# Patient Record
Sex: Male | Born: 1935 | Race: Black or African American | Hispanic: Yes | Marital: Married | Smoking: Former smoker
Health system: Southern US, Community
[De-identification: ages and names within clinical notes are randomized; demographics above are authoritative.]

## PROBLEM LIST (undated history)

## (undated) DIAGNOSIS — N529 Male erectile dysfunction, unspecified: Secondary | ICD-10-CM

## (undated) DIAGNOSIS — M1710 Unilateral primary osteoarthritis, unspecified knee: Secondary | ICD-10-CM

## (undated) DIAGNOSIS — I739 Peripheral vascular disease, unspecified: Secondary | ICD-10-CM

## (undated) DIAGNOSIS — Z9181 History of falling: Secondary | ICD-10-CM

## (undated) DIAGNOSIS — H5212 Myopia, left eye: Secondary | ICD-10-CM

## (undated) DIAGNOSIS — H524 Presbyopia: Secondary | ICD-10-CM

## (undated) DIAGNOSIS — N3941 Urge incontinence: Secondary | ICD-10-CM

## (undated) DIAGNOSIS — R7611 Nonspecific reaction to tuberculin skin test without active tuberculosis: Secondary | ICD-10-CM

## (undated) DIAGNOSIS — I5022 Chronic systolic (congestive) heart failure: Secondary | ICD-10-CM

## (undated) DIAGNOSIS — H40013 Open angle with borderline findings, low risk, bilateral: Secondary | ICD-10-CM

## (undated) DIAGNOSIS — E785 Hyperlipidemia, unspecified: Secondary | ICD-10-CM

## (undated) DIAGNOSIS — E0842 Diabetes mellitus due to underlying condition with diabetic polyneuropathy: Secondary | ICD-10-CM

## (undated) DIAGNOSIS — I10 Essential (primary) hypertension: Secondary | ICD-10-CM

## (undated) DIAGNOSIS — E118 Type 2 diabetes mellitus with unspecified complications: Principal | ICD-10-CM

## (undated) DIAGNOSIS — Z7409 Other reduced mobility: Secondary | ICD-10-CM

## (undated) DIAGNOSIS — E669 Obesity, unspecified: Secondary | ICD-10-CM

## (undated) DIAGNOSIS — R296 Repeated falls: Secondary | ICD-10-CM

## (undated) DIAGNOSIS — R269 Unspecified abnormalities of gait and mobility: Secondary | ICD-10-CM

## (undated) DIAGNOSIS — I351 Nonrheumatic aortic (valve) insufficiency: Secondary | ICD-10-CM

## (undated) DIAGNOSIS — E1165 Type 2 diabetes mellitus with hyperglycemia: Principal | ICD-10-CM

## (undated) DIAGNOSIS — H52229 Regular astigmatism, unspecified eye: Secondary | ICD-10-CM

## (undated) DIAGNOSIS — R809 Proteinuria, unspecified: Secondary | ICD-10-CM

## (undated) DIAGNOSIS — K223 Perforation of esophagus: Secondary | ICD-10-CM

## (undated) DIAGNOSIS — L97509 Non-pressure chronic ulcer of other part of unspecified foot with unspecified severity: Secondary | ICD-10-CM

## (undated) DIAGNOSIS — S335XXA Sprain of ligaments of lumbar spine, initial encounter: Secondary | ICD-10-CM

## (undated) DIAGNOSIS — G609 Hereditary and idiopathic neuropathy, unspecified: Secondary | ICD-10-CM

## (undated) DIAGNOSIS — M48061 Spinal stenosis, lumbar region without neurogenic claudication: Secondary | ICD-10-CM

## (undated) DIAGNOSIS — M4646 Discitis, unspecified, lumbar region: Secondary | ICD-10-CM

## (undated) DIAGNOSIS — E119 Type 2 diabetes mellitus without complications: Principal | ICD-10-CM

## (undated) DIAGNOSIS — R609 Edema, unspecified: Secondary | ICD-10-CM

## (undated) DIAGNOSIS — I5020 Unspecified systolic (congestive) heart failure: Secondary | ICD-10-CM

## (undated) DIAGNOSIS — R2689 Other abnormalities of gait and mobility: Principal | ICD-10-CM

## (undated) DIAGNOSIS — H02402 Unspecified ptosis of left eyelid: Secondary | ICD-10-CM

## (undated) DIAGNOSIS — K6812 Psoas muscle abscess: Principal | ICD-10-CM

## (undated) DIAGNOSIS — R39198 Other difficulties with micturition: Secondary | ICD-10-CM

## (undated) HISTORY — DX: Unspecified systolic (congestive) heart failure: I50.20

## (undated) HISTORY — DX: Sprain of ligaments of lumbar spine, initial encounter: S33.5XXA

## (undated) HISTORY — DX: Peripheral vascular disease, unspecified: I73.9

## (undated) HISTORY — DX: Nonspecific reaction to tuberculin skin test without active tuberculosis: R76.11

## (undated) HISTORY — DX: Male erectile dysfunction, unspecified: N52.9

## (undated) HISTORY — DX: Spinal stenosis, lumbar region without neurogenic claudication: M48.061

## (undated) HISTORY — DX: Unspecified ptosis of left eyelid: H02.402

## (undated) HISTORY — DX: Hyperlipidemia, unspecified: E78.5

## (undated) HISTORY — DX: Open angle with borderline findings, low risk, bilateral: H40.013

## (undated) HISTORY — DX: Type 2 diabetes mellitus with hyperglycemia: E11.65

## (undated) HISTORY — DX: Other reduced mobility: Z74.09

## (undated) HISTORY — DX: Chronic systolic (congestive) heart failure: I50.22

## (undated) HISTORY — DX: Type 2 diabetes mellitus without complications: E11.9

## (undated) HISTORY — DX: Regular astigmatism, unspecified eye: H52.229

## (undated) HISTORY — DX: Other difficulties with micturition: R39.198

## (undated) HISTORY — DX: Nonrheumatic aortic (valve) insufficiency: I35.1

## (undated) HISTORY — DX: Obesity, unspecified: E66.9

## (undated) HISTORY — DX: Proteinuria, unspecified: R80.9

## (undated) HISTORY — DX: Type 2 diabetes mellitus with unspecified complications: E11.8

## (undated) HISTORY — DX: Edema, unspecified: R60.9

## (undated) HISTORY — DX: Essential (primary) hypertension: I10

## (undated) HISTORY — DX: Unilateral primary osteoarthritis, unspecified knee: M17.10

## (undated) HISTORY — DX: Discitis, unspecified, lumbar region: M46.46

## (undated) HISTORY — DX: Presbyopia: H52.4

## (undated) HISTORY — DX: Other abnormalities of gait and mobility: R26.89

## (undated) HISTORY — DX: Non-pressure chronic ulcer of other part of unspecified foot with unspecified severity: L97.509

## (undated) HISTORY — DX: History of falling: Z91.81

## (undated) HISTORY — PX: CATARACT EXTRACTION: SUR2

## (undated) HISTORY — DX: Urge incontinence: N39.41

## (undated) HISTORY — DX: Diabetes mellitus due to underlying condition with diabetic polyneuropathy: E08.42

## (undated) HISTORY — DX: Repeated falls: R29.6

## (undated) HISTORY — DX: Hereditary and idiopathic neuropathy, unspecified: G60.9

## (undated) HISTORY — DX: Perforation of esophagus: K22.3

## (undated) HISTORY — DX: Myopia, left eye: H52.12

## (undated) HISTORY — DX: Psoas muscle abscess: K68.12

## (undated) HISTORY — DX: Unspecified abnormalities of gait and mobility: R26.9

---

## 1991-06-11 DIAGNOSIS — R7611 Nonspecific reaction to tuberculin skin test without active tuberculosis: Secondary | ICD-10-CM

## 1991-06-11 DIAGNOSIS — K223 Perforation of esophagus: Secondary | ICD-10-CM

## 1991-06-11 HISTORY — PX: THORACOTOMY: SUR1349

## 1991-06-11 HISTORY — PX: SPINAL CORD DECOMPRESSION: SHX97

## 1991-06-11 HISTORY — PX: NECK SURGERY: SHX720

## 1991-06-11 HISTORY — DX: Perforation of esophagus: K22.3

## 1991-06-11 HISTORY — PX: OTHER SURGICAL HISTORY: SHX169

## 1991-06-11 HISTORY — DX: Nonspecific reaction to tuberculin skin test without active tuberculosis: R76.11

## 1991-06-11 HISTORY — PX: JEJUNOSTOMY FEEDING TUBE: SUR737

## 1997-11-03 ENCOUNTER — Encounter: Admission: RE | Admit: 1997-11-03 | Discharge: 1997-11-03 | Payer: Self-pay | Admitting: Family Medicine

## 1997-11-29 ENCOUNTER — Encounter: Admission: RE | Admit: 1997-11-29 | Discharge: 1997-11-29 | Payer: Self-pay | Admitting: Family Medicine

## 1997-12-01 ENCOUNTER — Encounter: Admission: RE | Admit: 1997-12-01 | Discharge: 1997-12-01 | Payer: Self-pay | Admitting: Family Medicine

## 1998-03-14 ENCOUNTER — Encounter: Admission: RE | Admit: 1998-03-14 | Discharge: 1998-03-14 | Payer: Self-pay | Admitting: Family Medicine

## 1998-10-19 ENCOUNTER — Encounter: Admission: RE | Admit: 1998-10-19 | Discharge: 1998-10-19 | Payer: Self-pay | Admitting: Family Medicine

## 1999-04-06 ENCOUNTER — Encounter: Admission: RE | Admit: 1999-04-06 | Discharge: 1999-04-06 | Payer: Self-pay | Admitting: Sports Medicine

## 1999-06-07 ENCOUNTER — Encounter: Admission: RE | Admit: 1999-06-07 | Discharge: 1999-06-07 | Payer: Self-pay | Admitting: Family Medicine

## 2000-04-08 ENCOUNTER — Encounter: Admission: RE | Admit: 2000-04-08 | Discharge: 2000-04-08 | Payer: Self-pay | Admitting: Family Medicine

## 2000-07-07 ENCOUNTER — Encounter: Admission: RE | Admit: 2000-07-07 | Discharge: 2000-07-07 | Payer: Self-pay | Admitting: Family Medicine

## 2001-04-01 ENCOUNTER — Encounter: Admission: RE | Admit: 2001-04-01 | Discharge: 2001-04-01 | Payer: Self-pay | Admitting: Family Medicine

## 2001-04-08 ENCOUNTER — Encounter: Admission: RE | Admit: 2001-04-08 | Discharge: 2001-04-08 | Payer: Self-pay | Admitting: Family Medicine

## 2001-08-03 ENCOUNTER — Encounter: Admission: RE | Admit: 2001-08-03 | Discharge: 2001-08-03 | Payer: Self-pay | Admitting: Family Medicine

## 2001-08-14 ENCOUNTER — Encounter: Admission: RE | Admit: 2001-08-14 | Discharge: 2001-11-12 | Payer: Self-pay | Admitting: Infectious Diseases

## 2001-08-20 ENCOUNTER — Encounter: Admission: RE | Admit: 2001-08-20 | Discharge: 2001-08-20 | Payer: Self-pay | Admitting: Family Medicine

## 2002-03-22 ENCOUNTER — Encounter: Admission: RE | Admit: 2002-03-22 | Discharge: 2002-03-22 | Payer: Self-pay | Admitting: Family Medicine

## 2002-10-21 ENCOUNTER — Encounter: Admission: RE | Admit: 2002-10-21 | Discharge: 2002-10-21 | Payer: Self-pay | Admitting: Family Medicine

## 2003-04-06 ENCOUNTER — Encounter: Admission: RE | Admit: 2003-04-06 | Discharge: 2003-04-06 | Payer: Self-pay | Admitting: Family Medicine

## 2003-10-20 ENCOUNTER — Encounter: Admission: RE | Admit: 2003-10-20 | Discharge: 2003-10-20 | Payer: Self-pay | Admitting: Family Medicine

## 2004-03-26 ENCOUNTER — Ambulatory Visit: Payer: Self-pay | Admitting: Sports Medicine

## 2004-04-05 ENCOUNTER — Ambulatory Visit: Payer: Self-pay | Admitting: Family Medicine

## 2005-01-24 ENCOUNTER — Ambulatory Visit: Payer: Self-pay | Admitting: Family Medicine

## 2005-04-22 ENCOUNTER — Ambulatory Visit: Payer: Self-pay | Admitting: Family Medicine

## 2006-02-24 ENCOUNTER — Ambulatory Visit: Payer: Self-pay | Admitting: Family Medicine

## 2006-08-07 DIAGNOSIS — G609 Hereditary and idiopathic neuropathy, unspecified: Secondary | ICD-10-CM

## 2006-08-07 DIAGNOSIS — N529 Male erectile dysfunction, unspecified: Secondary | ICD-10-CM | POA: Insufficient documentation

## 2006-08-07 DIAGNOSIS — E669 Obesity, unspecified: Secondary | ICD-10-CM | POA: Insufficient documentation

## 2006-08-07 DIAGNOSIS — R809 Proteinuria, unspecified: Secondary | ICD-10-CM | POA: Insufficient documentation

## 2006-08-07 DIAGNOSIS — I10 Essential (primary) hypertension: Secondary | ICD-10-CM

## 2006-08-07 DIAGNOSIS — E0842 Diabetes mellitus due to underlying condition with diabetic polyneuropathy: Secondary | ICD-10-CM

## 2006-08-07 DIAGNOSIS — E119 Type 2 diabetes mellitus without complications: Secondary | ICD-10-CM

## 2006-08-07 DIAGNOSIS — E7800 Pure hypercholesterolemia, unspecified: Secondary | ICD-10-CM | POA: Insufficient documentation

## 2006-08-07 DIAGNOSIS — IMO0002 Reserved for concepts with insufficient information to code with codable children: Secondary | ICD-10-CM

## 2006-08-07 DIAGNOSIS — E785 Hyperlipidemia, unspecified: Secondary | ICD-10-CM

## 2006-08-07 DIAGNOSIS — E1165 Type 2 diabetes mellitus with hyperglycemia: Secondary | ICD-10-CM

## 2006-08-07 DIAGNOSIS — M1710 Unilateral primary osteoarthritis, unspecified knee: Secondary | ICD-10-CM | POA: Insufficient documentation

## 2006-08-07 HISTORY — DX: Type 2 diabetes mellitus with hyperglycemia: E11.65

## 2006-08-07 HISTORY — DX: Diabetes mellitus due to underlying condition with diabetic polyneuropathy: E08.42

## 2006-08-07 HISTORY — DX: Reserved for concepts with insufficient information to code with codable children: IMO0002

## 2006-08-07 HISTORY — DX: Type 2 diabetes mellitus without complications: E11.9

## 2006-08-07 HISTORY — DX: Proteinuria, unspecified: R80.9

## 2006-08-07 HISTORY — DX: Essential (primary) hypertension: I10

## 2006-08-07 HISTORY — DX: Obesity, unspecified: E66.9

## 2006-08-07 HISTORY — DX: Male erectile dysfunction, unspecified: N52.9

## 2006-08-07 HISTORY — DX: Hereditary and idiopathic neuropathy, unspecified: G60.9

## 2006-08-07 HISTORY — DX: Hyperlipidemia, unspecified: E78.5

## 2007-01-02 ENCOUNTER — Encounter: Payer: Self-pay | Admitting: Family Medicine

## 2007-03-12 ENCOUNTER — Ambulatory Visit: Payer: Self-pay | Admitting: Family Medicine

## 2007-03-12 LAB — CONVERTED CEMR LAB

## 2007-03-13 DIAGNOSIS — N183 Chronic kidney disease, stage 3 unspecified: Secondary | ICD-10-CM | POA: Insufficient documentation

## 2007-03-13 LAB — CONVERTED CEMR LAB
BUN: 20 mg/dL (ref 6–23)
Calcium: 9.3 mg/dL (ref 8.4–10.5)

## 2007-10-27 ENCOUNTER — Telehealth: Payer: Self-pay | Admitting: Family Medicine

## 2007-10-29 ENCOUNTER — Telehealth: Payer: Self-pay | Admitting: Family Medicine

## 2008-01-29 ENCOUNTER — Telehealth: Payer: Self-pay | Admitting: *Deleted

## 2008-03-04 ENCOUNTER — Telehealth: Payer: Self-pay | Admitting: *Deleted

## 2008-03-04 ENCOUNTER — Ambulatory Visit: Payer: Self-pay | Admitting: Family Medicine

## 2008-03-25 ENCOUNTER — Telehealth: Payer: Self-pay | Admitting: Family Medicine

## 2008-04-04 ENCOUNTER — Ambulatory Visit: Payer: Self-pay | Admitting: Family Medicine

## 2008-04-04 LAB — CONVERTED CEMR LAB
Creatinine, Ser: 1.43 mg/dL (ref 0.40–1.50)
Hgb A1c MFr Bld: 6.8 %
Potassium: 4.7 meq/L (ref 3.5–5.3)
Sodium: 139 meq/L (ref 135–145)
Total CHOL/HDL Ratio: 2.8
VLDL: 24 mg/dL (ref 0–40)

## 2008-04-05 ENCOUNTER — Encounter: Payer: Self-pay | Admitting: Family Medicine

## 2008-04-29 ENCOUNTER — Telehealth (INDEPENDENT_AMBULATORY_CARE_PROVIDER_SITE_OTHER): Payer: Self-pay | Admitting: Family Medicine

## 2008-06-10 HISTORY — PX: LUMBAR EPIDURAL INJECTION: SHX1980

## 2008-06-29 ENCOUNTER — Encounter: Payer: Self-pay | Admitting: Family Medicine

## 2008-07-19 ENCOUNTER — Ambulatory Visit: Payer: Self-pay | Admitting: Family Medicine

## 2008-07-19 ENCOUNTER — Telehealth: Payer: Self-pay | Admitting: Family Medicine

## 2008-07-19 DIAGNOSIS — S335XXA Sprain of ligaments of lumbar spine, initial encounter: Secondary | ICD-10-CM

## 2008-07-19 HISTORY — DX: Sprain of ligaments of lumbar spine, initial encounter: S33.5XXA

## 2008-08-01 ENCOUNTER — Telehealth: Payer: Self-pay | Admitting: Family Medicine

## 2008-08-01 ENCOUNTER — Ambulatory Visit: Payer: Self-pay | Admitting: Family Medicine

## 2008-08-01 DIAGNOSIS — R269 Unspecified abnormalities of gait and mobility: Secondary | ICD-10-CM

## 2008-08-01 DIAGNOSIS — R609 Edema, unspecified: Secondary | ICD-10-CM

## 2008-08-01 HISTORY — DX: Edema, unspecified: R60.9

## 2008-08-01 HISTORY — DX: Unspecified abnormalities of gait and mobility: R26.9

## 2008-08-01 LAB — CONVERTED CEMR LAB
ALT: 18 units/L (ref 0–53)
Albumin: 4.1 g/dL (ref 3.5–5.2)
Alkaline Phosphatase: 63 units/L (ref 39–117)
BUN: 21 mg/dL (ref 6–23)
Calcium: 8.7 mg/dL (ref 8.4–10.5)
Chloride: 104 meq/L (ref 96–112)
Creatinine, Ser: 1.54 mg/dL — ABNORMAL HIGH (ref 0.40–1.50)
Glucose, Bld: 177 mg/dL — ABNORMAL HIGH (ref 70–99)
HCT: 43 % (ref 39.0–52.0)
MCHC: 34 g/dL (ref 30.0–36.0)
MCV: 81.3 fL (ref 78.0–100.0)
PSA: 0.61 ng/mL (ref 0.10–4.00)
Platelets: 147 10*3/uL — ABNORMAL LOW (ref 150–400)
Potassium: 4.3 meq/L (ref 3.5–5.3)
RBC: 5.29 M/uL (ref 4.22–5.81)
RDW: 14.7 % (ref 11.5–15.5)
Sed Rate: 4 mm/hr (ref 0–16)
Total Bilirubin: 0.7 mg/dL (ref 0.3–1.2)
Total Protein: 6.6 g/dL (ref 6.0–8.3)

## 2008-08-02 ENCOUNTER — Telehealth: Payer: Self-pay | Admitting: *Deleted

## 2008-08-03 ENCOUNTER — Telehealth: Payer: Self-pay | Admitting: Family Medicine

## 2008-08-04 ENCOUNTER — Ambulatory Visit: Payer: Self-pay | Admitting: Family Medicine

## 2008-09-15 ENCOUNTER — Telehealth: Payer: Self-pay | Admitting: Family Medicine

## 2009-02-14 ENCOUNTER — Ambulatory Visit: Payer: Self-pay | Admitting: Family Medicine

## 2009-02-14 ENCOUNTER — Encounter: Payer: Self-pay | Admitting: Family Medicine

## 2009-02-17 ENCOUNTER — Encounter: Admission: RE | Admit: 2009-02-17 | Discharge: 2009-02-17 | Payer: Self-pay | Admitting: Family Medicine

## 2009-02-20 ENCOUNTER — Telehealth: Payer: Self-pay | Admitting: Family Medicine

## 2009-02-22 ENCOUNTER — Telehealth: Payer: Self-pay | Admitting: Family Medicine

## 2009-02-24 ENCOUNTER — Telehealth: Payer: Self-pay | Admitting: Family Medicine

## 2009-03-03 ENCOUNTER — Encounter: Admission: RE | Admit: 2009-03-03 | Discharge: 2009-03-03 | Payer: Self-pay | Admitting: Family Medicine

## 2009-03-06 ENCOUNTER — Telehealth: Payer: Self-pay | Admitting: Family Medicine

## 2009-03-14 ENCOUNTER — Encounter: Payer: Self-pay | Admitting: Family Medicine

## 2009-03-17 ENCOUNTER — Encounter: Admission: RE | Admit: 2009-03-17 | Discharge: 2009-03-17 | Payer: Self-pay | Admitting: Family Medicine

## 2009-03-30 ENCOUNTER — Telehealth: Payer: Self-pay | Admitting: Family Medicine

## 2009-04-27 ENCOUNTER — Encounter: Admission: RE | Admit: 2009-04-27 | Discharge: 2009-04-27 | Payer: Self-pay | Admitting: Family Medicine

## 2009-05-01 ENCOUNTER — Ambulatory Visit: Payer: Self-pay | Admitting: Family Medicine

## 2009-05-01 LAB — CONVERTED CEMR LAB
Alkaline Phosphatase: 66 units/L (ref 39–117)
BUN: 21 mg/dL (ref 6–23)
Calcium: 9.3 mg/dL (ref 8.4–10.5)
Chloride: 101 meq/L (ref 96–112)
Creatinine, Ser: 1.37 mg/dL (ref 0.40–1.50)
Potassium: 4.2 meq/L (ref 3.5–5.3)
Sodium: 139 meq/L (ref 135–145)

## 2009-05-02 ENCOUNTER — Encounter: Payer: Self-pay | Admitting: Family Medicine

## 2009-06-16 ENCOUNTER — Ambulatory Visit: Payer: Self-pay | Admitting: Family Medicine

## 2009-06-17 ENCOUNTER — Encounter: Payer: Self-pay | Admitting: Family Medicine

## 2009-06-27 ENCOUNTER — Ambulatory Visit: Payer: Self-pay | Admitting: Family Medicine

## 2009-07-04 ENCOUNTER — Ambulatory Visit: Payer: Self-pay | Admitting: Family Medicine

## 2009-09-13 ENCOUNTER — Telehealth: Payer: Self-pay | Admitting: Family Medicine

## 2010-01-01 ENCOUNTER — Telehealth: Payer: Self-pay | Admitting: Family Medicine

## 2010-04-02 ENCOUNTER — Ambulatory Visit: Payer: Self-pay | Admitting: Family Medicine

## 2010-04-02 ENCOUNTER — Telehealth: Payer: Self-pay | Admitting: Family Medicine

## 2010-04-02 LAB — CONVERTED CEMR LAB
ALT: 15 units/L (ref 0–53)
AST: 16 units/L (ref 0–37)
Albumin: 3.7 g/dL (ref 3.5–5.2)
Anti Nuclear Antibody(ANA): NEGATIVE
BUN: 25 mg/dL — ABNORMAL HIGH (ref 6–23)
Beta Globulin: 5 % (ref 4.7–7.2)
Creatinine, Ser: 1.28 mg/dL (ref 0.40–1.50)
Glucose, Bld: 162 mg/dL — ABNORMAL HIGH (ref 70–99)
Hgb A1c MFr Bld: 5.2 %
Potassium: 4.4 meq/L (ref 3.5–5.3)
Total Bilirubin: 0.7 mg/dL (ref 0.3–1.2)
Vitamin B-12: 1164 pg/mL — ABNORMAL HIGH (ref 211–911)

## 2010-04-03 ENCOUNTER — Encounter: Payer: Self-pay | Admitting: Family Medicine

## 2010-04-05 ENCOUNTER — Telehealth: Payer: Self-pay | Admitting: Family Medicine

## 2010-05-14 ENCOUNTER — Ambulatory Visit: Payer: Self-pay | Admitting: Family Medicine

## 2010-05-18 ENCOUNTER — Telehealth: Payer: Self-pay | Admitting: Family Medicine

## 2010-06-14 ENCOUNTER — Encounter: Payer: Self-pay | Admitting: Family Medicine

## 2010-06-18 ENCOUNTER — Encounter: Payer: Self-pay | Admitting: Family Medicine

## 2010-06-26 ENCOUNTER — Encounter
Admission: RE | Admit: 2010-06-26 | Discharge: 2010-07-10 | Payer: Self-pay | Source: Home / Self Care | Attending: Family Medicine | Admitting: Family Medicine

## 2010-07-10 NOTE — Progress Notes (Signed)
Summary: Check-in about tolerance to gabapentin, discuss abnormal TSH.  Phone Note Outgoing Call   Call placed by: Tawanna Cooler McDiarmid MD,  April 05, 2010 4:37 PM Call placed to: Patient Summary of Call: Carlos Andrews says he is tolerating the gabapentin at one tablet at night, but because of the way it makes him feel, he has not increased beyond one tablet at night.  He reports the pins and needles sensations in his legs is improved.  We discussed the slightly low TSH and that I will want to check some other Thyroid tests when he comes in for his next OV with me.  He was agreeable to this additional testing.  Initial call taken by: Tawanna Cooler McDiarmid MD,  April 05, 2010 4:40 PM

## 2010-07-10 NOTE — Assessment & Plan Note (Signed)
Summary: ABIs - Rx Clinic   Vital Signs:  Patient profile:   77 year old male Height:      65.5 inches Weight:      172 pounds BMI:     28.29 Pulse rate:   87 / minute BP sitting:   170 / 98  (right arm)  Primary Care Provider:  Tawanna Cooler McDiarmid MD   History of Present Illness: 77 yo AAM reports to Rx clinic with his wife for ABIs.  Recently seen for diabetic right toe ulcer and referred.    Pt and wife are able to give detailed history of medications.  Reports using his OneTouch meter until recently, which became defective and switched to Relion meter system.  Wife presented glucose log dated from 1/8 until this morning.  Has been adjusting glimepiride between 1/2 to 1 tablet two times a day based on daily CBGs.      Current Medications (verified): 1)  Aspirin Ec 81 Mg Tbec (Aspirin) .... Take 1 Tablet By Mouth Once A Day 2)  Clonidine Hcl 0.2 Mg Tabs (Clonidine Hcl) .... Take 1 Tablet By Mouth Twice A Day 3)  Glimepiride 2 Mg Tabs (Glimepiride) .... Take 1/2 Tablet By Mouth Twice A Day 4)  Lisinopril 20 Mg Tabs (Lisinopril) .... Take 1 Tablet By Mouth At Bedtime 5)  Relion Confirm Glucose Monitor W/device Kit (Blood Glucose Monitoring Suppl) .... Check Blood Sugar Twice A Day.  Indication: 250.90 6)  Relion Alcohol Swabs  Pads (Alcohol Swabs) .... Check Blood Sugar Twice Daily. Indication: 250.92 Disp:38-Month Supply Refill: Prn 7)  Relion Lancets Thin 26g  Misc (Lancets) .... Check Blood Sugar Twice A Day. Disp: 38-Month Supply. Refill: Prn 8)  Daily Multiple Vitamins  Tabs (Multiple Vitamin) .... Once Daily 9)  Vitamin B-12 1000 Mcg Tabs (Cyanocobalamin) .... Once Daily 10)  Vitamin B-6 100 Mg Tabs (Pyridoxine Hcl) .... 3 Once Daily 11)  Vitamin C 500 Mg Chew (Ascorbic Acid) .... One Daily 12)  Vitamin D 400 Unit Caps (Cholecalciferol) .... Once Daily 13)  Natural Vitamin E 100 Unit Caps (Vitamin E) .... Once Daily 14)  Fish Oil   Oil (Fish Oil) .Marland Kitchen.. 1 Once Daily 15)  Potassium  Gluconate 595 Mg Tabs (Potassium Gluconate) .... 1/2 Pill Once Daily  Allergies (verified): 1)  Vioxx 2)  Tramadol Hcl (Tramadol Hcl)  Physical Exam  Extremities:  Lower extremity Physical Exam includes: Cool to palpation, diminished pulses, absence of limb hair, current ulceration (right toe ulcer), pallor (edges of toe ulcer white in appearance), thickened brittle nails.  Left / Right / Both ABI overall = >1: Right Arm:      Left Arm:   Right ankle posterior tibial:        dorsalis pedis:    Left ankle posterior tibial:        dorsalis pedis:      Impression & Recommendations:  Problem # 1:  DIABETIC FOOT ULCER, RIGHT (ICD-250.80) Assessment Unchanged  Moderate PAD based on ABI > 1, suggesting athlesclerosis/calcification.  Right dorsalis pedis pressure concerning for proper healing of right toe ulcer.  AM fasting CBGs 140-200.  Noon fasting CBGs 85-240, dinnertime CBGs 130-220.  Pt has been adjusting glimepiride based on CBG readings.  Increased current diabetic regimen to glimepiride 1 tablet twice daily, for aggressive mgmt of CBGs, given his current toe ulceration.  Luretha Murphy was invited to participate in reevaluation of local wound and care.  .  F/U  Clinic Visit with Luretha Murphy FNP.  Total time with patient in face-to-face counseling:  60 minutes.  Patient seen with: Eda Keys PharmD and Massie Bougie PharmD Candidate.  His updated medication list for this problem includes:    Aspirin Ec 81 Mg Tbec (Aspirin) .Marland Kitchen... Take 1 tablet by mouth once a day    Glimepiride 2 Mg Tabs (Glimepiride) .Marland Kitchen... Take 1/2 tablet by mouth twice a day    Lisinopril 20 Mg Tabs (Lisinopril) .Marland Kitchen... Take 1 tablet by mouth at bedtime  Orders: Inital Assessment Each - [facility] (66063)  Problem # 2:  HYPERTENSION, BENIGN SYSTEMIC (ICD-401.1) Assessment: Deteriorated Currently not at goal.  Possibly related to current wound infection.  Recheck next visit.     Problem # 3:  HYPERLIPIDEMIA (ICD-272.4) Assessment: Comment Only Reevalutate at next blood draw.  Not currently on therapy but could be started given findings on ABI and presence of Diabetes.   Complete Medication List: 1)  Aspirin Ec 81 Mg Tbec (Aspirin) .... Take 1 tablet by mouth once a day 2)  Clonidine Hcl 0.2 Mg Tabs (Clonidine hcl) .... Take 1 tablet by mouth twice a day 3)  Glimepiride 2 Mg Tabs (Glimepiride) .... Take 1/2 tablet by mouth twice a day 4)  Lisinopril 20 Mg Tabs (Lisinopril) .... Take 1 tablet by mouth at bedtime 5)  Relion Confirm Glucose Monitor W/device Kit (Blood glucose monitoring suppl) .... Check blood sugar twice a day.  indication: 250.90 6)  Relion Alcohol Swabs Pads (Alcohol swabs) .... Check blood sugar twice daily. indication: 250.92 disp:77-month supply refill: prn 7)  Relion Lancets Thin 26g Misc (Lancets) .... Check blood sugar twice a day. disp: 59-month supply. refill: prn 8)  Daily Multiple Vitamins Tabs (Multiple vitamin) .... Once daily 9)  Vitamin B-12 1000 Mcg Tabs (Cyanocobalamin) .... Once daily 10)  Vitamin B-6 100 Mg Tabs (Pyridoxine hcl) .... 3 once daily 11)  Vitamin C 500 Mg Chew (Ascorbic acid) .... One daily 12)  Vitamin D 400 Unit Caps (Cholecalciferol) .... Once daily 13)  Natural Vitamin E 100 Unit Caps (Vitamin e) .... Once daily 14)  Fish Oil Oil (Fish oil) .Marland Kitchen.. 1 once daily 15)  Potassium Gluconate 595 Mg Tabs (Potassium gluconate) .... 1/2 pill once daily  Patient Instructions: 1)  The ABI test today demonstrates that you have some "hardening of the arteries" atherosclerosis of the leg arteries.   The Blood Flow to your right foot on the top is diminished.  2)  Keep up the great work on keeping your blood sugars under control.  Increase your glimepiride from 1/2 to full pill  if your blood sugar is igher than 150 in the morning.  3)  Follow-Up Wound on Right Toe next week with Luretha Murphy.  Prescriptions: POTASSIUM GLUCONATE  595 MG TABS (POTASSIUM GLUCONATE) 1/2 pill once daily  #1 x 0   Entered and Authorized by:   Christian Mate D   Signed by:   Madelon Lips Pharm D on 06/27/2009   Method used:   Historical   RxID:   [id number] FISH OIL   OIL (FISH OIL) 1 once daily  #1 x 0   Entered and Authorized by:   Christian Mate D   Signed by:   Madelon Lips Pharm D on 06/27/2009   Method used:   Historical   RxID:   [id number] NATURAL VITAMIN E 100 UNIT CAPS (VITAMIN E) once daily  #1 x 0   Entered and Authorized by:  Madelon Lips Pharm D   Signed by:   Madelon Lips Pharm D on 06/27/2009   Method used:   Historical   RxID:   [id number] VITAMIN D 400 UNIT CAPS (CHOLECALCIFEROL) once daily  #1 x 0   Entered and Authorized by:   Christian Mate D   Signed by:   Madelon Lips Pharm D on 06/27/2009   Method used:   Historical   RxID:   [id number] VITAMIN C 500 MG CHEW (ASCORBIC ACID) one daily  #1 x 0   Entered and Authorized by:   Christian Mate D   Signed by:   Madelon Lips Pharm D on 06/27/2009   Method used:   Historical   RxID:   [id number] VITAMIN B-6 100 MG TABS (PYRIDOXINE HCL) 3 once daily  #1 x 0   Entered and Authorized by:   Christian Mate D   Signed by:   Madelon Lips Pharm D on 06/27/2009   Method used:   Historical   RxID:   [id number] VITAMIN B-12 1000 MCG TABS (CYANOCOBALAMIN) once daily  #1 x 0   Entered and Authorized by:   Christian Mate D   Signed by:   Madelon Lips Pharm D on 06/27/2009   Method used:   Historical   RxID:   [id number] DAILY MULTIPLE VITAMINS  TABS (MULTIPLE VITAMIN) once daily  #1 x 0   Entered and Authorized by:   Christian Mate D   Signed by:   Madelon Lips Pharm D on 06/27/2009   Method used:   Historical   RxID:   [id number]   Prevention & Chronic Care Immunizations   Influenza vaccine: Fluvax MCR  (03/04/2008)   Influenza vaccine due: 03/04/2009    Tetanus booster: 04/04/2008: refused   Tetanus  booster due: 04/04/2018    Pneumococcal vaccine: Done.  (05/11/1999)   Pneumococcal vaccine due: None    H. zoster vaccine: 04/04/2008: refused  Colorectal Screening   Hemoccult: refused  (04/04/2008)   Hemoccult due: 04/04/2009    Colonoscopy: refused  (04/04/2008)   Colonoscopy due: 04/04/2018  Other Screening   PSA: 0.61  (08/01/2008)   PSA due due: Not Indicated   Smoking status: quit > 6 months  (06/16/2009)  Diabetes Mellitus   HgbA1C: 8.0  (06/16/2009)   Hemoglobin A1C due: 06/12/2007    Eye exam: refused  (04/04/2008)   Eye exam due: 04/04/2009    Foot exam: abnormal  (04/04/2008)   High risk foot: Not documented   Foot care education: Not documented   Foot exam due: 04/04/2009    Urine microalbumin/creatinine ratio: Not documented   Urine microalbumin/cr due: 04/04/2009    Diabetes flowsheet reviewed?: Yes   Progress toward A1C goal: Improved  Lipids   Total Cholesterol: 170  (04/04/2008)   LDL: 85  (04/04/2008)   LDL Direct: Not documented   HDL: 61  (04/04/2008)   Triglycerides: 122  (04/04/2008)    SGOT (AST): 18  (05/01/2009)   SGPT (ALT): 21  (05/01/2009)   Alkaline phosphatase: 66  (05/01/2009)   Total bilirubin: 1.0  (05/01/2009)    Lipid flowsheet reviewed?: Yes   Progress toward LDL goal: At goal  Hypertension   Last Blood Pressure: 170 / 98  (06/27/2009)   Serum creatinine: 1.37  (05/01/2009)   Serum potassium 4.2  (05/01/2009)    Hypertension flowsheet reviewed?: Yes   Progress toward BP goal: Deteriorated  Self-Management Support :   Personal Goals (by the next  clinic visit) :     Personal A1C goal: 8  (06/27/2009)     Personal blood pressure goal: 140/90  (06/27/2009)     Personal LDL goal: 100  (06/27/2009)    Diabetes self-management support: Not documented    Hypertension self-management support: Not documented    Lipid self-management support: Not documented

## 2010-07-10 NOTE — Progress Notes (Signed)
Summary: refill  Phone Note Refill Request Call back at Home Phone [phone] Message from:  Patient  needs test strips Relion Confirm [facility] [address]  Initial call taken by: De Nurse,  April 02, 2010 1:48 PM    New/Updated Medications: RELION BLOOD GLUCOSE TEST  STRP (GLUCOSE BLOOD) Test blood sugar once a day Prescriptions: RELION BLOOD GLUCOSE TEST  STRP (GLUCOSE BLOOD) Test blood sugar once a day  #30 x 12   Entered and Authorized by:   Tawanna Cooler Nicolaus Andel MD   Signed by:   Tawanna Cooler Billiejean Schimek MD on 04/02/2010   Method used:   Electronically to        [facility] [phone]* (retail)       [address]       [place], [place]  [address]       Ph: [phone]       Fax: [phone]   RxID:   [id number]

## 2010-07-10 NOTE — Assessment & Plan Note (Signed)
Summary: f/u wound on toe/per koval/eo   Vital Signs:  Patient profile:   77 year old male Height:      65.5 inches Weight:      170 pounds BMI:     27.96 Temp:     97.9 degrees F oral Pulse rate:   89 / minute BP sitting:   162 / 86  (right arm) Cuff size:   regular  Vitals Entered By: Tessie Fass, CMA CC: F/U toe wound Is Patient Diabetic? Yes Pain Assessment Patient in pain? no        Primary Care Provider:  Tawanna Cooler McDiarmid MD  CC:  F/U toe wound.  History of Present Illness: One week follow up for diabetic pressure ulceration on top of left great toe.  This is week 3 of hydrocolloid dressing and pressure relief.  Cultures grew out Serratis, but was never systemically treated as wound has never appeared to be infected.    ABI done last week indicative of arterial insufficiency.  Has night time foot pain when laying flat.  Habits & Providers  Alcohol-Tobacco-Diet     Tobacco Status: quit  Allergies: 1)  Vioxx 2)  Tramadol Hcl (Tramadol Hcl)  Social History: Smoking Status:  quit  Physical Exam  General:  Usual appearing Skin:  Ulceration down to 1/2cm in diameter, able to debreid necrotic tissue from around the circ. of the wound, had a small amount of bleeding that was stopped with pressure and silver nitrate application.   Impression & Recommendations:  Problem # 1:  DIABETIC FOOT ULCER, RIGHT (ICD-250.80)  Continuing to heal, feel that in 1-2 weeks will have complete resolution.  They requested that wife do dressings at home since co-pays and charges are piling up.  Gave her 3 pieces cut to size of hydrocolloid dressing, she will apply every 5-7 days until wound is closed and will come if this does not close as expected, or if there are signs of infection.  Follow up with primary MD in1 month. His updated medication list for this problem includes:    Aspirin Ec 81 Mg Tbec (Aspirin) .Marland Kitchen... Take 1 tablet by mouth once a day    Glimepiride 2 Mg Tabs  (Glimepiride) .Marland Kitchen... Take 1/2 tablet by mouth twice a day    Lisinopril 20 Mg Tabs (Lisinopril) .Marland Kitchen... Take 1 tablet by mouth at bedtime  Orders: [facility]- Est Level  3 (45409)  Complete Medication List: 1)  Aspirin Ec 81 Mg Tbec (Aspirin) .... Take 1 tablet by mouth once a day 2)  Clonidine Hcl 0.2 Mg Tabs (Clonidine hcl) .... Take 1 tablet by mouth twice a day 3)  Glimepiride 2 Mg Tabs (Glimepiride) .... Take 1/2 tablet by mouth twice a day 4)  Lisinopril 20 Mg Tabs (Lisinopril) .... Take 1 tablet by mouth at bedtime 5)  Relion Confirm Glucose Monitor W/device Kit (Blood glucose monitoring suppl) .... Check blood sugar twice a day.  indication: 250.90 6)  Relion Alcohol Swabs Pads (Alcohol swabs) .... Check blood sugar twice daily. indication: 250.92 disp:41-month supply refill: prn 7)  Relion Lancets Thin 26g Misc (Lancets) .... Check blood sugar twice a day. disp: 57-month supply. refill: prn 8)  Daily Multiple Vitamins Tabs (Multiple vitamin) .... Once daily 9)  Vitamin B-12 1000 Mcg Tabs (Cyanocobalamin) .... Once daily 10)  Vitamin B-6 100 Mg Tabs (Pyridoxine hcl) .... 3 once daily 11)  Vitamin C 500 Mg Chew (Ascorbic acid) .... One daily 12)  Vitamin D 400 Unit Caps (Cholecalciferol) .Marland KitchenMarland KitchenMarland Kitchen  Once daily 13)  Natural Vitamin E 100 Unit Caps (Vitamin e) .... Once daily 14)  Fish Oil Oil (Fish oil) .Marland Kitchen.. 1 once daily 15)  Potassium Gluconate 595 Mg Tabs (Potassium gluconate) .... 1/2 pill once daily  Patient Instructions: 1)  Please schedule a follow-up appointment in 1 month.  2)  Replace hydrocoloid every 5-7 days until healed 3)  Return for any signs of infection or increase in side of wound.

## 2010-07-10 NOTE — Assessment & Plan Note (Signed)
Summary: dm with sore on toe,df   Vital Signs:  Patient profile:   77 year old male Weight:      166.5 pounds Pulse rate:   84 / minute BP sitting:   138 / 80  (right arm)  Vitals Entered By: Arlyss Repress CMA, (June 16, 2009 1:37 PM) CC: right big toe check.  Is Patient Diabetic? Yes Pain Assessment Patient in pain? no        Primary Care Provider:  Tawanna Cooler McDiarmid MD  CC:  right big toe check. .  History of Present Illness: CC: right toe ulcer  77 yo with h/o T2DM presents for checkup of R big toe ulcer that started 2 1/2 wks ago after patient scraped toe when putting on shoe.  No fever, erythema, pain.  (decreased sensation).  Wife has been dressing with peroxide and gauze.  Daughter who is a Engineer, civil (consulting) is currently home recovering from surgery.  Leg swelling better with use of compression stockings and leg elevation.  Not previously had ABIs.  Also stating cbgs have been recently brittle.  dropping to teens at home.  Only on glimepiride 2mg  1/2 two times a day.    Habits & Providers  Alcohol-Tobacco-Diet     Tobacco Status: quit > 6 months  Current Medications (verified): 1)  Aspirin Ec 81 Mg Tbec (Aspirin) .... Take 1 Tablet By Mouth Once A Day 2)  Clonidine Hcl 0.2 Mg Tabs (Clonidine Hcl) .... Take 1 Tablet By Mouth Twice A Day 3)  Glimepiride 2 Mg Tabs (Glimepiride) .... Take 1/2 Tablet By Mouth Twice A Day 4)  Lisinopril 20 Mg Tabs (Lisinopril) .... Take 1 Tablet By Mouth At Bedtime 5)  Relion Confirm Glucose Monitor W/device Kit (Blood Glucose Monitoring Suppl) .... Check Blood Sugar Twice A Day.  Indication: 250.90 6)  Relion Alcohol Swabs  Pads (Alcohol Swabs) .... Check Blood Sugar Twice Daily. Indication: 250.92 Disp:14-Month Supply Refill: Prn 7)  Relion Lancets Thin 26g  Misc (Lancets) .... Check Blood Sugar Twice A Day. Disp: 14-Month Supply. Refill: Prn  Allergies (verified): 1)  Vioxx 2)  Tramadol Hcl (Tramadol Hcl)  Past History:  Past medical,  surgical, family and social histories (including risk factors) reviewed for relevance to current acute and chronic problems.  Past Medical History: Reviewed history from 08/04/2008 and no changes required. 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 Cataract surgery   s/p T3-T4 osteomyelitis with multiple open drainages including thoracotomy at [facility] 1993  s/p T1-T2 spinal cord compression secondary to granualtion tissue and abscess material secondary to previous mediastinitis at [facility] 1993  s/p C1-2, C2-3 anterior cervical diskectomies, partial corpectomies through thoracotomy at [facility] 1993 s/p Feeding jejunoscopy [facility] 1993  Hx of esophageal perforation complication 1993  Hx of Positive PPD at [facility] 1993  Epidural corticosteroid injection for L2-3 severe spinal stenosis at [facility] on 03/03/09   Epidural corticosteroid injection for L4-5 severe spinal stenosis at [facility] on 03/17/09 Interlaminar lumbar epidural steroid injection, right L2-3 at [facility] on 04/27/09.  This completes the series of epidural corticosteroid injections in this patient.  Epidural steroid  injection, right  L2-3, #3.  This completes the series of injections in this patient.    Family History: Reviewed history and no changes required.  Social History: Reviewed history from 08/01/2008 and no changes required. Native of [place] No tobacco No alcohol No illicit drugs.   Retired from [employer]. in  [place]. Smoking Status:  quit > 6 months  Physical Exam  General:  Alert, elderly male Pulses:  2+ periph pulse DP L, 1+ periph pulse DP R Extremities:  no edema.  Neurologic:  decreased sensation foot Skin:   Right great toe with 1.5cm ulcer on dorsal IP joint covered with eschar, borders with granulation tissue.  No erythema/streaking.  No fluctuance/induration. Wound culture obtained.   Impression & Recommendations:  Problem # 1:  DIABETIC FOOT ULCER, RIGHT (ICD-250.80)  no evidence of infection.  however will get wound culture to check.  Precepted with Luretha Murphy.  RTC 1 wk for ABIs and f/u wound.  Dressed with hydrocolloid and tape.  cleaned with sterile saline prior.  His updated medication list for this problem includes:    Aspirin Ec 81 Mg Tbec (Aspirin) .Marland Kitchen... Take 1 tablet by mouth once a day    Glimepiride 2 Mg Tabs (Glimepiride) .Marland Kitchen... Take 1/2 tablet by mouth twice a day    Lisinopril 20 Mg Tabs (Lisinopril) .Marland Kitchen... Take 1 tablet by mouth at bedtime  Orders: Culture, Wound -[facility] ([id number]) Gram Stain-[facility] ([id number])  Problem # 2:  DIABETES MELLITUS, II, COMPLICATIONS (ICD-250.92)  advised to hold glimepiride if cbgs dropping to teens.  Reassess with PCP next visit.  A1c deteriorated.  His updated medication list for this problem includes:    Aspirin Ec 81 Mg Tbec (Aspirin) .Marland Kitchen... Take 1 tablet by mouth once a day    Glimepiride 2 Mg Tabs (Glimepiride) .Marland Kitchen... Take 1/2 tablet by mouth twice a day    Lisinopril 20 Mg Tabs (Lisinopril) .Marland Kitchen... Take 1 tablet by mouth at bedtime  Orders: A1C-[facility] (44010)  Labs Reviewed: Creat: 1.37 (05/01/2009)   Microalbumin: not indicated (04/04/2008)  Last Eye Exam: refused (04/04/2008) Reviewed HgBA1c results: 8.0 (06/16/2009)  7.1 (02/14/2009)  Complete Medication List: 1)  Aspirin Ec 81 Mg Tbec (Aspirin) .... Take 1 tablet by mouth once a day 2)  Clonidine Hcl 0.2 Mg Tabs (Clonidine hcl) .... Take 1 tablet by mouth twice a day 3)   Glimepiride 2 Mg Tabs (Glimepiride) .... Take 1/2 tablet by mouth twice a day 4)  Lisinopril 20 Mg Tabs (Lisinopril) .... Take 1 tablet by mouth at bedtime 5)  Relion Confirm Glucose Monitor W/device Kit (Blood glucose monitoring suppl) .... Check blood sugar twice a day.  indication: 250.90 6)  Relion Alcohol Swabs Pads (Alcohol swabs) .... Check blood sugar twice daily. indication: 250.92 disp:36-month supply refill: prn 7)  Relion Lancets Thin 26g Misc (Lancets) .... Check blood sugar twice a day. disp: 22-month supply. refill: prn  Patient Instructions: 1)  Come back next Thursday or Friday for follow up visit with PCP and ABIs with Dr. Raymondo Band. 2)  No antibiotics for now.   3)  we have gotten wound cultures today. 4)  Restore colloid dressing, keep on for 4-6 days. 5)  Call us if you start having fevers or redness in your foot or streaks or worsening instead of betting better. 6)  Hold your glimepiride if your sugars are dropping so low.  Make sure to eat at least a little bit each meal.  Laboratory Results   Blood Tests  Date/Time Received: June 16, 2009 1:44 PM  Date/Time Reported: June 16, 2009 1:54 PM   HGBA1C: 8.0%   (Normal Range: Non-Diabetic - 3-6%   Control Diabetic - 6-8%)  Comments: ...........test performed by...........Marland KitchenTerese Door, CMA     Appended Document: Orders Update    Clinical Lists Changes  Orders: Added new Test order of [facility]- Est Level  3 ([id number]) - Signed

## 2010-07-10 NOTE — Assessment & Plan Note (Signed)
Summary: f/u,df   Vital Signs:  Patient profile:   77 year old male Height:      65.5 inches Weight:      165 pounds BMI:     27.14 Temp:     98.7 degrees F oral Pulse rate:   82 / minute BP sitting:   154 / 81  (left arm) Cuff size:   regular  Vitals Entered By: Garen Grams LPN (April 02, 2010 8:58 AM) CC: f/u chronic issues Is Patient Diabetic? Yes Did you bring your meter with you today? No Pain Assessment Patient in pain? yes     Location: legs   Primary Care Provider:  Tawanna Cooler McDiarmid MD  CC:  f/u chronic issues.  History of Present Illness: Pt accompanied by his wife for history and PHysical.  She supplied some of the informantion  Pins and Needles in both legs Onset after use of tramadol in February 2010. Continuous non-progressive Located circumferentiallyover foreleg.  Not involving feet. Burning and Pins & Needle quality.  No weakness in legs.  No sensory changes or strength changes in arms/hands No back pain.  No alcohol. PMH significant for DMT2, and lumbar spinal stenosis   DIABETES Disease Monitoring   Blood Sugar ranges:78 - 120 at home   Polyuria:none   Visual problems:no change  Medications   Compliance:yes. glimpieride half tablet twice a day Side effects   Hypoglycemic symptoms:none  Prevention   Eye exam UTD:   Monitoring feet:daily   Diet pattern:watches fat and salt in diet   Exercise:        HYPERTENSION Disease Monitoring   Blood pressure range:4 home readings 119 to 131/ 78 to 87   Chest pain:none   Dyspnea:none   Claudication:none  Medications   Compliance: taking clonidine and lisiopril. Side effects   Lightheadedness:none   Urinary frequency:none   Edema:ankle edema bilaterally (Chronic)       Habits & Providers  Alcohol-Tobacco-Diet     Alcohol drinks/day: 0     Tobacco Status: quit     Year Quit: 1988  Current Medications (verified): 1)  Aspirin Ec 81 Mg Tbec (Aspirin) .... Take 1 Tablet By Mouth  Once A Day 2)  Clonidine Hcl 0.2 Mg Tabs (Clonidine Hcl) .... Take 1 Tablet By Mouth Twice A Day 3)  Glimepiride 2 Mg Tabs (Glimepiride) .... Take 1/2 Tablet By Mouth Twice A Day 4)  Lisinopril 20 Mg Tabs (Lisinopril) .... Take 1 Tablet By Mouth At Bedtime 5)  Relion Confirm Glucose Monitor W/device Kit (Blood Glucose Monitoring Suppl) .... Check Blood Sugar Twice A Day.  Indication: 250.90 6)  Relion Alcohol Swabs  Pads (Alcohol Swabs) .... Check Blood Sugar Twice Daily. Indication: 250.92 Disp:22-Month Supply Refill: Prn 7)  Relion Lancets Thin 26g  Misc (Lancets) .... Check Blood Sugar Twice A Day. Disp: 22-Month Supply. Refill: Prn 8)  Daily Multiple Vitamins  Tabs (Multiple Vitamin) .... Once Daily 9)  Vitamin B-12 1000 Mcg Tabs (Cyanocobalamin) .... Once Daily 10)  Vitamin B-6 100 Mg Tabs (Pyridoxine Hcl) .... 3 Once Daily 11)  Vitamin C 500 Mg Chew (Ascorbic Acid) .... One Daily 12)  Vitamin D 400 Unit Caps (Cholecalciferol) .... Once Daily 13)  Natural Vitamin E 100 Unit Caps (Vitamin E) .... Once Daily 14)  Fish Oil   Oil (Fish Oil) .Marland Kitchen.. 1 Once Daily 15)  Potassium Gluconate 595 Mg Tabs (Potassium Gluconate) .... 1/2 Pill Once Daily 16)  Naprosyn 500 Mg Tabs (Naproxen) .Marland Kitchen.. 1 Tablet By Mouth  Twice A Day As Needed For Pain 17)  Gabapentin 100 Mg Caps (Gabapentin) .... One Tablet By Mouth At Bedtime For Three Days, Then 2 Tablets At Bedtime For Three Days, The 3 Tablets At Bedtime Daily 18)  Mega D3 .... Vitamin D Supplement With Extract of Red Wine.  Allergies (verified): 1)  Vioxx 2)  Tramadol Hcl (Tramadol Hcl) PMH reviewed for relevance, PSH reviewed for relevance  Family History: No cancers  Physical Exam  General:  alert.  Walking without assistance using a walker with wheels.  Lungs:  normal respiratory effort, normal breath sounds, no crackles, and no wheezes.   Heart:  normal rate, regular rhythm, and no murmur.   Abdomen:  soft, non-tender, and normal bowel sounds.     Pulses:  Palpable DP pulse on left, difficult to detect DP pulse on right.   Hair on legs Palpable Popliteal pulses bilaterally Extremities:  Scar on dorsum grt toe.  pitting ankle edema bilaterally with stasis changes of overlying skin.  Neurologic:  Intact vibration grt toes bilaterally Variable proprioception grt toes bilaterally Intact cold temperature feet bilaterally Intact monofilament bilaterally feet Intact to sharp prick bilaterally feet Strength 5/5 ankle DF/PF               Knee 5/5 ext/flex Speech clear and prosodic Language syntacticallyt structure, semantically meaningful and pragmatically directed.   Psych:  memory intact for recent and remote, normally interactive, good eye contact, not anxious appearing, and not depressed appearing.    Diabetes Management Exam:    Foot Exam (with socks and/or shoes not present):       Sensory-Pinprick/Light touch:          Left medial foot (L-4): normal          Left dorsal foot (L-5): normal          Left lateral foot (S-1): normal          Right medial foot (L-4): normal          Right dorsal foot (L-5): normal          Right lateral foot (S-1): normal       Sensory-Monofilament:          Left foot: normal          Right foot: normal       Inspection:          Left foot: normal          Right foot: normal       Nails:          Left foot: normal          Right foot: normal   Impression & Recommendations:  Problem # 1:  DISTURBANCE OF SKIN SENSATION (ICD-782.0) 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  No sympotms of vasculitis, cancer nor infection.  Patient's Lumbar stenosis may have a role in the presentation.   Will check for common causes of acquired polyneuropathis with labs. Will try empiric trial of gabapentin.  Given pt's intolerance to tramadol in past, will start at a low dose of gabapentin 100 mg and  titrate up to 300 mg at bedtime.  If tolerates nighttime titration, and needs further addition, will start adding day time dose of gabapentin.  Pins and needles sensation is the target symptom.  If not tolerating gabapentin, or if not responsding will get Nerve conduction studies and consider referral to  NS for question if his lumbar stenosis could be playing a role in this presentation.  Orders: B12-[facility] ([id number]) TSH-[facility] [id number]) ANA-[facility] [id number]) Sed Rate (ESR)-[facility] [id number]) SPEP- [facility] [phone]) Comp Met-[facility] [id number]) [facility]- Est  Level 4 (43329)  Problem # 2:  DIABETES MELLITUS, II, COMPLICATIONS (ICD-250.92) Assessment: Improved Adequate control. Tolerating medication. No new organ damage. Plan to continue current medication.  His updated medication list for this problem includes:    Aspirin Ec 81 Mg Tbec (Aspirin) .Marland Kitchen... Take 1 tablet by mouth once a day    Glimepiride 2 Mg Tabs (Glimepiride) .Marland Kitchen... Take 1/2 tablet by mouth twice a day    Lisinopril 20 Mg Tabs (Lisinopril) .Marland Kitchen... Take 1 tablet by mouth at bedtime  Orders: A1C-[facility] ([id number]) [facility]- Est  Level 4 (16606)  Complete Medication List: 1)  Aspirin Ec 81 Mg Tbec (Aspirin) .... Take 1 tablet by mouth once a day 2)  Clonidine Hcl 0.2 Mg Tabs (Clonidine hcl) .... Take 1 tablet by mouth twice a day 3)  Glimepiride 2 Mg Tabs (Glimepiride) .... Take 1/2 tablet by mouth twice a day 4)  Lisinopril 20 Mg Tabs (Lisinopril) .... Take 1 tablet by mouth at bedtime 5)  Relion Confirm Glucose Monitor W/device Kit (Blood glucose monitoring suppl) .... Check blood sugar twice a day.  indication: 250.90 6)  Relion Alcohol Swabs Pads (Alcohol swabs) .... Check blood sugar twice daily. indication: 250.92 disp:24-month supply refill: prn 7)  Relion Lancets Thin 26g Misc (Lancets) .... Check blood sugar twice a day. disp: 17-month supply. refill: prn 8)  Daily Multiple Vitamins Tabs (Multiple vitamin) .... Once daily 9)  Vitamin B-12  1000 Mcg Tabs (Cyanocobalamin) .... Once daily 10)  Vitamin B-6 100 Mg Tabs (Pyridoxine hcl) .... 3 once daily 11)  Vitamin C 500 Mg Chew (Ascorbic acid) .... One daily 12)  Vitamin D 400 Unit Caps (Cholecalciferol) .... Once daily 13)  Natural Vitamin E 100 Unit Caps (Vitamin e) .... Once daily 14)  Fish Oil Oil (Fish oil) .Marland Kitchen.. 1 once daily 15)  Potassium Gluconate 595 Mg Tabs (Potassium gluconate) .... 1/2 pill once daily 16)  Naprosyn 500 Mg Tabs (Naproxen) .Marland Kitchen.. 1 tablet by mouth twice a day as needed for pain 17)  Gabapentin 100 Mg Caps (Gabapentin) .... One tablet by mouth at bedtime for three days, then 2 tablets at bedtime for three days, the 3 tablets at bedtime daily 18)  Mega D3  .... Vitamin d supplement with extract of red wine.  Patient Instructions: 1)  Please schedule a follow-up appointment in 1 month.  2)  Start Gabapentin for your "Pins and Needles" feelings in your legs. This sounds like Peripheral Neuropathy.   3)  Start with one pill at bedtime for three days then increase to two tablets at bedtime for three days then take three tablets at bedtime daily. Prescriptions: GABAPENTIN 100 MG CAPS (GABAPENTIN) One tablet by mouth at bedtime for three days, then 2 tablets at bedtime for three days, the 3 tablets at bedtime daily  #90 x 1   Entered and Authorized by:   Tawanna Cooler McDiarmid MD   Signed by:   Tawanna Cooler McDiarmid MD on 04/02/2010   Method used:   Print then Give to Patient   RxID:   [id number]    Orders Added: 1)  A1C-[facility] [83036] 2)  B12-[facility] [[id number]] 3)  TSH-[facility] [[id number]] 4)  ANA-[facility] [[id number]] 5)  Sed Rate (ESR)-[facility] [85651] 6)  SPEP- [facility] [[id number]] 7)  Comp Met-[facility] [[id number]] 8)  [facility]- Est  Level 4 [60454]    Laboratory Results   Blood Tests   Date/Time Received: April 02, 2010 8:52 AM  Date/Time Reported: April 02, 2010 2:50 PM    HGBA1C: 5.2%   (Normal Range: Non-Diabetic - 3-6%   Control Diabetic - 6-8%) SED rate: 6 mm/hr   Comments: ...............test performed by......Marland KitchenBonnie A. [place], MLS (ASCP)cm ESR entered by Terese Door, CMA    Appended Document: Low TSH  Low TSH lab from 04/02/10 Will need TSH, FT3 and FT4 on next office visit next month Hyperthyroidism is not listed as a cause of peripheral neuropathy, only hypothyroid neuropathy.

## 2010-07-10 NOTE — Progress Notes (Signed)
Summary: Rx Req  Phone Note Refill Request Call back at Home Phone [phone] Message from:  Patient  Refills Requested: Medication #1:  GLIMEPIRIDE 2 MG TABS Take 1/2 tablet by mouth twice a day WOULD LIKE TO HAVE THIS RX FOR 90 DAYS LIKE THE REST OF HIS MEDS.  [facility] IS THE PHARMACY.  Initial call taken by: Clydell Hakim,  September 13, 2009 2:21 PM    Prescriptions: GLIMEPIRIDE 2 MG TABS (GLIMEPIRIDE) Take 1/2 tablet by mouth twice a day  #90 x 3   Entered and Authorized by:   Tawanna Cooler Fredrica Capano MD   Signed by:   Tawanna Cooler Kelliann Pendergraph MD on 09/14/2009   Method used:   Electronically to        [facility]* (mail-order)             ,          Ph: [phone]       Fax: [phone]   RxID:   [id number]

## 2010-07-10 NOTE — Progress Notes (Signed)
Summary: Rx  Phone Note Refill Request Call back at Home Phone [phone]   pt requesting rx for naproxen, didn't see on [facility], pt uses [facility]  Initial call taken by: Knox Royalty,  January 01, 2010 9:21 AM    New/Updated Medications: NAPROSYN 500 MG TABS (NAPROXEN) 1 tablet by mouth twice a day as needed for pain Prescriptions: NAPROSYN 500 MG TABS (NAPROXEN) 1 tablet by mouth twice a day as needed for pain  #180 x 3   Entered and Authorized by:   Tawanna Cooler Ismelda Weatherman MD   Signed by:   Tawanna Cooler Desteni Piscopo MD on 01/01/2010   Method used:   Faxed to ...       [facility] (mail-order)             , [place]         Ph:        Fax: [phone]   RxID:   [id number]

## 2010-07-10 NOTE — Assessment & Plan Note (Signed)
Summary: f/u visit/bmc   Vital Signs:  Patient profile:   77 year old male Height:      65.5 inches Weight:      170.3 pounds BMI:     28.01 Temp:     97.6 degrees F oral Pulse rate:   88 / minute BP sitting:   172 / 79  (left arm) Cuff size:   regular  Vitals Entered By: Garen Grams LPN (May 14, 2010 2:30 PM) CC: f/u neuropathy Is Patient Diabetic? Yes Did you bring your meter with you today? No Pain Assessment Patient in pain? yes     Location: legs   Primary Care Provider:  Tawanna Cooler McDiarmid MD  CC:  f/u neuropathy.  History of Present Illness: Peripheral Neuropathy Patient found 300mg  gabapentin too sedating.  He cut the dose down to 100 mg twice a day without problems and adequate control of the "pins and Needles" sensation in his forelegs (not feet).   he did have a fall a couple weeks a go because his walker's skid was broke and causght on carpet.  he feel backward and struck his upper back and head withourt loss of consicousness.  He was sore in his back for couple days that has resolved.   He denies diarrhea, skin changes, hair loss.  His wife is interested in Physical Therapy to help with his balance but wants to wait until after holidays before starting therapy.   Habits & Providers  Alcohol-Tobacco-Diet     Alcohol drinks/day: 0     Tobacco Status: quit     Year Quit: 1988  Current Problems (verified): 1)  Peripheral Neuropathy  (ICD-356.9) 2)  Spinal Stenosis, Lumbar, Severe Worse At L2/3  (ICD-724.02) 3)  Peripheral Edema  (ICD-782.3) 4)  Gait Disturbance  (ICD-781.2) 5)  Lumbar Sprain and Strain  (ICD-847.2) 6)  Kidney Disease, Chronic, Stage Iii  (ICD-585.3) 7)  Renal Insufficiency, Chronic  (ICD-586) 8)  Proteinuria  (ICD-791.0) 9)  Osteoarthritis, Lower Leg  (ICD-715.96) 10)  Obesity, Nos  (ICD-278.00) 11)  Neuropathy, Peripheral  (ICD-356.9) 12)  Impotence, Organic  (ICD-607.84) 13)  Hypertension, Benign Systemic  (ICD-401.1) 14)   Hyperlipidemia  (ICD-272.4) 15)  Diabetes Mellitus, II, Complications  (ICD-250.92)  Current Medications (verified): 1)  Aspirin Ec 81 Mg Tbec (Aspirin) .... Take 1 Tablet By Mouth Once A Day 2)  Clonidine Hcl 0.2 Mg Tabs (Clonidine Hcl) .... Take 1 Tablet By Mouth Twice A Day 3)  Glimepiride 2 Mg Tabs (Glimepiride) .... Take 1/2 Tablet By Mouth Twice A Day 4)  Lisinopril 20 Mg Tabs (Lisinopril) .... Take 1 Tablet By Mouth At Bedtime 5)  Relion Confirm Glucose Monitor W/device Kit (Blood Glucose Monitoring Suppl) .... Check Blood Sugar Twice A Day.  Indication: 250.90 6)  Relion Alcohol Swabs  Pads (Alcohol Swabs) .... Check Blood Sugar Twice Daily. Indication: 250.92 Disp:31-Month Supply Refill: Prn 7)  Relion Lancets Thin 26g  Misc (Lancets) .... Check Blood Sugar Twice A Day. Disp: 31-Month Supply. Refill: Prn 8)  Daily Multiple Vitamins  Tabs (Multiple Vitamin) .... Once Daily 9)  Vitamin B-12 1000 Mcg Tabs (Cyanocobalamin) .... Once Daily 10)  Vitamin B-6 100 Mg Tabs (Pyridoxine Hcl) .... 3 Once Daily 11)  Vitamin C 500 Mg Chew (Ascorbic Acid) .... One Daily 12)  Vitamin D 400 Unit Caps (Cholecalciferol) .... Once Daily 13)  Natural Vitamin E 100 Unit Caps (Vitamin E) .... Once Daily 14)  Fish Oil   Oil (Fish Oil) .Marland KitchenMarland KitchenMarland Kitchen 1  Once Daily 15)  Potassium Gluconate 595 Mg Tabs (Potassium Gluconate) .... 1/2 Pill Once Daily 16)  Naprosyn 500 Mg Tabs (Naproxen) .Marland Kitchen.. 1 Tablet By Mouth Twice A Day As Needed For Pain 17)  Gabapentin 100 Mg Caps (Gabapentin) .... One Tablet By Mouth At Bedtime For Three Days, Then 2 Tablets At Bedtime For Three Days, The 3 Tablets At Bedtime Daily 18)  Mega D3 .... Vitamin D Supplement With Extract of Red Wine. 19)  Relion Blood Glucose Test  Strp (Glucose Blood) .... Test Blood Sugar Once A Day  Allergies (verified): 1)  Vioxx 2)  Tramadol Hcl (Tramadol Hcl)  Physical Exam  General:  alert.  Walking without assistance using a walker with wheels.    Impression  & Recommendations:  Problem # 1:  PERIPHERAL NEUROPATHY (ICD-356.9)  Improved with Gabapentin 100 mg twice a day.  Tolerating medication.  Plan to continue current tx.  Pt is interested in working with NeuroRehab for balance training to avoid falls. He wants to wait until after holidays.  Will arrange Neurorehab Physical Therapy consultation after start of New Year.   Orders: [facility]- Est Level  2 (40981)  Complete Medication List: 1)  Aspirin Ec 81 Mg Tbec (Aspirin) .... Take 1 tablet by mouth once a day 2)  Clonidine Hcl 0.2 Mg Tabs (Clonidine hcl) .... Take 1 tablet by mouth twice a day 3)  Glimepiride 2 Mg Tabs (Glimepiride) .... Take 1/2 tablet by mouth twice a day 4)  Lisinopril 20 Mg Tabs (Lisinopril) .... Take 1 tablet by mouth at bedtime 5)  Relion Confirm Glucose Monitor W/device Kit (Blood glucose monitoring suppl) .... Check blood sugar twice a day.  indication: 250.90 6)  Relion Alcohol Swabs Pads (Alcohol swabs) .... Check blood sugar twice daily. indication: 250.92 disp:60-month supply refill: prn 7)  Relion Lancets Thin 26g Misc (Lancets) .... Check blood sugar twice a day. disp: 10-month supply. refill: prn 8)  Daily Multiple Vitamins Tabs (Multiple vitamin) .... Once daily 9)  Vitamin B-12 1000 Mcg Tabs (Cyanocobalamin) .... Once daily 10)  Vitamin B-6 100 Mg Tabs (Pyridoxine hcl) .... 3 once daily 11)  Vitamin C 500 Mg Chew (Ascorbic acid) .... One daily 12)  Vitamin D 400 Unit Caps (Cholecalciferol) .... Once daily 13)  Natural Vitamin E 100 Unit Caps (Vitamin e) .... Once daily 14)  Fish Oil Oil (Fish oil) .Marland Kitchen.. 1 once daily 15)  Potassium Gluconate 595 Mg Tabs (Potassium gluconate) .... 1/2 pill once daily 16)  Naprosyn 500 Mg Tabs (Naproxen) .Marland Kitchen.. 1 tablet by mouth twice a day as needed for pain 17)  Gabapentin 100 Mg Caps (Gabapentin) .... One tablet by mouth at bedtime for three days, then 2 tablets at bedtime for three days, the 3 tablets at bedtime daily 18)  Mega D3   .... Vitamin d supplement with extract of red wine. 19)  Relion Blood Glucose Test Strp (Glucose blood) .... Test blood sugar once a day  Other Orders: TSH-[facility] [id number]) Free T3-[facility] [id number]) Free T4-[facility] [id number])   Orders Added: 1)  TSH-[facility] [[id number]] 2)  Free T3-[facility] [[id number]] 3)  Free T4-[facility] [[id number]] 4)  [facility]- Est Level  2 [64332]

## 2010-07-12 NOTE — Progress Notes (Signed)
Summary: Rx not received  Phone Note Refill Request Call back at Home Phone [phone]   Refills Requested: Medication #1:  GABAPENTIN 100 MG CAPS One tablet by mouth at bedtime for three days pt also asking about getting ted hose  Initial call taken by: Knox Royalty,  May 18, 2010 11:59 AM  Follow-up for Phone Call        Rx will [facility] fax[facility] to [facility].  Dr. McDiarmid to handle request for TED hose Follow-up by: Doralee Albino MD,  May 18, 2010 12:10 PM    New/Updated Medications: GABAPENTIN 100 MG CAPS (GABAPENTIN) One tablet by mouth two times a day * KNEE HIGH SUPPORT HOSE 15 - 20 MMHG COMPRESSION FOR MEN Put on in morning, remove at bedtime. Disp: 1 pair.  Indication: leg venous insufficiency Prescriptions: KNEE HIGH SUPPORT HOSE 15 - 20 MMHG COMPRESSION FOR MEN Put on in morning, remove at bedtime. Disp: 1 pair.  Indication: leg venous insufficiency  #1 x 0   Entered by:   Tawanna Cooler McDiarmid MD   Authorized by:   Doralee Albino MD   Signed by:   Tawanna Cooler McDiarmid MD on 05/21/2010   Method used:   Faxed to ...       [facility] (mail-order)             , [place]         Ph:        Fax: [phone]   RxID:   [id number] GABAPENTIN 100 MG CAPS (GABAPENTIN) One tablet by mouth two times a day  #180 x 3   Entered and Authorized by:   Doralee Albino MD   Signed by:   Doralee Albino MD on 05/18/2010   Method used:   Printed then faxed to ...       [facility] (mail-order)             , [place]         Ph:        Fax: [phone]   RxID:   [id number]  faxed as directed. Theresia Lo RN  May 18, 2010 12:36 PM

## 2010-07-12 NOTE — Miscellaneous (Signed)
Summary: Mail order prescriptions  Patients wife dropped off forms for mail order rx's.  Please fax when completed. Bradly Bienenstock  June 14, 2010 9:41 AM  [facility] refill auth. placed in Dr. Alegria Dominique's box.... Terese Door  June 14, 2010 9:44 AM  electronic Fax'd refills to [facility] for Clonidine, Glimepiride, lisinopril and gabapentin. Kambrea Carrasco MD  June 18, 2010 9:47 AM

## 2010-07-12 NOTE — Miscellaneous (Signed)
Summary: Referral Neurorehab for balance evaluation and Tx  Clinical Lists Changes  Orders: Added new Referral order of Rehabilitation Referral (Rehab) - Signed

## 2010-07-18 ENCOUNTER — Ambulatory Visit: Payer: Medicare Other | Attending: Family Medicine | Admitting: Physical Therapy

## 2010-07-18 DIAGNOSIS — IMO0001 Reserved for inherently not codable concepts without codable children: Secondary | ICD-10-CM | POA: Insufficient documentation

## 2010-07-18 DIAGNOSIS — R269 Unspecified abnormalities of gait and mobility: Secondary | ICD-10-CM | POA: Insufficient documentation

## 2010-07-18 DIAGNOSIS — M6281 Muscle weakness (generalized): Secondary | ICD-10-CM | POA: Insufficient documentation

## 2010-07-18 DIAGNOSIS — R293 Abnormal posture: Secondary | ICD-10-CM | POA: Insufficient documentation

## 2010-07-18 DIAGNOSIS — R5381 Other malaise: Secondary | ICD-10-CM | POA: Insufficient documentation

## 2010-07-20 ENCOUNTER — Encounter: Payer: Self-pay | Admitting: Home Health Services

## 2010-07-24 ENCOUNTER — Ambulatory Visit: Payer: Self-pay | Admitting: Physical Therapy

## 2010-08-01 ENCOUNTER — Ambulatory Visit: Payer: Medicare Other | Admitting: Physical Therapy

## 2010-08-15 ENCOUNTER — Encounter: Payer: Self-pay | Admitting: Home Health Services

## 2010-08-15 ENCOUNTER — Ambulatory Visit: Payer: Self-pay | Admitting: Physical Therapy

## 2010-08-15 ENCOUNTER — Ambulatory Visit (INDEPENDENT_AMBULATORY_CARE_PROVIDER_SITE_OTHER): Payer: Medicare Other | Admitting: Home Health Services

## 2010-08-15 VITALS — BP 165/82 | Temp 97.7°F | Ht 64.5 in | Wt 166.0 lb

## 2010-08-15 DIAGNOSIS — Z0000 Encounter for general adult medical examination without abnormal findings: Secondary | ICD-10-CM

## 2010-08-15 NOTE — Progress Notes (Signed)
Patient here for annual wellness visit, patient reports: Risk Factors/Conditions needing evaluation or treatment: Patient does not have any risk factors needing evaluation. Diet: Patient has a varied diet of fish, fruit, whole grains, and vegetables.  Patient would like to lose about 15 pounds. Physical Activity: Patient does daily exercises shown to him by his PT.  Patient also does chores around his home. Home Safety: Patient lives in a first floor apartment with his wife.  Patient reports having smoke detectors and adaptive equipment in his bathroom. End of Life Plan: Patient reports having his will in place.  Gave patient pamphlet for advance directives to discuss with his wife.  Patient identified his wife, Britt Petroni, daughter Marquin Patino [phone]) and/or son Lenus Trauger ([phone]) as his emergency contacts.  Other Information: Patient wears corrective lens and has not gone to eye doctor in over 3 years. Patient uses a walker daily. Patient has full dentures. Patient appears very determined and capable of making behavior changes.   Balance max value patientvalue  Sitting balance 1 1  Arise 2 0  Attempts to arise 2 2  Immediate standing balance 2 0  Standing balance 1 0  Nudge 2 0  Eyes closed 1 0  360 degree turn 1 0  Sitting down 2 1   Gait max value patient value  Initiation of gait 1 1  Step length-left 1 1  Step length-right 1 1  Step height-left 1 1  Step height-right 1 1  Step symmetry 1 1  Step continuity 1 1  Path 2 1  Trunk 2 1  Walking stance 1 0   Balance/Gait Score: 13/26  Mental Status Exam max value patient value  Orientation to time 5 5  Orientation to place 5 5  Registration 3 3  Attention 5 5  Recall 3 3  Language (name 2 objects) 2 2  Language-repeat 1 1  Language-follow 3 step command 3 3  Language-read and follow directions 1 1   Mental Status Exam: 28/28   Annual Wellness Visit Requirements Recorded Today In    Medical, family, social history Past Medical, Family, Social History Section  Current providers Care team  Current medications Medications  Wt, BP, Ht, BMI Vital signs  Tobacco, alcohol, illicit drug use History  ADL Nurse Assessment  Depression Screening Nurse Assessment  Cognitive impairment Nurse Assessment  Fall Risk Nurse Assessment  Home Safety Progress Note  End of Life Planning (welcome visit) Progress Note  Medicare preventative services Progress Note  Risk factors/conditions needing evaluation/treatment Progress Note  Personalized health advice Patient Instructions, goals, letter    Medicare Prevention Plan: Patient will follow up with pharmacy for shingles vaccine.   Recommended Medicare Prevention Screenings Men over 39 Test For Frequency Date of Last- BOLD if needed  Colorectal Cancer 1-10 yrs 03/2008  Prostate Cancer Never or yearly 07/2008  Aortic Aneurysm Once if 65-75 with hx of smoking declined  Cholesterol 5 yrs 03/2008  Diabetes yearly 03/2010  HIV yearly declined  Influenza Shot yearly 02/2008  Pneumonia Shot once 05/1999  Zostavax Shot once [facility] patient pamphlet

## 2010-08-15 NOTE — Patient Instructions (Signed)
1. Work on losing 15 pounds. 2. Try to eat 3-4 vegetables every day. 3. Follow up with the [employer] to join the silver sneakers program. 4. Continue to doing your exercises every day and try to walk or swim weekly. 5. Follow up with your pharmacy about getting the shingles vaccine.  6. It is time to make a follow up appointment with your eye doctor.

## 2010-08-16 ENCOUNTER — Encounter: Payer: Self-pay | Admitting: Home Health Services

## 2010-08-16 NOTE — Progress Notes (Addendum)
I have reviewed this visit and discussed with Suzanne Lineberry and agree with her documentation  

## 2010-12-03 ENCOUNTER — Other Ambulatory Visit: Payer: Self-pay | Admitting: Family Medicine

## 2010-12-03 NOTE — Telephone Encounter (Signed)
Refill request

## 2011-02-23 ENCOUNTER — Other Ambulatory Visit: Payer: Self-pay | Admitting: Family Medicine

## 2011-02-24 NOTE — Telephone Encounter (Signed)
Refill request

## 2011-03-28 ENCOUNTER — Encounter: Payer: Self-pay | Admitting: Family Medicine

## 2011-03-28 ENCOUNTER — Ambulatory Visit (INDEPENDENT_AMBULATORY_CARE_PROVIDER_SITE_OTHER): Payer: Medicare Other | Admitting: Family Medicine

## 2011-03-28 VITALS — BP 172/94 | HR 78 | Temp 97.5°F | Ht 64.5 in | Wt 177.0 lb

## 2011-03-28 DIAGNOSIS — E118 Type 2 diabetes mellitus with unspecified complications: Secondary | ICD-10-CM

## 2011-03-28 DIAGNOSIS — E1165 Type 2 diabetes mellitus with hyperglycemia: Secondary | ICD-10-CM

## 2011-03-28 DIAGNOSIS — G609 Hereditary and idiopathic neuropathy, unspecified: Secondary | ICD-10-CM

## 2011-03-28 DIAGNOSIS — Z79899 Other long term (current) drug therapy: Secondary | ICD-10-CM

## 2011-03-28 DIAGNOSIS — N19 Unspecified kidney failure: Secondary | ICD-10-CM

## 2011-03-28 LAB — COMPREHENSIVE METABOLIC PANEL
AST: 21 U/L (ref 0–37)
Albumin: 4.1 g/dL (ref 3.5–5.2)
Alkaline Phosphatase: 59 U/L (ref 39–117)
BUN: 15 mg/dL (ref 6–23)
Calcium: 9.3 mg/dL (ref 8.4–10.5)
Creat: 1.22 mg/dL (ref 0.50–1.35)
Glucose, Bld: 206 mg/dL — ABNORMAL HIGH (ref 70–99)
Potassium: 4.4 mEq/L (ref 3.5–5.3)
Sodium: 136 mEq/L (ref 135–145)
Total Bilirubin: 0.8 mg/dL (ref 0.3–1.2)

## 2011-03-28 MED ORDER — GLUCOSE BLOOD VI STRP: ORAL_STRIP | Status: DC

## 2011-03-28 MED ORDER — GABAPENTIN 100 MG PO CAPS: ORAL_CAPSULE | ORAL | Status: DC

## 2011-03-28 MED ORDER — RELION LANCETS THIN 26G MISC: Status: DC

## 2011-03-28 NOTE — Patient Instructions (Signed)
Increase your Gabapentin to one tablet three times a day.  You may increase your gabapentin by one tablet each dose if your need to for continued leg pain.  Please call Dr Trevin Gartrell if you have questions about adjusting the gabapentin dose.  Dr Imonie Tuch will contact the [facility] shop to arrange diabetic shoes.  [name], Social worker from the [facility] will contact you to see if there are home services with which she may help you get in contact.   Dr Waqas Bruhl will call you if the blood test results are abnormal, otherwise he will send you a letter about the results.

## 2011-04-01 ENCOUNTER — Encounter: Payer: Self-pay | Admitting: Family Medicine

## 2011-04-01 NOTE — Assessment & Plan Note (Signed)
Some improvement on gabapentin 100 mg twice a day. Patient to increase gabapentin to 3 times a day 100 mg. Wife may titrate each dose by 100 mg a day, for example, she may increase the morning dose to 200 mg gabapentin followed by 100 mg in midday than 100 mg at night, then 200 mg in the morning, 200 mg in the midday and 100 mg at night. She can titrate up to 200 mg 3 times a day.  Patient may contact a doctor's office to discuss titrations or concerns about intolerance of medication titration.

## 2011-04-01 NOTE — Assessment & Plan Note (Signed)
Lab Results  Component Value Date   CREATININE 1.22 03/28/2011   BUN 15 03/28/2011   NA 136 03/28/2011   K 4.4 03/28/2011   CL 100 03/28/2011   CO2 28 03/28/2011   Impression: Stable

## 2011-04-01 NOTE — Assessment & Plan Note (Signed)
Adequate glycemic control. Tolerating medications.  No new end-organ damage.  Continue current medications.  

## 2011-04-01 NOTE — Progress Notes (Signed)
  Subjective:    Patient ID: Carlos Andrews, male    DOB: 12/24/1932, 77 y.o.   MRN: [id number]  HPI Peripheral Neuropathy Patient found 300mg  gabapentin too sedating.  He takes 100 mg gabapentin twice a day without problems and has inadequate control of the "pins and Needles" sensation in his forelegs (not feet).      Physical Therapy to helped with his balance.  Tolerating gabapentin  Plan to continue current tx, but would like to know if he can increase the dose.   DIABETES Disease Monitoring   Blood Sugar ranges:78 - 120 at home   Polyuria:none   Visual problems:no change  Medications   Compliance:yes. glimpieride half tablet twice a day Side effects   Hypoglycemic symptoms:none  Prevention   Eye exam UTD:   Monitoring feet:daily   Diet pattern:watches fat and salt in diet   Exercise:        HYPERTENSION Disease Monitoring   Blood pressure range:4 home readings 119 to 131/ 78 to 87   Chest pain:none   Dyspnea:none   Claudication:none  Medications   Compliance: taking clonidine and lisiopril. Side effects   Lightheadedness:none   Urinary frequency:none   Edema:ankle edema bilaterally (Chronic)  Review of Systems     Objective:   Physical Exam  Constitutional: He appears well-nourished.  Neck: No JVD present.  Cardiovascular: Normal rate, regular rhythm and intact distal pulses.  PMI is not displaced.   No murmur heard.      Bilateral chronic lower extremity edema with wooden skin changes  Pulmonary/Chest: Effort normal and breath sounds normal.          Assessment & Plan:

## 2011-04-02 ENCOUNTER — Encounter: Payer: Self-pay | Admitting: Family Medicine

## 2011-05-07 ENCOUNTER — Telehealth: Payer: Self-pay | Admitting: Family Medicine

## 2011-05-07 NOTE — Telephone Encounter (Signed)
Is calling about his diabetic shoes.  The doctor told him he would send to [facility].  Wants to know status

## 2011-05-09 NOTE — Telephone Encounter (Signed)
I believe I filled out the diabetic shoe form because I recall talking to [facility] supply where to fax the authorization form.

## 2011-05-09 NOTE — Telephone Encounter (Signed)
Ok, noted.   Will await next week after all get settled.Carlos Andrews, Carlos Andrews

## 2011-05-09 NOTE — Telephone Encounter (Signed)
I sent orders for diabetic deep shoes at patients last visit.  If his medical supply shop does not have the order for deep diabetic shoes, please call the order into the patient's medical supply shop.  Indication for shoes is Diabetes Mellitus Type two with peripheral neuropathy.

## 2011-05-09 NOTE — Telephone Encounter (Signed)
[employer], They are int he process of moving right now.  Delorise Shiner states that if they have the paperwork it will be processed as soon as they get settled in.  Ask that is pt has not heard back from them by Thursday to give them a call back.  Pt informed and agreeable.  Dr. McDiarmid, Do you remember getting forms from [facility]?

## 2011-06-17 ENCOUNTER — Telehealth: Payer: Self-pay | Admitting: Family Medicine

## 2011-06-17 NOTE — Telephone Encounter (Signed)
[facility] pharmacy is taking over for [facility] and they the doctor to send new scripts in for: Clonidine Lisinopril Glimepiride

## 2011-06-17 NOTE — Telephone Encounter (Signed)
Please contact patient for telephone and fax number for the [facility] pharmacy where Dr Vanya Carberry should send patient's prescriptions.

## 2011-06-17 NOTE — Telephone Encounter (Signed)
States the Fax number is [phone]

## 2011-06-18 NOTE — Telephone Encounter (Signed)
I cannot find a mail order pharmacy in our [facility] pharmacy database for a [facility] with the fax number [phone].  I spoke with Carlos Andrews by phone today.  He said that [facility] had called him this morning to let him know his medications had been shipped today to him.    I asked patient to let me know if he does not receive his medications by the end of the week.  If not, I will manually fax prescriptions to the fax no. Above.

## 2011-07-03 ENCOUNTER — Telehealth: Payer: Self-pay | Admitting: Family Medicine

## 2011-07-03 NOTE — Telephone Encounter (Signed)
Carlos Andrews haven't received the meds for Lisinopril,clonidine and glimepride.  Called his pharmacy and they told him that they never received rxs from provider for them.  Need these faxed to number given from previous telephone call.

## 2011-07-03 NOTE — Telephone Encounter (Signed)
Patient needs rx's faxed to his pharmacy at [phone]. Will forward to MD

## 2011-07-04 ENCOUNTER — Other Ambulatory Visit: Payer: Self-pay | Admitting: Family Medicine

## 2011-07-04 MED ORDER — GABAPENTIN 100 MG PO CAPS: ORAL_CAPSULE | ORAL | Status: DC

## 2011-07-04 MED ORDER — LISINOPRIL 20 MG PO TABS: 20.0000 mg | ORAL_TABLET | Freq: Every day | ORAL | Status: DC

## 2011-07-04 MED ORDER — GLIMEPIRIDE 2 MG PO TABS: 1.0000 mg | ORAL_TABLET | Freq: Two times a day (BID) | ORAL | Status: DC

## 2011-07-04 MED ORDER — CLONIDINE HCL 0.2 MG PO TABS: 0.2000 mg | ORAL_TABLET | Freq: Two times a day (BID) | ORAL | Status: DC

## 2011-07-04 NOTE — Progress Notes (Signed)
I printed and fax'd 3 months supplies with 3 refills for patients: Conidine Gabapentin Glimepiride Lisionpril To [phone]

## 2011-07-09 ENCOUNTER — Encounter: Payer: Self-pay | Admitting: Family Medicine

## 2011-07-09 ENCOUNTER — Other Ambulatory Visit: Payer: Self-pay | Admitting: Family Medicine

## 2011-07-09 MED ORDER — GABAPENTIN 100 MG PO CAPS: ORAL_CAPSULE | ORAL | Status: DC

## 2011-09-24 ENCOUNTER — Other Ambulatory Visit: Payer: Self-pay | Admitting: Family Medicine

## 2011-09-24 MED ORDER — GABAPENTIN 100 MG PO CAPS: ORAL_CAPSULE | ORAL | Status: DC

## 2011-10-29 ENCOUNTER — Encounter: Payer: Self-pay | Admitting: Family Medicine

## 2011-10-29 ENCOUNTER — Other Ambulatory Visit: Payer: Self-pay | Admitting: Family Medicine

## 2011-10-29 DIAGNOSIS — E118 Type 2 diabetes mellitus with unspecified complications: Secondary | ICD-10-CM

## 2011-10-29 MED ORDER — GLUCOSE BLOOD VI STRP: ORAL_STRIP | Status: DC

## 2012-01-21 ENCOUNTER — Telehealth: Payer: Self-pay | Admitting: Family Medicine

## 2012-01-21 NOTE — Telephone Encounter (Signed)
Disability Parking Placard Renewal to be completed by McDiarmid,

## 2012-01-23 NOTE — Telephone Encounter (Signed)
[name] notified Handicap Placard is ready to be picked up at front desk.  Ileana Ladd

## 2012-02-20 ENCOUNTER — Ambulatory Visit (INDEPENDENT_AMBULATORY_CARE_PROVIDER_SITE_OTHER): Payer: Medicare Other | Admitting: Family Medicine

## 2012-02-20 ENCOUNTER — Encounter: Payer: Self-pay | Admitting: Family Medicine

## 2012-02-20 VITALS — BP 160/86 | HR 87 | Temp 98.0°F | Ht 64.5 in | Wt 173.9 lb

## 2012-02-20 DIAGNOSIS — E118 Type 2 diabetes mellitus with unspecified complications: Secondary | ICD-10-CM

## 2012-02-20 DIAGNOSIS — Z79899 Other long term (current) drug therapy: Secondary | ICD-10-CM

## 2012-02-20 DIAGNOSIS — G609 Hereditary and idiopathic neuropathy, unspecified: Secondary | ICD-10-CM

## 2012-02-20 DIAGNOSIS — R269 Unspecified abnormalities of gait and mobility: Secondary | ICD-10-CM

## 2012-02-20 LAB — BASIC METABOLIC PANEL
CO2: 28 mEq/L (ref 19–32)
Chloride: 104 mEq/L (ref 96–112)
Creat: 1.27 mg/dL (ref 0.50–1.35)
Glucose, Bld: 106 mg/dL — ABNORMAL HIGH (ref 70–99)

## 2012-02-20 LAB — POCT GLYCOSYLATED HEMOGLOBIN (HGB A1C): Hemoglobin A1C: 5.5

## 2012-02-20 MED ORDER — LISINOPRIL 20 MG PO TABS: 20.0000 mg | ORAL_TABLET | Freq: Every day | ORAL | Status: DC

## 2012-02-20 MED ORDER — CLONIDINE HCL 0.2 MG PO TABS: 0.1000 mg | ORAL_TABLET | Freq: Two times a day (BID) | ORAL | Status: DC

## 2012-02-20 MED ORDER — GLIMEPIRIDE 2 MG PO TABS: 1.0000 mg | ORAL_TABLET | Freq: Two times a day (BID) | ORAL | Status: DC

## 2012-02-20 MED ORDER — GABAPENTIN 100 MG PO CAPS: 100.0000 mg | ORAL_CAPSULE | Freq: Two times a day (BID) | ORAL | Status: DC

## 2012-02-20 NOTE — Assessment & Plan Note (Signed)
Stable. No further workup.   Postural ataxia due to peripheral neuropathy. No postural ataxia on testing of arms.

## 2012-02-20 NOTE — Assessment & Plan Note (Signed)
Adequate glycemic control. Tolerating medications.  No new end-organ damage.  Continue current medications.  

## 2012-02-20 NOTE — Progress Notes (Signed)
  Subjective:    Patient ID: Carlos Andrews, male    DOB: December 26, 1932, 78 y.o.   MRN: [id number]  HPI Pt accompanied by his wife.  Peripheral Neuropathy    He takes 100 mg gabapentin twice a day without problems and has inadequate control of the "pins and Needles" sensation in his forelegs (not feet).  Physical Therapy to helped with his balance, though he still feels unsteady walking Using walker around house.  No falls in over 6 months.  No vertigo. No nausea.    DIABETES  Disease Monitoring  Blood Sugar ranges: at home 90 to 110 fasting.  Polyuria:none  Visual problems:no change  Medications  Compliance:yes. glimpieride half tablet twice a day  Side effects  Hypoglycemic symptoms:none  Prevention  Eye exam UTD: Recent optometrist exam. No cataracts.  Needed new Rx.  Monitoring feet:daily - wife examines his feet daily.  Diet pattern:watches fat and salt in diet  Exercise:   HYPERTENSION  Disease Monitoring  Blood pressure range:4 home readings 119 to 131/ 78 to 87  Chest pain:none  Dyspnea:none  Claudication:none  Medications  Compliance: taking clonidine only once a day and lisiopril.  Side effects  Lightheadedness:none  Urinary frequency:none  Edema:ankle edema bilaterally, no change from baserline   No smoking.    Review of Systems     Objective:   Physical Exam  Constitutional: Vital signs are normal. He is cooperative. No distress.  HENT:  Right Ear: Tympanic membrane and ear canal normal.  Left Ear: Tympanic membrane and ear canal normal.  Eyes: Right conjunctiva is not injected.  Neck: No mass and no thyromegaly present.  Cardiovascular: Normal rate, regular rhythm and normal heart sounds.   Pulmonary/Chest: Effort normal and breath sounds normal.  Neurological:       Gait: forward lean on [place]laker from waist. Right leg dragged compared to left with swing phase.    Feet: hallucis valgus left great toe. Hammer toes.  No pressure points.  No  calluses.  Intact to monofilament 5/5 points bilateral forefeet.        Assessment & Plan:

## 2012-02-20 NOTE — Assessment & Plan Note (Signed)
Clinically stable. Check BMET today. Pt taking potassium supplement'

## 2012-02-20 NOTE — Assessment & Plan Note (Signed)
Stable. No further workup. Adequate symptom control. Tolerating gabapentin 100 mg twice a day. Continue current medications.

## 2012-02-20 NOTE — Patient Instructions (Signed)
Review Advanced Directive education packet.  Your hypertension and diabetes is under good control.

## 2012-02-21 ENCOUNTER — Encounter: Payer: Self-pay | Admitting: Family Medicine

## 2012-02-25 ENCOUNTER — Other Ambulatory Visit: Payer: Self-pay | Admitting: Family Medicine

## 2012-02-26 ENCOUNTER — Other Ambulatory Visit: Payer: Self-pay | Admitting: Family Medicine

## 2012-02-26 MED ORDER — GABAPENTIN 100 MG PO CAPS: 100.0000 mg | ORAL_CAPSULE | Freq: Three times a day (TID) | ORAL | Status: DC

## 2012-02-26 NOTE — Progress Notes (Signed)
Sent Rx for Gabapentin to patient's mail order pharmacy.

## 2012-04-09 ENCOUNTER — Telehealth: Payer: Self-pay | Admitting: *Deleted

## 2012-04-09 NOTE — Telephone Encounter (Signed)
Valerie with Dr. Beverlee Nims office calling about Carlos Andrews.  They sent him to [facility] for dopplers which came back abnormal and now he needs to be referred to a Vascular Specialist.  Patient does not want to go back to [facility] because the halls are too long for him to have to walk.  Valentino Saxon to fax over Doppler report and I would have Dr. Deirdre Priest to review results and order referral. Ileana Ladd

## 2012-04-10 NOTE — Telephone Encounter (Signed)
Please call and inform that   Since we did not order nor interpret this test we can not make a referral.  If patient does not want to go where Dr Charlsie Merles referred him them he should contact Dr Charlsie Merles. Other wise we would be happy to see him in the office to evaluate him and determine if and where he needs a referral Thanks LC

## 2012-04-10 NOTE — Telephone Encounter (Signed)
Spoke with Carlos Andrews at Dr. Beverlee Nims office to inform her of the below message from Dr. Deirdre Priest.  She said they would do the referral but ask who we normally refer to.  I told Carlos Andrews I only knew of  [facility] [address].  Carlos Andrews

## 2012-04-20 ENCOUNTER — Encounter: Payer: Self-pay | Admitting: Vascular Surgery

## 2012-04-21 ENCOUNTER — Encounter: Payer: Self-pay | Admitting: Vascular Surgery

## 2012-04-21 ENCOUNTER — Ambulatory Visit (INDEPENDENT_AMBULATORY_CARE_PROVIDER_SITE_OTHER): Payer: Medicare Other | Admitting: Vascular Surgery

## 2012-04-21 VITALS — BP 169/88 | HR 93 | Ht 64.5 in | Wt 181.7 lb

## 2012-04-21 DIAGNOSIS — I739 Peripheral vascular disease, unspecified: Secondary | ICD-10-CM

## 2012-04-21 DIAGNOSIS — I70219 Atherosclerosis of native arteries of extremities with intermittent claudication, unspecified extremity: Secondary | ICD-10-CM | POA: Insufficient documentation

## 2012-04-21 HISTORY — DX: Peripheral vascular disease, unspecified: I73.9

## 2012-04-21 NOTE — Progress Notes (Signed)
Subjective:     Patient ID: Carlos Andrews, male   DOB: November 11, 1932, 78 y.o.   MRN: [id number]  HPI are all male was referred for evaluation of lower extremity occlusive disease. The patient has discomfort in his legs when he ambulates. He has been ambulating with a walker for at least one year partially because of leg discomfort but partially because of balance problems. He has no history of CVA. He has no history of gangrene nonhealing ulcers or infection. He states that his legs do not hurt at night nor keep him awake and he has good feeling in the feet. He does have type 2 diabetes mellitus.  Past Medical History  Diagnosis Date  . DIABETES MELLITUS, II, COMPLICATIONS 08/07/2006  . Esophageal perforation 1993    managed at [facility]  . PPD positive 1993    Found at [facility]  . Spinal stenosis of lumbar region at multiple levels     History  Substance Use Topics  . Smoking status: Former Smoker    Types: Cigarettes    Quit date: 04/22/1987  . Smokeless tobacco: Never Used  . Alcohol Use: No    Family History  Problem Relation Age of Onset  . Diabetes Mother   . Diabetes Father   . Hypertension Father   . Other Father     amputaion  . Diabetes Son   . Hypertension Son     Allergies  Allergen Reactions  . Naprosyn (Naproxen) Other (See Comments)    GI upset  . Rofecoxib     REACTION: dizzy  . Tramadol Hcl     REACTION: vomiting    Current outpatient prescriptions:acetaminophen (TYLENOL) 650 MG CR tablet, Take 600 mg by mouth every 8 (eight) hours as needed.  , Disp: , Rfl: ;  Ascorbic Acid (VITAMIN C) 500 MG CHEW, Chew by mouth daily.  , Disp: , Rfl: ;  aspirin 81 MG tablet, Take 81 mg by mouth daily.  , Disp: , Rfl: ;  calcium-vitamin D (OSCAL WITH D 250-125) 250-125 MG-UNIT per tablet, Take 1 tablet by mouth daily.  , Disp: , Rfl:  cloNIDine (CATAPRES) 0.2 MG tablet, Take 0.5 tablets (0.1 mg total) by mouth 2 (two) times daily., Disp: 90 tablet, Rfl: 3;  Elastic Bandages &  Supports (V-2 HIGH COMPRESSION HOSE) MISC, Put on in morning, remove at bedtime. , Disp: , Rfl: ;  Fish Oil OIL, Take 1,000 mg by mouth daily. , Disp: , Rfl: ;  gabapentin (NEURONTIN) 100 MG capsule, Take 1 capsule (100 mg total) by mouth 3 (three) times daily., Disp: 270 capsule, Rfl: 3 glimepiride (AMARYL) 2 MG tablet, Take 0.5 tablets (1 mg total) by mouth 2 (two) times daily., Disp: 90 tablet, Rfl: 3;  lisinopril (PRINIVIL,ZESTRIL) 20 MG tablet, Take 1 tablet (20 mg total) by mouth at bedtime., Disp: 90 tablet, Rfl: 3;  Multiple Vitamin (MULTIVITAMIN) tablet, Take 1 tablet by mouth daily.  , Disp: , Rfl: ;  Pyridoxine HCl (VITAMIN B-6) 100 MG tablet, 3 tabs once daily. , Disp: , Rfl:  ReliOn Alcohol Swabs PADS, Check blood sugar twice daily. , Disp: , Rfl: ;  RELION LANCETS THIN 26G MISC, Check blood sugar twice daily, Disp: 100 each, Rfl: PRN;  vitamin B-12 (CYANOCOBALAMIN) 1000 MCG tablet, Take 1,000 mcg by mouth daily.  , Disp: , Rfl: ;  vitamin E (VITAMIN E) 400 UNIT capsule, Take 400 Units by mouth daily.  , Disp: , Rfl:  Blood Glucose Monitoring Suppl (RELION CONFIRM  GLUCOSE MONITOR) W/DEVICE KIT, Check blood sugar twice a day. , Disp: , Rfl: ;  glucose blood (RELION GLUCOSE TEST STRIPS) test strip, Test blood sugar once a day., Disp: 50 each, Rfl: 5;  Potassium Gluconate 595 MG TABS, Take by mouth. 1/2 pill once daily  , Disp: , Rfl:   BP 169/88  Pulse 93  Ht 5' 4.5" (1.638 m)  Wt 181 lb 11.2 oz (82.419 kg)  BMI 30.71 kg/m2  SpO2 100%  Body mass index is 30.71 kg/(m^2).           Review of Systems denies chest pain. Does have severe back discomfort with history of spinal stenosis at multiple levels. Complains of poor balance but denies CVA. No history of coronary artery disease. Denies lateralizing weakness, aphasia, amaurosis fugax, or syncope. Other systems negative    Objective:   Physical Exam blood pressure 169 radiate heart rate 93 respirations 16 Gen.-alert and oriented  x3 in no apparent distress HEENT normal for age Lungs no rhonchi or wheezing Cardiovascular regular rhythm no murmurs carotid pulses 3+ palpable no bruits audible Abdomen soft nontender no palpable masses-obese  Musculoskeletal free of  major deformities Skin clear -no rashes Neurologic normal Lower extremities 3+ femoral and popliteal pulses palpable bilaterally. No DP or PT pulses bilaterally. No evidence of infection, gangrene, ulceration, or other limb threatening problems in feet. Intact sensation and motion. No calf tenderness.  I reviewed the lower extremity arterial study performed at [facility] on 04/03/2012. Patient has severe bilateral tibial occlusive disease.      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 Cautioned patient and wife about developing pressure sores or injury to feet that it would be difficult to heal these

## 2012-05-11 ENCOUNTER — Other Ambulatory Visit: Payer: Self-pay | Admitting: *Deleted

## 2012-05-11 MED ORDER — GLIMEPIRIDE 2 MG PO TABS: 1.0000 mg | ORAL_TABLET | Freq: Two times a day (BID) | ORAL | Status: DC

## 2012-05-14 ENCOUNTER — Other Ambulatory Visit: Payer: Self-pay | Admitting: *Deleted

## 2012-05-14 DIAGNOSIS — I10 Essential (primary) hypertension: Secondary | ICD-10-CM

## 2012-05-14 MED ORDER — LISINOPRIL 20 MG PO TABS: 20.0000 mg | ORAL_TABLET | Freq: Every day | ORAL | Status: DC

## 2012-05-14 NOTE — Telephone Encounter (Addendum)
Received refill request for lisinopril from [facility].  It appears Dr. McDiarmid sent in in Sept but the " print " was selected instead of Normal.  I resent  with 2 refills.

## 2012-07-01 ENCOUNTER — Telehealth: Payer: Self-pay | Admitting: Family Medicine

## 2012-07-01 DIAGNOSIS — E118 Type 2 diabetes mellitus with unspecified complications: Secondary | ICD-10-CM

## 2012-07-01 NOTE — Telephone Encounter (Signed)
Had to change pharmacies because of his insurance and he was needing a refill on his lancets and test strips - [facility] uses and they told him that he needs to have his doctor send a script in for a new meter/strips and lancets. [facility] [address]

## 2012-07-02 MED ORDER — ONETOUCH ULTRA SYSTEM W/DEVICE KIT: 1.0000 | PACK | Freq: Once | Status: DC

## 2012-07-02 NOTE — Telephone Encounter (Signed)
Please let patient know that a new glucometer kit [facility] as requested.

## 2012-07-03 NOTE — Telephone Encounter (Signed)
Pt informed. Fleeger, Jessica Dawn  

## 2012-07-13 ENCOUNTER — Other Ambulatory Visit: Payer: Self-pay | Admitting: Family Medicine

## 2012-07-13 DIAGNOSIS — E119 Type 2 diabetes mellitus without complications: Secondary | ICD-10-CM

## 2012-07-13 MED ORDER — RELION LANCETS ULTRA-THIN 30G MISC: 1.0000 | Freq: Two times a day (BID) | Status: DC

## 2012-07-20 ENCOUNTER — Other Ambulatory Visit: Payer: Self-pay | Admitting: *Deleted

## 2012-07-20 MED ORDER — CLONIDINE HCL 0.2 MG PO TABS: 0.1000 mg | ORAL_TABLET | Freq: Two times a day (BID) | ORAL | Status: DC

## 2012-08-03 ENCOUNTER — Other Ambulatory Visit: Payer: Self-pay | Admitting: Family Medicine

## 2012-08-03 ENCOUNTER — Telehealth: Payer: Self-pay | Admitting: Family Medicine

## 2012-08-03 MED ORDER — CLONIDINE HCL 0.2 MG PO TABS: 0.1000 mg | ORAL_TABLET | Freq: Two times a day (BID) | ORAL | Status: DC

## 2012-08-03 NOTE — Telephone Encounter (Signed)
Pt states that [facility] has not rec'd refill on his clonidine and it looks like it went to [facility] instead - needs to know if this can be switched to [facility]

## 2012-08-07 ENCOUNTER — Other Ambulatory Visit: Payer: Self-pay | Admitting: *Deleted

## 2012-08-07 MED ORDER — CLONIDINE HCL 0.2 MG PO TABS: 0.1000 mg | ORAL_TABLET | Freq: Two times a day (BID) | ORAL | Status: DC

## 2012-10-22 ENCOUNTER — Telehealth: Payer: Self-pay | Admitting: Family Medicine

## 2012-10-22 DIAGNOSIS — E118 Type 2 diabetes mellitus with unspecified complications: Secondary | ICD-10-CM

## 2012-10-22 NOTE — Telephone Encounter (Signed)
Patient is calling because his pharmacy asked him to call for his refill on his Relion test strips.  They have attempted to send the refill request by phone for 3 days but have not heard back.

## 2012-10-26 MED ORDER — GLUCOSE BLOOD VI STRP: ORAL_STRIP | Status: DC

## 2012-10-26 NOTE — Telephone Encounter (Signed)
Refilled and pt informed. Carlos Andrews, Carlos Andrews  Originally sent to [facility], but pt does not want it there.  [facility].  [facility] at [facility] and [facility] to [facility] [address]. Carlos Andrews, Carlos Andrews

## 2012-10-26 NOTE — Telephone Encounter (Signed)
Pt is calling again about his test strips - needs asap

## 2012-11-16 ENCOUNTER — Telehealth: Payer: Self-pay | Admitting: Family Medicine

## 2012-11-16 MED ORDER — CLONIDINE HCL 0.2 MG PO TABS: 0.1000 mg | ORAL_TABLET | Freq: Two times a day (BID) | ORAL | Status: DC

## 2012-11-16 NOTE — Telephone Encounter (Signed)
Patient is calling because he is out of refills on Clonidine and [facility] is going to need a new Rx.

## 2012-11-16 NOTE — Telephone Encounter (Signed)
Will forward to [place].

## 2012-11-16 NOTE — Telephone Encounter (Signed)
Last vist with Dr Levonne Lapping  9-13 Will give 2 months then needs OFFICE VISIT Please inform  Thanks

## 2012-11-17 NOTE — Telephone Encounter (Signed)
Pt is aware that rx was sent to pharmacy and that he needs to call back in July to make an appt to see his pcp.  Jazmin Hartsell, CMA

## 2012-12-17 ENCOUNTER — Other Ambulatory Visit: Payer: Self-pay

## 2013-02-18 ENCOUNTER — Telehealth: Payer: Self-pay | Admitting: Family Medicine

## 2013-02-18 DIAGNOSIS — I10 Essential (primary) hypertension: Secondary | ICD-10-CM

## 2013-02-18 NOTE — Telephone Encounter (Signed)
Pt called because he didn't get he refill on lisinopril, he would like [place] to send in this request. JW

## 2013-02-19 MED ORDER — LISINOPRIL 20 MG PO TABS: 20.0000 mg | ORAL_TABLET | Freq: Every day | ORAL | Status: DC

## 2013-02-19 NOTE — Telephone Encounter (Signed)
Refill of Lisinopril sent to [facility] mail order pharmacy

## 2013-02-22 ENCOUNTER — Encounter: Payer: Self-pay | Admitting: Family Medicine

## 2013-02-22 ENCOUNTER — Ambulatory Visit (INDEPENDENT_AMBULATORY_CARE_PROVIDER_SITE_OTHER): Payer: Medicare Other | Admitting: Family Medicine

## 2013-02-22 VITALS — BP 130/85 | HR 88 | Temp 98.2°F | Ht 64.5 in | Wt 173.0 lb

## 2013-02-22 DIAGNOSIS — L97521 Non-pressure chronic ulcer of other part of left foot limited to breakdown of skin: Secondary | ICD-10-CM

## 2013-02-22 DIAGNOSIS — E785 Hyperlipidemia, unspecified: Secondary | ICD-10-CM

## 2013-02-22 DIAGNOSIS — Z23 Encounter for immunization: Secondary | ICD-10-CM

## 2013-02-22 DIAGNOSIS — Z79899 Other long term (current) drug therapy: Secondary | ICD-10-CM

## 2013-02-22 DIAGNOSIS — I739 Peripheral vascular disease, unspecified: Secondary | ICD-10-CM

## 2013-02-22 DIAGNOSIS — I10 Essential (primary) hypertension: Secondary | ICD-10-CM

## 2013-02-22 DIAGNOSIS — L97509 Non-pressure chronic ulcer of other part of unspecified foot with unspecified severity: Secondary | ICD-10-CM

## 2013-02-22 DIAGNOSIS — M1710 Unilateral primary osteoarthritis, unspecified knee: Secondary | ICD-10-CM

## 2013-02-22 DIAGNOSIS — E119 Type 2 diabetes mellitus without complications: Secondary | ICD-10-CM

## 2013-02-22 LAB — BASIC METABOLIC PANEL
CO2: 28 mEq/L (ref 19–32)
Calcium: 9.4 mg/dL (ref 8.4–10.5)
Creat: 1.22 mg/dL (ref 0.50–1.35)
Glucose, Bld: 84 mg/dL (ref 70–99)

## 2013-02-22 NOTE — Patient Instructions (Signed)
Your blood sugars and blood pressures are under good control.  It is time for you to have your annual diabetic eye exam.  Dr Katalia Choma will call you if your tests are not good. Otherwise he will send you a letter.  If you sign up for MyChart online, you will be able to see your test results once Dr Ernestene Coover has reviewed them.  If you do not hear from [place] with in 2 weeks please call our office

## 2013-02-23 ENCOUNTER — Encounter: Payer: Self-pay | Admitting: Family Medicine

## 2013-02-23 DIAGNOSIS — L97509 Non-pressure chronic ulcer of other part of unspecified foot with unspecified severity: Secondary | ICD-10-CM

## 2013-02-23 DIAGNOSIS — H524 Presbyopia: Secondary | ICD-10-CM | POA: Insufficient documentation

## 2013-02-23 DIAGNOSIS — H52229 Regular astigmatism, unspecified eye: Secondary | ICD-10-CM

## 2013-02-23 DIAGNOSIS — H5212 Myopia, left eye: Secondary | ICD-10-CM

## 2013-02-23 DIAGNOSIS — H02402 Unspecified ptosis of left eyelid: Secondary | ICD-10-CM

## 2013-02-23 DIAGNOSIS — L89622 Pressure ulcer of left heel, stage 2: Secondary | ICD-10-CM | POA: Insufficient documentation

## 2013-02-23 DIAGNOSIS — H40013 Open angle with borderline findings, low risk, bilateral: Secondary | ICD-10-CM

## 2013-02-23 DIAGNOSIS — H02401 Unspecified ptosis of right eyelid: Secondary | ICD-10-CM | POA: Insufficient documentation

## 2013-02-23 HISTORY — DX: Open angle with borderline findings, low risk, bilateral: H40.013

## 2013-02-23 HISTORY — DX: Unspecified ptosis of left eyelid: H02.402

## 2013-02-23 HISTORY — DX: Presbyopia: H52.12

## 2013-02-23 HISTORY — DX: Regular astigmatism, unspecified eye: H52.229

## 2013-02-23 HISTORY — DX: Presbyopia: H52.4

## 2013-02-23 HISTORY — DX: Non-pressure chronic ulcer of other part of unspecified foot with unspecified severity: L97.509

## 2013-02-23 MED ORDER — GABAPENTIN 100 MG PO CAPS: 100.0000 mg | ORAL_CAPSULE | Freq: Three times a day (TID) | ORAL | Status: DC

## 2013-02-23 MED ORDER — LISINOPRIL 20 MG PO TABS: 20.0000 mg | ORAL_TABLET | Freq: Every day | ORAL | Status: DC

## 2013-02-23 MED ORDER — GLIMEPIRIDE 2 MG PO TABS: 1.0000 mg | ORAL_TABLET | Freq: Two times a day (BID) | ORAL | Status: DC

## 2013-02-23 MED ORDER — CLONIDINE HCL 0.2 MG PO TABS: 0.1000 mg | ORAL_TABLET | Freq: Two times a day (BID) | ORAL | Status: DC

## 2013-02-23 NOTE — Assessment & Plan Note (Signed)
New problem for patient Partial thickness wound. No evidence of infection. 2 cm by 1.5 cm by 3 mm deep.  Dry base. Etiology: patient picked at callus on heel with fingernails. Patient with know PAD with Severe tibial occlusive disease by Dr Hart Rochester (Vascular Surgery)..   Plan Colloidal dressing weekly Keep leg elevated. Wife to monitor for infection or non-healing.

## 2013-02-23 NOTE — Assessment & Plan Note (Signed)
Using OTC Aleve 220 mg twice a day which helps more with his OA than APAP. Advised patient of risk of NSAIDS. Basic Metabolic Panel:    Component Value Date/Time   NA 141 02/22/2013 1440   K 4.3 02/22/2013 1440   CL 105 02/22/2013 1440   CO2 28 02/22/2013 1440   BUN 20 02/22/2013 1440   CREATININE 1.22 02/22/2013 1440   CREATININE 1.28 04/02/2010 1750   GLUCOSE 84 02/22/2013 1440   CALCIUM 9.4 02/22/2013 1440   No rise in creatinine.  MDRD e GFR 76 ml/min.   It is all right for patient to continue on daily Aleve as it appears to be symptomatically helping paitne

## 2013-02-23 NOTE — Assessment & Plan Note (Signed)
Need to discuss use of statins next office visit.

## 2013-02-23 NOTE — Progress Notes (Signed)
Patient ID: LAZARUS SUDBURY, male   DOB: [birth year], 79 y.o.   MRN: [id number]  Subjective:    Patient ID: EAN GETTEL, male    DOB: September 03, 1932, 79 y.o.   MRN: [id number]  HPI Pt accompanied by his wife.  Peripheral Neuropathy    He takes 100 mg gabapentin twice a day without problems and has inadequate control of the "pins and Needles" sensation in his forelegs (not feet).  Physical Therapy to helped with his balance, though he still feels unsteady walking Using walker around house.  No falls in over 6 months.  No vertigo. No nausea.    DIABETES  Disease Monitoring  Blood Sugar ranges: at home 90 to 110 fasting.  Polyuria:none  Visual problems:no change  Medications  Compliance:yes. glimpieride half tablet twice a day  Side effects  Hypoglycemic symptoms:none  Prevention  Eye exam UTD: Recent optometrist exam. No cataracts.  Needed new Rx.  Monitoring feet:daily - wife examines his feet daily.  Diet pattern:watches fat and salt in diet    HYPERTENSION  Disease Monitoring  Blood pressure range:4 home readings 119 to 131/ 78 to 87  Chest pain:none  Dyspnea:none  Claudication:none  Medications  Compliance: taking clonidine only once a day and lisiopril.  Side effects  Lightheadedness:none  Urinary frequency:none  Edema:ankle edema bilaterally, no change from baseline. Improves with elevation of legs.    No smoking.    Review of Systems See HPI    Objective:   Physical Exam  Constitutional: Vital signs are normal. He is cooperative. No distress.  HENT:  Right Ear: Tympanic membrane and ear canal normal.  Left Ear: Tympanic membrane and ear canal normal.  Eyes: Right conjunctiva is not injected.  Neck: No mass and no thyromegaly present.  Cardiovascular: Normal rate, regular rhythm and normal heart sounds.   Pulmonary/Chest: Effort normal and breath sounds normal.   Feet: hallucis valgus left great toe. Hammer toes.  No pressure points.  Bilateral posterior  heel calluses.  Intact to monofilament 5/5 points bilateral forefeet.   Left posterior heel with 2 cm elliptical ulcer thru to dermis. Healing granualtion tisse. Dry base.  Callus skin around wound.  No erythema or exudate.  Non tender.       Assessment & Plan:

## 2013-02-23 NOTE — Assessment & Plan Note (Signed)
Adequate blood pressure control.  No evidence of new end organ damage.  Tolerating medication without significant adverse effects.  Plan to continue current blood pressure regiment.   

## 2013-03-22 ENCOUNTER — Other Ambulatory Visit: Payer: Self-pay | Admitting: Family Medicine

## 2013-03-22 ENCOUNTER — Telehealth: Payer: Self-pay | Admitting: Family Medicine

## 2013-03-22 MED ORDER — GABAPENTIN 100 MG PO CAPS: 100.0000 mg | ORAL_CAPSULE | Freq: Three times a day (TID) | ORAL | Status: DC

## 2013-03-22 MED ORDER — GLIMEPIRIDE 2 MG PO TABS: 1.0000 mg | ORAL_TABLET | Freq: Two times a day (BID) | ORAL | Status: DC

## 2013-03-22 NOTE — Telephone Encounter (Signed)
Pt is requesting a refill of glimapiride sent to his pharmacy on file. JW

## 2013-03-22 NOTE — Telephone Encounter (Signed)
Will forward to MD. Carlisia Geno,CMA  

## 2013-05-20 ENCOUNTER — Telehealth: Payer: Self-pay | Admitting: Clinical

## 2013-05-20 NOTE — Telephone Encounter (Signed)
Clinical Child psychotherapist (CSW) received a call from pt requesting for CSW to order home assistance for pt spouse. CSW explored what needs pt spouse has: PT/OT/RN or custodial needs (cleaning, cooking, bathing etc.) Spouse stated they are requesting assistance with custodial needs. CSW provided pt with education regarding what his insurance would cover and unfortunately custodial needs are not covered under [facility] and therefore pt would need to pay privately. CSW inquired whether pt has applied/believes he qualifies for Medicaid however pt stated he was sure they would not qualify. CSW offered to mail pt resources on private duty agencies that could provide custodial services. Pt was very appreciative and agreeable to resources being mailed. Pt had no additional concerns.  [name], MSW, LCSW [phone]

## 2013-06-14 ENCOUNTER — Other Ambulatory Visit: Payer: Self-pay | Admitting: Family Medicine

## 2013-06-14 DIAGNOSIS — I10 Essential (primary) hypertension: Secondary | ICD-10-CM

## 2013-06-14 MED ORDER — CLONIDINE HCL 0.2 MG PO TABS: 0.1000 mg | ORAL_TABLET | Freq: Two times a day (BID) | ORAL | Status: DC

## 2013-09-29 ENCOUNTER — Telehealth: Payer: Self-pay | Admitting: Family Medicine

## 2013-09-29 DIAGNOSIS — I10 Essential (primary) hypertension: Secondary | ICD-10-CM

## 2013-09-29 NOTE — Telephone Encounter (Signed)
Mr. Carlos Andrews need refills for his Clonidine and Glimepiride.

## 2013-09-30 MED ORDER — GLIMEPIRIDE 2 MG PO TABS: 1.0000 mg | ORAL_TABLET | Freq: Two times a day (BID) | ORAL | Status: DC

## 2013-09-30 MED ORDER — CLONIDINE HCL 0.2 MG PO TABS: 0.1000 mg | ORAL_TABLET | Freq: Two times a day (BID) | ORAL | Status: DC

## 2013-10-04 ENCOUNTER — Encounter: Payer: Self-pay | Admitting: Family Medicine

## 2013-10-04 ENCOUNTER — Ambulatory Visit (INDEPENDENT_AMBULATORY_CARE_PROVIDER_SITE_OTHER): Payer: Medicare Other | Admitting: Family Medicine

## 2013-10-04 VITALS — BP 138/83 | HR 82 | Temp 97.6°F | Ht 64.5 in | Wt 187.0 lb

## 2013-10-04 DIAGNOSIS — Z9181 History of falling: Secondary | ICD-10-CM

## 2013-10-04 DIAGNOSIS — R296 Repeated falls: Secondary | ICD-10-CM

## 2013-10-04 DIAGNOSIS — L97509 Non-pressure chronic ulcer of other part of unspecified foot with unspecified severity: Secondary | ICD-10-CM

## 2013-10-04 DIAGNOSIS — N183 Chronic kidney disease, stage 3 unspecified: Secondary | ICD-10-CM

## 2013-10-04 DIAGNOSIS — Z23 Encounter for immunization: Secondary | ICD-10-CM

## 2013-10-04 DIAGNOSIS — E119 Type 2 diabetes mellitus without complications: Secondary | ICD-10-CM

## 2013-10-04 DIAGNOSIS — R809 Proteinuria, unspecified: Secondary | ICD-10-CM

## 2013-10-04 DIAGNOSIS — I10 Essential (primary) hypertension: Secondary | ICD-10-CM

## 2013-10-04 HISTORY — DX: Repeated falls: R29.6

## 2013-10-04 HISTORY — DX: History of falling: Z91.81

## 2013-10-04 LAB — POCT GLYCOSYLATED HEMOGLOBIN (HGB A1C): Hemoglobin A1C: 5.4

## 2013-10-04 MED ORDER — CAPSAICIN 0.075 % EX CREA: 1.0000 "application " | TOPICAL_CREAM | Freq: Two times a day (BID) | CUTANEOUS | Status: DC

## 2013-10-04 MED ORDER — CAPSAICIN 0.025 % EX CREA: TOPICAL_CREAM | Freq: Three times a day (TID) | CUTANEOUS | Status: DC

## 2013-10-04 NOTE — Assessment & Plan Note (Signed)
Check Renal panel next office visit.

## 2013-10-04 NOTE — Progress Notes (Signed)
Patient ID: Carlos Andrews, male   DOB: 09/12/1932, 80 y.o.   MRN: [id number]  Subjective:    Patient ID: Carlos Andrews, male    DOB: 09/13/1932, 80 y.o.   MRN: [id number]  HPI Pt accompanied by his wife. Pt and wife were sources of information for the visit.   Peripheral Neuropathy   He takes 100 mg gabapentin twice a day without problems and has inadequate control of the "pins and Needles" sensation in his forelegs (not feet).  Using walker around house.   No vertigo. No nausea.  Excessive sedation per wife when patient takes gabapentin more than twice a day.    DIABETES  Disease Monitoring  Blood Sugar ranges: at home 90 to 110 fasting.  Polyuria:none  Visual problems:no change  Medications  Compliance:yes. glimpieride half tablet twice a day  Side effects  Hypoglycemic symptoms:none  Prevention  Eye exam UTD: Recent optometrist exam. No cataracts.  Needed new Rx.  Monitoring feet:daily - wife examines his feet daily.  Diet pattern:watches fat and salt in diet    HYPERTENSION  Disease Monitoring  Blood pressure range: BP readings at home in 120-130s/ 80s Chest pain:none  Dyspnea:none  Claudication:none  Medications  Compliance: taking clonidine twice once a day and lisinopril.  Side effects  Lightheadedness:none  Urinary frequency:none  Edema: ankle edema bilaterally, no change from baseline. Improves with elevation of legs.  Geriatric Function Screen  Hearing:  - Do you have problems with your hearing? No C. Vision:  - Trouble driving, watching TV, reading or daily activities because of eye sight? no  D. Home environment:  - Trouble with Stairs inside or outside the home? no - Fall in past Year? yes E. ADL/iADL:  - Get out of bed by themself? yes - Dress Themselves? Needs assistance with dressing below waist - Make own meal? no  - Do own shopping? Goes with with shopping  - Handle Own Finances? Yes - Administers his own medications: Yes - Driving? no  F.  Social Support:  - SolicitorContact in case of illness or emergency: Wife G. Nutrition:  - Loss of >= 10 lbs over past 6 months: no - BMI < 22%? no  H. Urinary Incontinence:  - Ever lose urine and get wet? no  I. Depression:  - Feel sad or depressed? no  - 30-sec Sit to Stand test: 8 sit to stand in 30 seconds - Steadiness: Unable to stand with feet together for 5 seconds - Walking with 2-wheeled walker - doing exercises daily he was taught by PT.       No smoking.    Review of Systems See HPI    Objective:   Physical Exam  Constitutional: Vital signs are normal. He is cooperative. No distress.  Neck: No mass and no thyromegaly present.  Cardiovascular: Normal rate, regular rhythm and normal heart sounds.   Pulmonary/Chest: Effort normal and breath sounds normal.   Feet: hallucis valgus left great toe. Hammer toes.  No pressure points.  Bilateral posterior heel calluses. With healing wound under removed left heel callus.  Intact to monofilament 5/5 points bilateral forefeet.     Assessment & Plan:

## 2013-10-04 NOTE — Patient Instructions (Signed)
Ask pharmacist for "Heel Cream", if they don't have that exact brand, ask them for their equivalent.  Blood sugar and blood pressures are great.  Your balance seems to be a major cause of your falls.  Dr Jaxson Keener will contact home health Physical Therapy to see what the cost to you would be of three weeks of home balance training.   You received a Pneumonia vaccination booster today.

## 2013-10-04 NOTE — Assessment & Plan Note (Signed)
Wound healed though overlying posterior heel callus Recommend keratolytic topical cream to heels  Daily inspection by wife.

## 2013-10-04 NOTE — Assessment & Plan Note (Addendum)
While patient has adequate proximal strength, his balance is impaired, likely with contributions of poor proprioception from diabetes and PAD.  Patient and wife are interested in home health PT for balance training.  It is a considerable and taxing effort for patient to come to physician's office, so home health PT will be appropriate. The patient and wife are [facility]   They are going to call there Medicare carrier to see what there complains copay will be. If they can afford the copay, they will contact us to arrange home health PT for Balance Training.   Will reduce patient's clonidine to 1 mg at bedtime only.

## 2013-10-04 NOTE — Assessment & Plan Note (Signed)
Adequate blood pressure control.  No evidence of new end organ damage. Will decrease clonidine dose to 0.1 mg in order to decrease risk of falls and excess daytime sedation.  Will recheck BP next office visit.

## 2013-10-04 NOTE — Assessment & Plan Note (Signed)
Adequate glycemic control.  Will reduce glimepiride to 1 mg at bedtime.

## 2013-10-14 ENCOUNTER — Other Ambulatory Visit: Payer: Self-pay | Admitting: Family Medicine

## 2013-10-14 NOTE — Telephone Encounter (Signed)
Needs test strips Please afdvise

## 2013-10-15 ENCOUNTER — Other Ambulatory Visit: Payer: Self-pay | Admitting: Family Medicine

## 2013-10-15 MED ORDER — GLUCOSE BLOOD VI STRP: 1.0000 | ORAL_STRIP | Freq: Every day | Status: DC

## 2014-01-10 ENCOUNTER — Ambulatory Visit (INDEPENDENT_AMBULATORY_CARE_PROVIDER_SITE_OTHER): Payer: Medicare Other | Admitting: Podiatry

## 2014-01-10 ENCOUNTER — Encounter: Payer: Self-pay | Admitting: Podiatry

## 2014-01-10 VITALS — BP 195/95 | HR 88 | Resp 18 | Ht 68.0 in | Wt 172.0 lb

## 2014-01-10 DIAGNOSIS — B351 Tinea unguium: Secondary | ICD-10-CM

## 2014-01-10 DIAGNOSIS — M79673 Pain in unspecified foot: Secondary | ICD-10-CM

## 2014-01-10 DIAGNOSIS — M79609 Pain in unspecified limb: Secondary | ICD-10-CM

## 2014-01-10 NOTE — Progress Notes (Signed)
   Subjective:    Patient ID: Loran SentersHerman R Lamore, male    DOB: 05/03/1933, 80 y.o.   MRN: [id number]  HPI Comments: N debridement and foot exam L 10 toenails D and O long-term C enlongated, thickened, encurvated toenails A diabetes and difficulty trimming T Dr McDiarmid referred.  Known history of peripheral arterial disease   Review of Systems  All other systems reviewed and are negative.      Objective:   Physical Exam  Orientated x3 black male presents with wife  Vascular: DP and PT pulse s0/4 bilaterally  Neurological: Vibratory sensation nonreactive bilaterally Ankle reflexes reactive bilaterally Sensation to 10 g monofilament wire intact 5/5 right and 4/5 left  Dermatological: Toenails 6-10 are elongated, hypertrophic, discolored, incurvated  Musculoskeletal: HAV deformities bilaterally Symmetrical range of motion ankle, subtalar, midtarsal joints bilaterally       Assessment & Plan:   Assessment: Diminished pedal pulses consistent with known peripheral arterial disease Symptomatic onychomycoses 6-10  Plan: Nails x10 are debrided without any bleeding  Reported at 3 month intervals

## 2014-01-10 NOTE — Patient Instructions (Signed)
Diabetes and Foot Care Diabetes may cause you to have problems because of poor blood supply (circulation) to your feet and legs. This may cause the skin on your feet to become thinner, break easier, and heal more slowly. Your skin may become dry, and the skin may peel and crack. You may also have nerve damage in your legs and feet causing decreased feeling in them. You may not notice minor injuries to your feet that could lead to infections or more serious problems. Taking care of your feet is one of the most important things you can do for yourself.  HOME CARE INSTRUCTIONS  Wear shoes at all times, even in the house. Do not go barefoot. Bare feet are easily injured.  Check your feet daily for blisters, cuts, and redness. If you cannot see the bottom of your feet, use a mirror or ask someone for help.  Wash your feet with warm water (do not use hot water) and mild soap. Then pat your feet and the areas between your toes until they are completely dry. Do not soak your feet as this can dry your skin.  Apply a moisturizing lotion or petroleum jelly (that does not contain alcohol and is unscented) to the skin on your feet and to dry, brittle toenails. Do not apply lotion between your toes.  Trim your toenails straight across. Do not dig under them or around the cuticle. File the edges of your nails with an emery board or nail file.  Do not cut corns or calluses or try to remove them with medicine.  Wear clean socks or stockings every day. Make sure they are not too tight. Do not wear knee-high stockings since they may decrease blood flow to your legs.  Wear shoes that fit properly and have enough cushioning. To break in new shoes, wear them for just a few hours a day. This prevents you from injuring your feet. Always look in your shoes before you put them on to be sure there are no objects inside.  Do not cross your legs. This may decrease the blood flow to your feet.  If you find a minor scrape,  cut, or break in the skin on your feet, keep it and the skin around it clean and dry. These areas may be cleansed with mild soap and water. Do not cleanse the area with peroxide, alcohol, or iodine.  When you remove an adhesive bandage, be sure not to damage the skin around it.  If you have a wound, look at it several times a day to make sure it is healing.  Do not use heating pads or hot water bottles. They may burn your skin. If you have lost feeling in your feet or legs, you may not know it is happening until it is too late.  Make sure your health care provider performs a complete foot exam at least annually or more often if you have foot problems. Report any cuts, sores, or bruises to your health care provider immediately. SEEK MEDICAL CARE IF:   You have an injury that is not healing.  You have cuts or breaks in the skin.  You have an ingrown nail.  You notice redness on your legs or feet.  You feel burning or tingling in your legs or feet.  You have pain or cramps in your legs and feet.  Your legs or feet are numb.  Your feet always feel cold. SEEK IMMEDIATE MEDICAL CARE IF:   There is increasing redness,   swelling, or pain in or around a wound.  There is a red line that goes up your leg.  Pus is coming from a wound.  You develop a fever or as directed by your health care provider.  You notice a bad smell coming from an ulcer or wound. Document Released: 05/24/2000 Document Revised: 01/27/2013 Document Reviewed: 11/03/2012 Exit[employer] Patient Information 2015 [employer]. This information is not intended to replace advice given to you by your health care provider. Make sure you discuss any questions you have with your health care provider.  

## 2014-01-11 ENCOUNTER — Encounter: Payer: Self-pay | Admitting: Podiatry

## 2014-02-04 ENCOUNTER — Other Ambulatory Visit: Payer: Self-pay | Admitting: *Deleted

## 2014-02-04 MED ORDER — LISINOPRIL 20 MG PO TABS: 20.0000 mg | ORAL_TABLET | Freq: Every day | ORAL | Status: DC

## 2014-02-04 MED ORDER — GABAPENTIN 100 MG PO CAPS: 100.0000 mg | ORAL_CAPSULE | Freq: Three times a day (TID) | ORAL | Status: DC

## 2014-02-09 ENCOUNTER — Other Ambulatory Visit: Payer: Self-pay | Admitting: *Deleted

## 2014-02-09 MED ORDER — LISINOPRIL 20 MG PO TABS: 20.0000 mg | ORAL_TABLET | Freq: Every day | ORAL | Status: AC

## 2014-02-09 MED ORDER — GABAPENTIN 100 MG PO CAPS: 100.0000 mg | ORAL_CAPSULE | Freq: Three times a day (TID) | ORAL | Status: DC

## 2014-03-31 ENCOUNTER — Other Ambulatory Visit: Payer: Self-pay | Admitting: Family Medicine

## 2014-03-31 DIAGNOSIS — E0842 Diabetes mellitus due to underlying condition with diabetic polyneuropathy: Secondary | ICD-10-CM

## 2014-03-31 MED ORDER — GLUCOSE BLOOD VI STRP: 1.0000 | ORAL_STRIP | Freq: Every day | Status: DC

## 2014-04-04 ENCOUNTER — Other Ambulatory Visit: Payer: Self-pay | Admitting: Family Medicine

## 2014-04-04 DIAGNOSIS — E0842 Diabetes mellitus due to underlying condition with diabetic polyneuropathy: Secondary | ICD-10-CM

## 2014-04-04 MED ORDER — GLUCOSE BLOOD VI STRP: 1.0000 | ORAL_STRIP | Freq: Every day | Status: DC

## 2014-04-12 ENCOUNTER — Other Ambulatory Visit: Payer: Self-pay | Admitting: *Deleted

## 2014-04-12 MED ORDER — GLIMEPIRIDE 2 MG PO TABS: 1.0000 mg | ORAL_TABLET | Freq: Every day | ORAL | Status: DC

## 2014-05-06 ENCOUNTER — Telehealth: Payer: Self-pay | Admitting: Family Medicine

## 2014-05-06 NOTE — Telephone Encounter (Signed)
I spoke with wife and patient.   FALL Pt reports knees gave way in bathroom when trying to get up from the commode after voiding.  Patient fell onto onto his back back.  He denied hitting his head.  He denied injury from the fall.   EMS was called.   Pt reports that his  EMS mearurments of his BP and HR and CBG were normal  it was okay.   Pt took to bed all day because he was tired, but not feeling sick.   Patient having weakness in legs that has been getting progressively worse.  His wife confirms this progressive weakness.  She is no longer able to assist him with walking because of his poor strength and lack of endurance.   Pt and wife wants to have home health PT to start back working with patient as soon as possible.       Dizziness prior to fall:  no Syncope prior to fall:  no Vertigo:  no Chest Pain/Palpitations:  no  Associated acute Illness no Medications (new or dose changes):  no  Worry about falling:  yes   Vision Difficulty:  Yes, Myopia with presbyopia and astigmatism and left elelid ptosis, (+) glaucoma Independent in ADLs: no  Independent in I-ADLs: no  Hx of falls and falls evaluation: yes, impaired balancetreated with home PT Hx of orthostatic dizziness: no Hx of problems with legs (strength, pain):  yes, patient with lumbar stenosis and PAD  Hx of problems with feet: no Hx of urgency incontinence:  no Hx of problems with balance: yes Hx of problems with Gait: yes   Hx of Parkinsons Disease: no  Hx of Strokes: no Hx of Cardiac Disease:  no Hx of Cogntive Impairment: no  Hx problems with transfers:  no Hx of WC dependence:  no  Hx of Vitamin D insufficiency: yes, 27 (07/2008)   Patient and wife  fear falling or worries about falling he use a  walker    No medications make her lightheaded or tired She does not take medications to fall asleep  A/P  Fall - difficulty rising from commode set.  Wife letting pt use her 3-in-one commode which is  helping. - History of impaired balance - History of falls. - Wife does not think she will be able to get patient to car to bring to doctors for evaluation.  - [facility], Theresia Boughorma Wilson,  to see if there is transportation other than EMS to bring patient to a Same Day Walk-in appointment. - [facility] to have patient come in Monday morning or afternoon on 05/09/14 for Same day appointment to be seen by Dr Hannibal Skalla.

## 2014-05-06 NOTE — Telephone Encounter (Signed)
After hours line  Pt calling in stating that he fell yesterday and landed on his back. He thinks he may have hit his head as well but is unsure. He was checked out by EMTs and they recommended he call his PCP when th eclinic is open.   He has been discussing PT with his PCP and would like to get this set up ASAP.   He would like to Speak with his PCP ASAP.   He states that he is walking per his usual amount, from chair to toilet with assitance from furniture but is having a hard time thinking clearly. I recommended Emergency eval and he refuses stating that is not necessary. I will send a message to his PCP about his PT request.   Murtis SinkSam Bradshaw, MD [facility] Resident, PGY-3 05/06/2014, 11:38 AM

## 2014-05-09 NOTE — Telephone Encounter (Signed)
Spoke with wife and patient to inform them that their insurance doesn't cover medical transport.  They are aware of this and state that they don't want to come out in the rain anyway today.  Wife is also sick today and doesn't feel safe bringing him from the house.  Will forward to MD to see if they are a candidate for home visits. Javad Salva,CMA

## 2014-05-09 NOTE — Telephone Encounter (Signed)
Spoke with Dr. Perley JainMcdiarmid and he will go out to patient's home tomorrow at 2pm.  Pt and wife are aware of this. Lamae Fosco,CMA

## 2014-05-10 ENCOUNTER — Non-Acute Institutional Stay: Payer: Medicare Other | Admitting: Family Medicine

## 2014-05-10 ENCOUNTER — Encounter: Payer: Self-pay | Admitting: Family Medicine

## 2014-05-10 ENCOUNTER — Other Ambulatory Visit: Payer: Self-pay | Admitting: Family Medicine

## 2014-05-10 DIAGNOSIS — L97521 Non-pressure chronic ulcer of other part of left foot limited to breakdown of skin: Secondary | ICD-10-CM | POA: Diagnosis not present

## 2014-05-10 DIAGNOSIS — Z789 Other specified health status: Secondary | ICD-10-CM

## 2014-05-10 DIAGNOSIS — R2689 Other abnormalities of gait and mobility: Secondary | ICD-10-CM | POA: Diagnosis not present

## 2014-05-10 DIAGNOSIS — R296 Repeated falls: Secondary | ICD-10-CM

## 2014-05-10 DIAGNOSIS — I70219 Atherosclerosis of native arteries of extremities with intermittent claudication, unspecified extremity: Secondary | ICD-10-CM

## 2014-05-10 DIAGNOSIS — M4806 Spinal stenosis, lumbar region: Secondary | ICD-10-CM

## 2014-05-10 DIAGNOSIS — N3941 Urge incontinence: Secondary | ICD-10-CM

## 2014-05-10 DIAGNOSIS — E0842 Diabetes mellitus due to underlying condition with diabetic polyneuropathy: Secondary | ICD-10-CM

## 2014-05-10 DIAGNOSIS — G5600 Carpal tunnel syndrome, unspecified upper limb: Secondary | ICD-10-CM | POA: Insufficient documentation

## 2014-05-10 DIAGNOSIS — Z7409 Other reduced mobility: Secondary | ICD-10-CM | POA: Insufficient documentation

## 2014-05-10 DIAGNOSIS — Z636 Dependent relative needing care at home: Secondary | ICD-10-CM

## 2014-05-10 DIAGNOSIS — I10 Essential (primary) hypertension: Secondary | ICD-10-CM

## 2014-05-10 DIAGNOSIS — M48061 Spinal stenosis, lumbar region without neurogenic claudication: Secondary | ICD-10-CM | POA: Insufficient documentation

## 2014-05-10 HISTORY — DX: Other reduced mobility: Z74.09

## 2014-05-10 HISTORY — DX: Other abnormalities of gait and mobility: R26.89

## 2014-05-10 HISTORY — DX: Urge incontinence: N39.41

## 2014-05-10 HISTORY — DX: Other specified health status: Z78.9

## 2014-05-10 NOTE — Assessment & Plan Note (Signed)
Adequate BP control. Recommend patient stop Clonidine completely. Will have HH nurse monitor vitals off clonidine and notify us if > 140/90.

## 2014-05-10 NOTE — Assessment & Plan Note (Signed)
Ask Home Health Nurse to draw Lipid panel. Will need to see if patient would allow start of a statin.

## 2014-05-10 NOTE — Progress Notes (Unsigned)
This encounter was created in error - please disregard.

## 2014-05-10 NOTE — Assessment & Plan Note (Signed)
Chronic Stable.  Encouraged use of kerotolytic cream to heel daily.

## 2014-05-10 NOTE — Assessment & Plan Note (Signed)
Suspect secondary to cervical surgery in 1993.  Pt with fair to good preserved strength in hands.   Monitor for now.  Will ask Home OT to see patient to assist with ADL training and assist devices.

## 2014-05-10 NOTE — Assessment & Plan Note (Signed)
Requesting home health nurse to draw A1C. If patient continues to have good glycemic control, will stop amaryl and monitor.

## 2014-05-10 NOTE — Assessment & Plan Note (Addendum)
Chronic condition with recent worsening.  Multiple predisposing conditions for falls including: visual impairment, peripheral neuropathy, impaired balance, poor leg strength, deconditioning, home hazards (cluttered apartment), left foot plantar flexion weakness, use of Acetaminophen with Benadryl, fear of falling, urge incontinence, and use of Clonidine. Patient's wife is primary caretaker.  She is becoming more stressed as his needs increase and her physical abilities decline.   Plan: Home Health Physical therapy and Occupational therapyconsults for evaluation and treatment of balance and strength impairments. Home Safety evaluation by Home Health Stop medications that contain benadryl Recommend patient have dilated ophthalmologic exam soon.  Home Health nurse to check vitals, draw labs,  Home Health Social Worker consultation to help wife access hands on assistance for patient in home and in community, access community transportation

## 2014-05-10 NOTE — Assessment & Plan Note (Signed)
I suspect patient's severe Lumbar spinal stenosis is a significant contributor to his difficulty with balance and bilateral leg weakness as evidence by his tremulousness with standing for even brief periods of time.  The stenosis does not appear to be causing significant pain. No cauda equina symptoms.  Patient is not interested in consulting with neurosurgery to see if they would be able to offer him help thru surgical intervention. Will monitor and treat pain should it arise. Pt has benefited from epidural injections in 2010 but that was for back and leg pain from the stenosis.

## 2014-05-10 NOTE — Progress Notes (Signed)
Patient ID: Carlos Andrews, male   DOB: [birth year], 80 y.o.   MRN: [id number] [place] Visit by Lissa Morales, MD Patient is accompanied by: wife Primary caregiver: wife History obtained from: Patient, wife and EHR Primary Care Provider: Lissa Morales, MD History Chief Complaint  Patient presents with  . Fall    HPI by problems:   Patient reports at least 2 months of weakness and tremulousness in both legs with worsening in last several weeks.   He estimates that he has fallen 4 times in last 6 months.   All falls occurred in his apartment.  He describes the falls as "losing my balance". He falls backwards primarily.  He is uses his walker to ambulate in the apartment or he balances and leans on furniture in the apartment to ambulate.   He denies loss of consciousness preceding falls.  He denies vertigo with falls. He denies loss of feeling in feet.  He denies pain in back or legs.   Most recent fall,  Pt reports knees gave way in bathroom when trying to get up from the elevated seat commode after voiding. Patient fell onto onto his back. He denied hitting his head. He denied injury from the fall. EMS was called. Pt reports that his EMS mearurments of his BP and HR and CBG were normal it was okay.   Patient having weakness in legs that has been getting progressively worse.  His wife confirms this progressive weakness. She is no longer able to assist him with walking because of his poor strength, his lack of endurance and her own limitations from osteoarthritis.   Pt and wife wants to have home health PT to start back working with patient as soon as possible.   Dizziness prior to fall: no Syncope prior to fall: no Vertigo: no Chest Pain/Palpitations: no  Associated acute Illness no Medications (new or dose changes): no. Patient ascribes the difficulty with his legs started after he began Gabapentin in 2012. He began gabapentin for "pins and  needles" sensation in [place].   He is currently taking gabapentin 100 mg at bedtime.   Worry about falling: yes   Vision Difficulty: Yes, Myopia with presbyopia and astigmatism and right  eyelid ptosis, (+) at risk glaucoma. Last ophthamologic dilated exam about 2 years ago per patient.    Hx of falls and falls evaluation: yes, impaired balance treated with home PT in 2012.  Hx of orthostatic dizziness: no Hx of problems with legs (strength, pain): yes, patient with lumbar stenosis and PAD  Hx of problems with feet: History of peripheral neuropathy and bunions and right. heel calluses. History of Peripheral artery disease- severe tibial artery disease.  Hx of urgency incontinence: no Hx of problems with balance: yes Hx of problems with Gait: yes  Hx of Parkinsons Disease: no  Hx of Strokes: no  History of spinal surgery: 1) s/p C1-2, C2-3 anterior cervical diskectomies, partial corpectomies through thoracotomy at [facility]. 2) s/p T1-T2 spinal cord compression secondary to granualtion tissue and abscess material secondary to previous mediastinitis at [facility]. 3) s/p T3-T4 osteomyelitis with multiple open drainages including thoracotomy at [facility].  4) Epidural corticosteroid injection for L2-3 & L4-5 severe spinal stenosis at [facility] on 9/10, 10/10, 11/10    Hx of Cardiac Disease: no Hx of Cogntive Impairment: no  Hx problems with transfers: no Hx of WC dependence: no  Hx of Vitamin D insufficiency: yes, 27 (07/2008)   Patient  and wife fear falling or worries about falling he use a walker    Patient taking Acetaminophen with diphenylhydramine at bedtime      Outpatient Encounter Prescriptions as of 05/10/2014  Medication Sig  . Ascorbic Acid (VITAMIN C) 500 MG CHEW Chew by mouth daily.    Marland Kitchen aspirin 81 MG tablet Take 81 mg by mouth daily.    . Blood Glucose Monitoring Suppl (ONE TOUCH ULTRA SYSTEM KIT) W/DEVICE KIT 1 kit by Does not apply route once.   . calcium-vitamin D (OSCAL WITH D 250-125) 250-125 MG-UNIT per tablet Take 1 tablet by mouth daily.    . Fish Oil OIL Take 1,000 mg by mouth daily.   Marland Kitchen gabapentin (NEURONTIN) 100 MG capsule Take 1 capsule (100 mg total) by mouth 3 (three) times daily. (Patient taking differently: Take 100 mg by mouth daily. )  . glimepiride (AMARYL) 2 MG tablet Take 0.5 tablets (1 mg total) by mouth at bedtime.  Marland Kitchen glucose blood (RELION CONFIRM/MICRO TEST) test strip 1 each by Other route daily. ICD-10: E08.42.  Testing once a day  . lisinopril (PRINIVIL,ZESTRIL) 20 MG tablet Take 1 tablet (20 mg total) by mouth at bedtime.  . Multiple Vitamin (MULTIVITAMIN) tablet Take 1 tablet by mouth daily.    . Elastic Bandages & Supports (V-2 HIGH COMPRESSION HOSE) MISC Put on in morning, remove at bedtime.   . Pyridoxine HCl (VITAMIN B-6) 100 MG tablet 3 tabs once daily.   . ReliOn Alcohol Swabs PADS Check blood sugar twice daily.   . ReliOn Ultra Thin Lancets MISC 1 each by Does not apply route 2 (two) times daily before a meal.  . vitamin B-12 (CYANOCOBALAMIN) 1000 MCG tablet Take 1,000 mcg by mouth daily.    . vitamin E (VITAMIN E) 400 UNIT capsule Take 400 Units by mouth daily.    . [DISCONTINUED] capsaicin (ZOSTRIX) 0.025 % cream Apply topically 3 (three) times daily. (Patient not taking: Reported on 05/10/2014)  . [DISCONTINUED] capsicum (ZOSTRIX) 0.075 % topical cream Apply 1 application topically 2 (two) times daily. (Patient not taking: Reported on 05/10/2014)  . [DISCONTINUED] cloNIDine (CATAPRES) 0.2 MG tablet Take 0.5 tablets (0.1 mg total) by mouth at bedtime.  . [DISCONTINUED] naproxen sodium (ANAPROX) 220 MG tablet Take 220 mg by mouth 2 (two) times daily with a meal.  . [DISCONTINUED] Potassium Gluconate 595 MG TABS Take by mouth. 1/2 pill once daily     History Patient Active Problem List   Diagnosis Date Noted  . Spinal stenosis of lumbar region 05/10/2014  . Impairment of balance 05/10/2014  . Thenar  atrophy, bilateral, right greater than left.  05/10/2014  . At risk for falling 10/04/2013  . Recurrent falls 10/04/2013  . Skin ulcer of foot, left heel 02/23/2013  . Myopia with presbyopia of left eye 02/23/2013  . Open angle with borderline findings and low glaucoma risk in both eyes 02/23/2013  . Regular astigmatism 02/23/2013  . Presbyopia 02/23/2013  . Ptosis of right eyelid 02/23/2013  . Peripheral vascular disease, unspecified 04/21/2012  . Atherosclerosis of native arteries of the extremities with intermittent claudication 04/21/2012  . GAIT DISTURBANCE 08/01/2008  . LUMBAR SPRAIN AND STRAIN 07/19/2008  . KIDNEY DISEASE, CHRONIC, STAGE III 03/13/2007  . Diabetes mellitus due to underlying condition with diabetic polyneuropathy 08/07/2006  . HYPERLIPIDEMIA 08/07/2006  . OBESITY, NOS 08/07/2006  . Unspecified hereditary and idiopathic peripheral neuropathy 08/07/2006  . HYPERTENSION, BENIGN SYSTEMIC 08/07/2006  . 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 OSTEOARTHRITIS, LOWER  LEG 08/07/2006  . Proteinuria 08/07/2006   Past Medical History  Diagnosis Date  . DIABETES MELLITUS, II, COMPLICATIONS 3/79/0240  . Esophageal perforation 1993    managed at [facility]  . PPD positive 1993    Found at [facility]  . Spinal stenosis of lumbar region at multiple levels   . Proteinuria 08/07/2006    Qualifier: Diagnosis of  By: Drucie Ip    . Open angle with borderline findings and low glaucoma risk in both eyes 02/23/2013  . Regular astigmatism 02/23/2013  . Presbyopia 02/23/2013  . Ptosis of left eyelid 02/23/2013  . At risk for falling 10/04/2013  . GAIT DISTURBANCE 08/01/2008        . HYPERLIPIDEMIA 08/07/2006    Qualifier: Diagnosis of  By: Drucie Ip    . HYPERTENSION, BENIGN SYSTEMIC 08/07/2006    Qualifier: Diagnosis of  By: Drucie Ip    . 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 LUMBAR SPRAIN AND STRAIN 07/19/2008    Qualifier: Diagnosis of   By: Lindell Noe MD, Jeneen Rinks    . Myopia with presbyopia of left eye 02/23/2013  . OBESITY, NOS 08/07/2006    Qualifier: Diagnosis of  By: Drucie Ip    . OSTEOARTHRITIS, LOWER LEG 08/07/2006    Qualifier: Diagnosis of  By: Drucie Ip    . PERIPHERAL EDEMA 08/01/2008    Qualifier: Diagnosis of  By: Walker Kehr MD, Patrick Jupiter    . Peripheral vascular disease, unspecified 04/21/2012    Severe tibial occlusive disease (Dr Kellie Simmering, [facility], 04/2002)     . Recurrent falls 10/04/2013  . Skin ulcer of foot, left heel 02/23/2013  . Type II or unspecified type diabetes mellitus without mention of complication, not stated as uncontrolled 08/07/2006    Qualifier: Diagnosis of  By: Drucie Ip    . Unspecified hereditary and idiopathic peripheral neuropathy 08/07/2006        . Proteinuria 08/07/2006   Past Surgical History  Procedure Laterality Date  . Neck surgery  1993    s/p C1-2, C2-3 anterior cervical diskectomies, partial corpectomies through thoracotomy at [place]  . Cataract extraction      Cataract surgery   . Thoracotomy  1993    s/p T3-T4 osteomyelitis with multiple open drainages including thoracotomy at [place]  . Spinal cord decompression  1993    s/p T1-T2 spinal cord compression secondary to granualtion tissue and abscess material secondary to previous mediastinitis at [facility]  . Jejunostomy feeding tube  1993    s/p Feeding jejunostomy [facility]  . Esophageal perforation  1993    Hx of esophageal perforation complication 9735  . Lumbar epidural injection  2010    Epidural corticosteroid injection for L2-3 & L4-5 severe spinal stenosis at [place] on 9/10, 10/10, 11/10     Family History  Problem Relation Age of Onset  . Diabetes Mother   . Diabetes Father   . Hypertension Father   . Other Father     amputaion  . Diabetes Son   . Hypertension Son     reports that he quit smoking about 27 years ago. His smoking use included Cigarettes. He smoked 0.00 packs per day.  He has never used smokeless tobacco. He reports that he does not drink alcohol or use illicit drugs.  Basic Activities of Daily Living  Dependence or assistance needed for  ambulating, grooming, dressing upper body, dressing lower body and meal preparation  Instrumental Activities of Daily Living Dependence or assistance needed for shopping, household maintenance, and transportation  Caregiver Burdens: Patient's wife is experiencing considerable stress in providing care and supervision to her husband.  She has health problems that make providing care considerably taxing.     Health Maintenance reviewed: Immunization History  Administered Date(s) Administered  . Influenza Whole 03/12/2007, 03/04/2008  . Pneumococcal Conjugate-13 10/04/2013  . Pneumococcal Polysaccharide-23 05/11/1999  . Td 06/11/1991, 12/09/2003, 12/19/2003   Health Maintenance Topics with due status: Overdue     Topic Date Due   ZOSTAVAX 05/13/1995   OPHTHALMOLOGY EXAM 02/16/2013   FOOT EXAM 02/19/2013   TETANUS/TDAP 12/18/2013   INFLUENZA VACCINE 01/08/2014   HEMOGLOBIN A1C 04/05/2014    Diet: Regular- Patient is [employer].  Vegetarian diet.  Nutritional supplements: Patient takes multiple vitamin supplements.   ROS Denies changes in appetite; denies changes in weight;  Denies changes in vision / hearing Denies head cold symptoms Denies chest congestion  Or shortness of breath at rest or with exertion.  Denies chest pain; denies heart beating slower/thumps inside chest; denies racing heart/flutter;  Denies dysuria; (+) Urge incontinence (chronic problem) Denies constipation; denies diarrhea;  Denies abdominal discomfort; denies heart burn;  Denies (+)recent falls/unsteady gait;  Denies unilateral weakness / hand clumsiness / tingling / numbness; denies tremors;  Denies sadness / anxiety / (+) fear of falling  Vital Signs   There is no weight on file to calculate BMI. CrCl cannot be  calculated (Unknown ideal weight.). There is no height or weight on file to calculate BSA. Filed Vitals:   05/10/14 1458  BP: 120/80  Pulse: 80  Resp: 16   Wt Readings from Last 3 Encounters:  01/10/14 172 lb (78.019 kg)  10/04/13 187 lb (84.823 kg)  02/22/13 173 lb (78.472 kg)    Physical Examination:  VS reviewed GEN: Alert, Cooperative, Groomed, NAD, sitting in chair.  HEENT: PERRL; EAC bilaterally not occluded,                 COR: RRR with PB every 5th beat, No M/G/R, No JVD, Normal PMI size and location LUNGS: BCTA, No Acc mm use, speaking in full sentences ABDOMEN: (+)BS, soft, NT, ND, No HSM, No palpable masses EXT: No peripheral leg edema. Feet with bilateral bunions and hammer toes. trace bilateral pedal pulses. Skin thickening on left post heel  No skin breaks. Intact to monofilament touch bilaterally.  Neuro:  Neurological Examination:  Cranial Nerves:  II - Visual fields full to finger confrontation at bedside. Pupils 2-3 mm bilateral sluggish reaction to light direct  VII - Face symmetric.  VIII - Hearing intact bilaterally.  IX, X and XII - Tongue, uvula and palate mid position.  XI - Shoulder shrug is symmetric.  Motor Examination: Normal tone.  Thenar wasting right hand and left hand with R>L.   Right Upper Extremity: 4+/5 grip, 5/5 Elbow Flex/Ext, 5/5 shoulder abdomen / add Left Upper Extremity: 5/5 grip, 5/5 Elbow Flex/Ext, 5/5 shoulder abdomen / add Right Lower Extremity: 5/5 ankle PF / DF, 4/5 knee flex. 5/5 knee extension, 5/5 hip flexion  Left Lower Extremity: 4/5 ankle PF, 5/5 ankle DF,4/5 knee flex, 5/5 knee extension, 5/5 hip flexion .  Coordination: Finger-to-nose movements good on the right, good on the left.  Patient had no difficulty with rapid alternating movements on the left.  Patient had no difficulty with rapid alternating movements on the right Rhomberg:unable to perform standing because unable to achieve erect  posture No pronator  drift Reflexes:  Right Biceps: 1+ Left Biceps: 1+  Right Patellar: 1+ Left Patellar: 0 Right Achilles: 0+ Left Achilles: 0+   Sensory Examination: Intact to light touch, temperature, grt toe proprioception;  (-) vibration at great toes bilaterally.  Gait/Station: Patient unable to rise from straight back armless chair.  Patient transfers between chairs in almost 90 degree flexion of torso and holding onto surrounding furniture.  Pt was unable to attempt to stand erect with feet 6 inches apart.   Oriented to person, place, and time; Strength: 5/5 Bil. UE and LE symmetric; Sensation: Intact grossly to touch all four extremities; Cerebellar: Finger-to-Nose intact, Rhomberg negative; Muscle Tone normal; Tremor not present  Timed Up & Go Test: unable to perform Sit-to-Stand Test: unable to perform 4-Stage Stand Test:  Unable to perform Psych: Normal affect/thought/speech/language   Mini-Mental State Examination: Total Score: MMSE - Mini Mental State Exam 05/10/2014  Orientation to time 5  Orientation to Place 5  Registration 3  Attention/ Calculation 5  Recall 2  Language- name 2 objects 2  Language- repeat 1  Language- follow 3 step command 3  Language- read & follow direction 1  Write a sentence 1  Copy design 0  Total score 28       PHQ-2: 1 point (recent death of his brother).    Labs  Lab Results  Component Value Date   VITAMINB12 1164* 04/02/2010    Lab Results  Component Value Date   FOLATE >20.0 ng/mL 08/01/2008    Lab Results  Component Value Date   TSH 0.426 05/14/2010    No results found for: RPR    Chemistry      Component Value Date/Time   NA 141 02/22/2013 1440   K 4.3 02/22/2013 1440   CL 105 02/22/2013 1440   CO2 28 02/22/2013 1440   BUN 20 02/22/2013 1440   CREATININE 1.22 02/22/2013 1440   CREATININE 1.28 04/02/2010 1750      Component Value Date/Time   CALCIUM 9.4 02/22/2013 1440   ALKPHOS 59 03/28/2011 1133   AST 21 03/28/2011 1133    ALT 17 03/28/2011 1133   BILITOT 0.8 03/28/2011 1133       Lab Results  Component Value Date   HGBA1C 5.4 10/04/2013      Lab Results  Component Value Date   WBC 5.3 08/01/2008   HGB 14.6 08/01/2008   HCT 43.0 08/01/2008   MCV 81.3 08/01/2008   PLT 147* 08/01/2008    No results found for this or any previous visit (from the past 24 hour(s)).  Assessment and Plan: Problem List Items Addressed This Visit    None     Home visit was for 45 minutes with over half the time in counseling and coordination of care.   Advanced Directives: Code Status:  Not discussed   [id number] (home)    Patient to Follow up with Dr. 2 to 3 months with Dr Maximina Pirozzi

## 2014-05-11 ENCOUNTER — Telehealth: Payer: Self-pay | Admitting: Clinical

## 2014-05-11 NOTE — Telephone Encounter (Signed)
CSW confirmed with Carlos [facility] that the pt has been scheduled for a visit tomorrow, 05/12/14.  Carlos Andrews, MSW, LCSW [phone]

## 2014-05-18 ENCOUNTER — Telehealth: Payer: Self-pay | Admitting: Family Medicine

## 2014-05-18 NOTE — Telephone Encounter (Signed)
Don with OT will be faxing orders for extended therapy.  He also wants to get order for upper body exercises.  Will forward to MD to make him aware that these will be sent. Casimiro Lienhard,CMA

## 2014-05-18 NOTE — Telephone Encounter (Signed)
Rhunette Crofton Johnson called back to speak to Jazmin about the patient her wanted to add home exercise to the plan of care. Please call him back at [phone]. jw

## 2014-08-05 ENCOUNTER — Encounter (HOSPITAL_COMMUNITY): Payer: Self-pay | Admitting: Emergency Medicine

## 2014-08-05 ENCOUNTER — Emergency Department (HOSPITAL_COMMUNITY)
Admission: EM | Admit: 2014-08-05 | Discharge: 2014-08-05 | Disposition: A | Discharge status: Home / Self Care | Payer: Medicare Other | Attending: Emergency Medicine | Admitting: Emergency Medicine

## 2014-08-05 DIAGNOSIS — Y998 Other external cause status: Secondary | ICD-10-CM | POA: Diagnosis not present

## 2014-08-05 DIAGNOSIS — I10 Essential (primary) hypertension: Secondary | ICD-10-CM | POA: Insufficient documentation

## 2014-08-05 DIAGNOSIS — E669 Obesity, unspecified: Secondary | ICD-10-CM | POA: Insufficient documentation

## 2014-08-05 DIAGNOSIS — Z8669 Personal history of other diseases of the nervous system and sense organs: Secondary | ICD-10-CM | POA: Diagnosis not present

## 2014-08-05 DIAGNOSIS — Z79899 Other long term (current) drug therapy: Secondary | ICD-10-CM | POA: Insufficient documentation

## 2014-08-05 DIAGNOSIS — M179 Osteoarthritis of knee, unspecified: Secondary | ICD-10-CM | POA: Insufficient documentation

## 2014-08-05 DIAGNOSIS — Y9389 Activity, other specified: Secondary | ICD-10-CM | POA: Insufficient documentation

## 2014-08-05 DIAGNOSIS — Z043 Encounter for examination and observation following other accident: Secondary | ICD-10-CM | POA: Insufficient documentation

## 2014-08-05 DIAGNOSIS — Y9289 Other specified places as the place of occurrence of the external cause: Secondary | ICD-10-CM | POA: Diagnosis not present

## 2014-08-05 DIAGNOSIS — Z87891 Personal history of nicotine dependence: Secondary | ICD-10-CM | POA: Diagnosis not present

## 2014-08-05 DIAGNOSIS — N289 Disorder of kidney and ureter, unspecified: Secondary | ICD-10-CM | POA: Insufficient documentation

## 2014-08-05 DIAGNOSIS — W06XXXA Fall from bed, initial encounter: Secondary | ICD-10-CM | POA: Insufficient documentation

## 2014-08-05 DIAGNOSIS — Z8719 Personal history of other diseases of the digestive system: Secondary | ICD-10-CM | POA: Insufficient documentation

## 2014-08-05 DIAGNOSIS — Z7982 Long term (current) use of aspirin: Secondary | ICD-10-CM | POA: Diagnosis not present

## 2014-08-05 DIAGNOSIS — Z872 Personal history of diseases of the skin and subcutaneous tissue: Secondary | ICD-10-CM | POA: Insufficient documentation

## 2014-08-05 DIAGNOSIS — W19XXXA Unspecified fall, initial encounter: Secondary | ICD-10-CM

## 2014-08-05 DIAGNOSIS — E118 Type 2 diabetes mellitus with unspecified complications: Secondary | ICD-10-CM | POA: Diagnosis not present

## 2014-08-05 LAB — URINE MICROSCOPIC-ADD ON

## 2014-08-05 LAB — URINALYSIS, ROUTINE W REFLEX MICROSCOPIC
Hgb urine dipstick: NEGATIVE
Ketones, ur: 15 mg/dL — AB
NITRITE: NEGATIVE
PH: 7 (ref 5.0–8.0)
Protein, ur: 30 mg/dL — AB
Specific Gravity, Urine: 1.02 (ref 1.005–1.030)
UROBILINOGEN UA: 1 mg/dL (ref 0.0–1.0)

## 2014-08-05 LAB — I-STAT CHEM 8, ED
BUN: 27 mg/dL — ABNORMAL HIGH (ref 6–23)
CALCIUM ION: 1.17 mmol/L (ref 1.13–1.30)
CHLORIDE: 103 mmol/L (ref 96–112)
Creatinine, Ser: 1.6 mg/dL — ABNORMAL HIGH (ref 0.50–1.35)
Glucose, Bld: 239 mg/dL — ABNORMAL HIGH (ref 70–99)
HEMATOCRIT: 46 % (ref 39.0–52.0)
Hemoglobin: 15.6 g/dL (ref 13.0–17.0)
Potassium: 4.1 mmol/L (ref 3.5–5.1)
SODIUM: 139 mmol/L (ref 135–145)
TCO2: 21 mmol/L (ref 0–100)

## 2014-08-05 NOTE — Progress Notes (Signed)
No reply from pt, wife nor ED RN about choice of home health agency.   Cm sent EPIC in basket note to pcp about visit/home health orders

## 2014-08-05 NOTE — ED Provider Notes (Signed)
CSN: [id number]     Arrival date & time 08/05/14  1043 History   First MD Initiated Contact with Patient 08/05/14 1054     Chief Complaint  Patient presents with  . Fall    Pt seen with medical student, I performed history/physical/documentation  Patient is a 81 y.o. male presenting with fall. The history is provided by the patient.  Fall This is a new problem. The problem has been gradually improving. Pertinent negatives include no chest pain, no abdominal pain, no headaches and no shortness of breath. Exacerbated by: standing. The symptoms are relieved by rest. He has tried nothing for the symptoms.  Patient reports to ER for 2 falls in past 24 hours He reports he "rolled off" of bed last night No LOC.  No injury reported but required EMS to help him up in bed He refused transport This morning he reports he slipped while attempting to get out of bed No weakness/dizziness reported No cp/sob No HA No LOC at that time  He reports he feels well at this time No new medications have been added    Past Medical History  Diagnosis Date  . DIABETES MELLITUS, II, COMPLICATIONS 07/07/7865  . Esophageal perforation 1993    managed at [facility]  . PPD positive 1993    Found at [facility]  . Spinal stenosis of lumbar region at multiple levels   . Proteinuria 08/07/2006    Qualifier: Diagnosis of  By: Drucie Ip    . Open angle with borderline findings and low glaucoma risk in both eyes 02/23/2013  . Regular astigmatism 02/23/2013  . Presbyopia 02/23/2013  . Ptosis of left eyelid 02/23/2013  . At risk for falling 10/04/2013  . GAIT DISTURBANCE 08/01/2008        . HYPERLIPIDEMIA 08/07/2006    Qualifier: Diagnosis of  By: Drucie Ip    . HYPERTENSION, BENIGN SYSTEMIC 08/07/2006    Qualifier: Diagnosis of  By: Drucie Ip    . 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 LUMBAR SPRAIN AND STRAIN 07/19/2008    Qualifier: Diagnosis of  By: Lindell Noe MD, Jeneen Rinks     . Myopia with presbyopia of left eye 02/23/2013  . OBESITY, NOS 08/07/2006    Qualifier: Diagnosis of  By: Drucie Ip    . OSTEOARTHRITIS, LOWER LEG 08/07/2006    Qualifier: Diagnosis of  By: Drucie Ip    . PERIPHERAL EDEMA 08/01/2008    Qualifier: Diagnosis of  By: Walker Kehr MD, Patrick Jupiter    . Peripheral vascular disease, unspecified 04/21/2012    Severe tibial occlusive disease (Dr Kellie Simmering, [facility], 04/2002)     . Recurrent falls 10/04/2013  . Skin ulcer of foot, left heel 02/23/2013  . Type II or unspecified type diabetes mellitus without mention of complication, not stated as uncontrolled 08/07/2006    Qualifier: Diagnosis of  By: Drucie Ip    . Unspecified hereditary and idiopathic peripheral neuropathy 08/07/2006        . Proteinuria 08/07/2006  . Impairment of balance 05/10/2014  . Urge incontinence 05/10/2014  . Impaired mobility and ADLs 05/10/2014   Past Surgical History  Procedure Laterality Date  . Neck surgery  1993    s/p C1-2, C2-3 anterior cervical diskectomies, partial corpectomies through thoracotomy at [place]  . Cataract extraction      Cataract surgery   . Thoracotomy  1993    s/p T3-T4 osteomyelitis with multiple open drainages including thoracotomy  at [place]  . Spinal cord decompression  1993    s/p T1-T2 spinal cord compression secondary to granualtion tissue and abscess material secondary to previous mediastinitis at [place]  . Jejunostomy feeding tube  1993    s/p Feeding jejunostomy [place]  . Esophageal perforation  1993    Hx of esophageal perforation complication 1324  . Lumbar epidural injection  2010    Epidural corticosteroid injection for L2-3 & L4-5 severe spinal stenosis at [place] on 9/10, 10/10, 11/10     Family History  Problem Relation Age of Onset  . Diabetes Mother   . Diabetes Father   . Hypertension Father   . Other Father     amputaion  . Diabetes Son   . Hypertension Son    History  Substance Use  Topics  . Smoking status: Former Smoker    Types: Cigarettes    Quit date: 04/22/1987  . Smokeless tobacco: Never Used  . Alcohol Use: No    Review of Systems  Constitutional: Negative for fever and fatigue.  Eyes: Negative for visual disturbance.  Respiratory: Negative for shortness of breath.   Cardiovascular: Negative for chest pain.  Gastrointestinal: Negative for vomiting, abdominal pain and diarrhea.  Musculoskeletal: Negative for back pain and neck pain.  Neurological: Negative for dizziness, weakness and headaches.  All other systems reviewed and are negative.     Allergies  Naprosyn; Rofecoxib; and Tramadol hcl  Home Medications   Prior to Admission medications   Medication Sig Start Date End Date Taking? Authorizing Provider  Ascorbic Acid (VITAMIN C) 500 MG CHEW Chew by mouth daily.      Historical Provider, MD  aspirin 81 MG tablet Take 81 mg by mouth daily.      Historical Provider, MD  Blood Glucose Monitoring Suppl (ONE TOUCH ULTRA SYSTEM KIT) W/DEVICE KIT 1 kit by Does not apply route once. 07/02/12   Blane Ohara McDiarmid, MD  calcium-vitamin D (OSCAL WITH D 250-125) 250-125 MG-UNIT per tablet Take 1 tablet by mouth daily.      Historical Provider, MD  Elastic Bandages & Supports (V-2 HIGH COMPRESSION HOSE) MISC Put on in morning, remove at bedtime.     Historical Provider, MD  Fish Oil OIL Take 1,000 mg by mouth daily.     Historical Provider, MD  glimepiride (AMARYL) 2 MG tablet Take 0.5 tablets (1 mg total) by mouth at bedtime. 04/12/14   Blane Ohara McDiarmid, MD  glucose blood (RELION CONFIRM/MICRO TEST) test strip 1 each by Other route daily. ICD-10: E08.42.  Testing once a day 04/04/14   Blane Ohara McDiarmid, MD  lisinopril (PRINIVIL,ZESTRIL) 20 MG tablet Take 1 tablet (20 mg total) by mouth at bedtime. 02/09/14   Blane Ohara McDiarmid, MD  Multiple Vitamin (MULTIVITAMIN) tablet Take 1 tablet by mouth daily.      Historical Provider, MD  Pyridoxine HCl (VITAMIN B-6) 100 MG  tablet 3 tabs once daily.     Historical Provider, MD  ReliOn Alcohol Swabs PADS Check blood sugar twice daily.     Historical Provider, MD  ReliOn Ultra Thin Lancets MISC 1 each by Does not apply route 2 (two) times daily before a meal. 07/13/12   Blane Ohara McDiarmid, MD  vitamin B-12 (CYANOCOBALAMIN) 1000 MCG tablet Take 1,000 mcg by mouth daily.      Historical Provider, MD  vitamin E (VITAMIN E) 400 UNIT capsule Take 400 Units by mouth daily.      Historical Provider,  MD   BP 116/64 mmHg  Pulse 98  Temp(Src) 98.3 F (36.8 C) (Oral)  Resp 19  SpO2 98% Physical Exam CONSTITUTIONAL: elderly, frail, no distress HEAD: Normocephalic/atraumatic EYES: EOMI/PERRL. Right ptosis noted (chronic per patient) ENMT: Mucous membranes moist NECK: supple no meningeal signs SPINE/BACK:entire spine nontender CV: S1/S2 noted, no murmurs/rubs/gallops noted LUNGS: Lungs are clear to auscultation bilaterally, no apparent distress ABDOMEN: soft, nontender, no rebound or guarding, bowel sounds noted throughout abdomen GU:no cva tenderness NEURO: Pt is awake/alert/appropriate, moves all extremitiesx4.  No facial droop. No arm/leg drift   EXTREMITIES: pulses normal/equal, full ROM, All other extremities/joints palpated/ranged and nontender SKIN: warm, color normal PSYCH: no abnormalities of mood noted, alert and oriented to situation  ED Course  Procedures   12:27 PM Pt reports mechanical fall x2 Denies syncope He is well appearing Labs pending Request for home health placed Pt is requesting d/c home  Renal insufficiency noted Pt requesting d/c home He is ambulatory   Labs Review Labs Reviewed  URINALYSIS, ROUTINE W REFLEX MICROSCOPIC - Abnormal; Notable for the following:    Color, Urine AMBER (*)    Glucose, UA >1000 (*)    Bilirubin Urine MODERATE (*)    Ketones, ur 15 (*)    Protein, ur 30 (*)    Leukocytes, UA TRACE (*)    All other components within normal limits  I-STAT CHEM 8, ED -  Abnormal; Notable for the following:    BUN 27 (*)    Creatinine, Ser 1.60 (*)    Glucose, Bld 239 (*)    All other components within normal limits  URINE MICROSCOPIC-ADD ON     EKG Interpretation   Date/Time:  Friday August 05 2014 10:44:34 EST Ventricular Rate:  99 PR Interval:  180 QRS Duration: 110 QT Interval:  352 QTC Calculation: 452 R Axis:   -25 Text Interpretation:  Sinus tachycardia Atrial premature complexes  Probable left atrial enlargement Incomplete left bundle branch block  Consider anterior infarct No previous ECGs available Abnormal ekg  Confirmed by Christy Gentles  MD, Elenore Rota ([id number]) on 08/05/2014 12:04:52 PM      MDM   Final diagnoses:  Fall, initial encounter  Renal insufficiency    Nursing notes including past medical history and social history reviewed and considered in documentation Labs/vital reviewed myself and considered during evaluation     Sharyon Cable, MD 08/05/14 [phone]

## 2014-08-05 NOTE — Progress Notes (Addendum)
  CARE MANAGEMENT [place] NOTE 08/05/2014  Patient:  Carl[place] Andrews,Carlos Andrews   Account Number:  [id number]  Date Initiated:  08/05/2014  Documentation initiated by:  Carl[place] Andrews,Carlos Andrews  Subjective/Objective Assessment:   81 yr old [place] via EMS from home with fall off the bed last night due to, "I lost my balance." Denies dizziness, weakness LOC. Alert, oriented x4, at present. Per patient he normally walks with walker. Unable to     Subjective/Objective Assessment Detail:   get out of bed this AM. Wife called EMS. Denies pain.       pcp [place] as confirmed by wife  Son reports not living with parents  Wife states she has been primary care giver since [id number] Wife made aware that pt would be d/c home per EDP  Wife responded she did not need anyone to clean when Andrews discussed the length of stay for home Andrews staff in home Andrews explained home Andrews staff only complete tasks ordered by EDP or pcp Wife voiced understanding  Denied need for DME has walker     Action/Plan:   [facility] for pt spoke with son who referred Andrews to pt wife. call attempts to wife home & cell but each call answered & disconnected at 1214 Andrews updated Carlos Andrews about this Request   Action/Plan Detail:   call from wife if arrives to [place]. 1232 wife arrived assessed see notes below 1304 faxed lists of [place] Andrews agencies & Carlos Andrews to [phone] with fax confirmation Pending response from wife   Anticipated DC Date:  08/05/2014     Status Recommendation to Physician:   Result of Recommendation:    Other [place] Services  Consult Working Plan    DC Planning Services  Other  Outpatient Services - Pt will follow up   Carlos Andrews  HOME Andrews   Andrews offered to / List presented to:            Status of service:  Completed, signed off  Carlos Comments:   Carlos Comments Detail:   08/05/14 1433 Carlos Andrews called to speak with Carlos Andrews, Carlos Andrews to see if wife has  provided a Andrews of home Andrews agency Reports left and has recently returned but no Andrews at this time.  Andrews will attempt to return call again to see if Andrews made    Andrews reviewed in details medicare guidelines, home Andrews [place] [facility] [place](HH) (length of stay in home, [facility] of [facility], coverage, primary caregiver, up to 24 hrs before services may be started) and Private duty nursing (PDN-coverage, length of stay in the home types of staff available),  Andrews reviewed availability of HH SW to assist pcp to get pt to snf (if desired disposition) from the community level. Wife does not want snf and states he has never been in a facility Andrews provided family with a list of [place] home Andrews agencies and PDN Wife wants time to review home Andrews list before providing her Andrews of agency Andrews explained to her that if she can provided prior to pt d/c Andrews can assist with services but if she does not decide for a few days Andrews will may need to refer her to pcp for home Andrews agency follow up Andrews agreed to fax lists for review to Carlos Andrews Will have Carlos Andrews give lists to wife, Carlos Andrews

## 2014-08-05 NOTE — ED Notes (Signed)
Case manager on phone with family

## 2014-08-05 NOTE — ED Notes (Signed)
Pt arrives via EMS from home with fall off the bed last night due to, "I lost my balance." Denies dizziness, weakness LOC. Alert, oriented x4, at present. Per patient he normally walks with walker. Unable to get out of bed this AM. Wife called EMS. Denies pain.

## 2014-08-06 ENCOUNTER — Emergency Department (HOSPITAL_COMMUNITY)
Admission: EM | Admit: 2014-08-06 | Discharge: 2014-08-06 | Disposition: A | Discharge status: Home / Self Care | Payer: Medicare Other | Attending: Emergency Medicine | Admitting: Emergency Medicine

## 2014-08-06 ENCOUNTER — Encounter (HOSPITAL_COMMUNITY): Payer: Self-pay | Admitting: Emergency Medicine

## 2014-08-06 DIAGNOSIS — E669 Obesity, unspecified: Secondary | ICD-10-CM | POA: Insufficient documentation

## 2014-08-06 DIAGNOSIS — Z79899 Other long term (current) drug therapy: Secondary | ICD-10-CM | POA: Insufficient documentation

## 2014-08-06 DIAGNOSIS — R2243 Localized swelling, mass and lump, lower limb, bilateral: Secondary | ICD-10-CM | POA: Diagnosis not present

## 2014-08-06 DIAGNOSIS — Z872 Personal history of diseases of the skin and subcutaneous tissue: Secondary | ICD-10-CM | POA: Insufficient documentation

## 2014-08-06 DIAGNOSIS — Z8719 Personal history of other diseases of the digestive system: Secondary | ICD-10-CM | POA: Diagnosis not present

## 2014-08-06 DIAGNOSIS — Z7409 Other reduced mobility: Secondary | ICD-10-CM | POA: Insufficient documentation

## 2014-08-06 DIAGNOSIS — Z8669 Personal history of other diseases of the nervous system and sense organs: Secondary | ICD-10-CM | POA: Insufficient documentation

## 2014-08-06 DIAGNOSIS — Z87828 Personal history of other (healed) physical injury and trauma: Secondary | ICD-10-CM | POA: Diagnosis not present

## 2014-08-06 DIAGNOSIS — I10 Essential (primary) hypertension: Secondary | ICD-10-CM | POA: Insufficient documentation

## 2014-08-06 DIAGNOSIS — M1990 Unspecified osteoarthritis, unspecified site: Secondary | ICD-10-CM | POA: Diagnosis not present

## 2014-08-06 DIAGNOSIS — E119 Type 2 diabetes mellitus without complications: Secondary | ICD-10-CM | POA: Insufficient documentation

## 2014-08-06 DIAGNOSIS — Z7982 Long term (current) use of aspirin: Secondary | ICD-10-CM | POA: Insufficient documentation

## 2014-08-06 DIAGNOSIS — Z87438 Personal history of other diseases of male genital organs: Secondary | ICD-10-CM | POA: Diagnosis not present

## 2014-08-06 DIAGNOSIS — R531 Weakness: Secondary | ICD-10-CM | POA: Diagnosis present

## 2014-08-06 DIAGNOSIS — Z87891 Personal history of nicotine dependence: Secondary | ICD-10-CM | POA: Insufficient documentation

## 2014-08-06 LAB — CBG MONITORING, ED: Glucose-Capillary: 202 mg/dL — ABNORMAL HIGH (ref 70–99)

## 2014-08-06 NOTE — Discharge Instructions (Signed)

## 2014-08-06 NOTE — Progress Notes (Signed)
08/06/2014 1:22 PM Lizbeth Feijoo, Justice DeedsJUDITH SPARKS, RN       [facility] with patient and family. Daughter-in-law and son voiced concerns regarding patient's progressive weakness and inability of patient's wife to care for patient at home. Son states, "he is so weak and he falls a lot and my mother cannot get him up off the floor". Patient is not interested in rehab at a nursing facility. Patient and wife are interested in home health. Reports having experience with [facility] in the past. No orders noted in chart from EDP for home health orders. Will attempt to leave message for Dr. McDiarmid's office to arrange. No other needs identified at this time.

## 2014-08-06 NOTE — ED Notes (Signed)
Per EMS pt got up this morning feeling weak and slipped off bed this morning while getting out of bed. Pt denies any pain and denies hitting head. Pt sts he has been feeling weak for the past month. Pt was in the ED yesterday for similar symptoms.

## 2014-08-06 NOTE — ED Provider Notes (Addendum)
CSN: [id number]     Arrival date & time 08/06/14  0102 History   First MD Initiated Contact with Patient 08/06/14 [phone]     Chief Complaint  Patient presents with  . Weakness     (Consider location/radiation/quality/duration/timing/severity/associated sxs/prior Treatment) HPI The patient reports that he's been having weakness in his legs for about 2 months or more. He reports that when he tries to transition to standing his feet slip out from under him, particularly on carpets in his home. He reports if he has shoes with good grips on them and is on a smooth floor he is able to get up much better. He reports he's used a walker for a year or more. He reports his ability to use his walker has not changed and he is not having any increasing difficulty with that. He denies any problems with his upper extremity function. He reports that he has started physical therapy and the last several weeks and that does seem to be helping. He does not have any complaints of back pain or lower extremity pain. He denies that he's been ill with nausea vomiting or diarrhea. He reports his appetite is good and he is eating. As the patient has had this problem for at least a number of months and feels that he is improving with physical therapy, I asked what he was hoping to get help with today in the emergency department and he replied, "getting his legs to work better." Past Medical History  Diagnosis Date  . DIABETES MELLITUS, II, COMPLICATIONS 6/64/4034  . Esophageal perforation 1993    managed at [facility]  . PPD positive 1993    Found at [facility]  . Spinal stenosis of lumbar region at multiple levels   . Proteinuria 08/07/2006    Qualifier: Diagnosis of  By: Drucie Ip    . Open angle with borderline findings and low glaucoma risk in both eyes 02/23/2013  . Regular astigmatism 02/23/2013  . Presbyopia 02/23/2013  . Ptosis of left eyelid 02/23/2013  . At risk for falling 10/04/2013  . GAIT DISTURBANCE 08/01/2008         . HYPERLIPIDEMIA 08/07/2006    Qualifier: Diagnosis of  By: Drucie Ip    . HYPERTENSION, BENIGN SYSTEMIC 08/07/2006    Qualifier: Diagnosis of  By: Drucie Ip    . 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 LUMBAR SPRAIN AND STRAIN 07/19/2008    Qualifier: Diagnosis of  By: Lindell Noe MD, Jeneen Rinks    . Myopia with presbyopia of left eye 02/23/2013  . OBESITY, NOS 08/07/2006    Qualifier: Diagnosis of  By: Drucie Ip    . OSTEOARTHRITIS, LOWER LEG 08/07/2006    Qualifier: Diagnosis of  By: Drucie Ip    . PERIPHERAL EDEMA 08/01/2008    Qualifier: Diagnosis of  By: Walker Kehr MD, Patrick Jupiter    . Peripheral vascular disease, unspecified 04/21/2012    Severe tibial occlusive disease (Dr Kellie Simmering, [facility], 04/2002)     . Recurrent falls 10/04/2013  . Skin ulcer of foot, left heel 02/23/2013  . Type II or unspecified type diabetes mellitus without mention of complication, not stated as uncontrolled 08/07/2006    Qualifier: Diagnosis of  By: Drucie Ip    . Unspecified hereditary and idiopathic peripheral neuropathy 08/07/2006        . Proteinuria 08/07/2006  . Impairment of balance 05/10/2014  . Urge incontinence 05/10/2014  . Impaired mobility and ADLs  05/10/2014   Past Surgical History  Procedure Laterality Date  . Neck surgery  1993    s/p C1-2, C2-3 anterior cervical diskectomies, partial corpectomies through thoracotomy at [place]  . Cataract extraction      Cataract surgery   . Thoracotomy  1993    s/p T3-T4 osteomyelitis with multiple open drainages including thoracotomy at [place]  . Spinal cord decompression  1993    s/p T1-T2 spinal cord compression secondary to granualtion tissue and abscess material secondary to previous mediastinitis at [place]  . Jejunostomy feeding tube  1993    s/p Feeding jejunostomy [place]  . Esophageal perforation  1993    Hx of esophageal perforation complication 3149  . Lumbar epidural injection   2010    Epidural corticosteroid injection for L2-3 & L4-5 severe spinal stenosis at [place] on 9/10, 10/10, 11/10     Family History  Problem Relation Age of Onset  . Diabetes Mother   . Diabetes Father   . Hypertension Father   . Other Father     amputaion  . Diabetes Son   . Hypertension Son    History  Substance Use Topics  . Smoking status: Former Smoker    Types: Cigarettes    Quit date: 04/22/1987  . Smokeless tobacco: Never Used  . Alcohol Use: No    Review of Systems  10 Systems reviewed and are negative for acute change except as noted in the HPI.  Allergies  Naprosyn; Rofecoxib; and Tramadol hcl  Home Medications   Prior to Admission medications   Medication Sig Start Date End Date Taking? Authorizing Provider  acetaminophen (TYLENOL) 325 MG tablet Take 650 mg by mouth every 6 (six) hours as needed for mild pain.   Yes Historical Provider, MD  Ascorbic Acid (VITAMIN C) 500 MG CHEW Chew by mouth daily.     Yes Historical Provider, MD  aspirin 81 MG tablet Take 81 mg by mouth daily.     Yes Historical Provider, MD  lisinopril (PRINIVIL,ZESTRIL) 20 MG tablet Take 1 tablet (20 mg total) by mouth at bedtime. 02/09/14  Yes Blane Ohara McDiarmid, MD  Blood Glucose Monitoring Suppl (ONE TOUCH ULTRA SYSTEM KIT) W/DEVICE KIT 1 kit by Does not apply route once. 07/02/12   Blane Ohara McDiarmid, MD  calcium-vitamin D (OSCAL WITH D 250-125) 250-125 MG-UNIT per tablet Take 1 tablet by mouth daily.      Historical Provider, MD  Fish Oil OIL Take 1,000 mg by mouth daily.     Historical Provider, MD  glimepiride (AMARYL) 2 MG tablet Take 0.5 tablets (1 mg total) by mouth at bedtime. 04/12/14   Blane Ohara McDiarmid, MD  glucose blood (RELION CONFIRM/MICRO TEST) test strip 1 each by Other route daily. ICD-10: E08.42.  Testing once a day 04/04/14   Blane Ohara McDiarmid, MD  Multiple Vitamin (MULTIVITAMIN) tablet Take 1 tablet by mouth daily.      Historical Provider, MD  POTASSIUM CHLORIDE PO  Take 1 tablet by mouth daily. Pt states unsure of strength    Historical Provider, MD  Pyridoxine HCl (VITAMIN B-6) 100 MG tablet 3 tabs once daily.     Historical Provider, MD  ReliOn Alcohol Swabs PADS Check blood sugar twice daily.     Historical Provider, MD  ReliOn Ultra Thin Lancets MISC 1 each by Does not apply route 2 (two) times daily before a meal. 07/13/12   Blane Ohara McDiarmid, MD  vitamin B-12 (CYANOCOBALAMIN) 1000 MCG  tablet Take 1,000 mcg by mouth daily.      Historical Provider, MD   BP 130/69 mmHg  Pulse 91  Temp(Src) 98.1 F (36.7 C) (Oral)  Resp 15  SpO2 100% Physical Exam  Constitutional: He is oriented to person, place, and time. He appears well-developed and well-nourished.  The patient is alert and in no distress. He is well in appearance. His cognitive function is normal and his respiratory status is calm and appropriate.  HENT:  Head: Normocephalic and atraumatic.  Mouth/Throat: Oropharynx is clear and moist.  Eyes: EOM are normal. Pupils are equal, round, and reactive to light.  Neck: Neck supple.  Cardiovascular: Normal rate, regular rhythm, normal heart sounds and intact distal pulses.   Pulmonary/Chest: Effort normal and breath sounds normal.  Abdominal: Soft. Bowel sounds are normal. He exhibits no distension. There is no tenderness.  Musculoskeletal: Normal range of motion. He exhibits edema. He exhibits no tenderness.  The patient has approximately 1+ pitting edema bilateral feet and lower legs. The condition of his feet is good. He has long toenails however none are secondarily infected with any ingrown toenails. No parents of active cellulitis. The patient does not have pain to palpation of his lower extremity. The patient can elevate each leg independently off of the stretcher and perform dorsiflexion. The patient is generally deconditioned and his musculature. He uses both upper extremities symmetrically and uses him to elevate himself. He does however have  deconditioning and atrophy of muscles throughout his body.  Neurological: He is alert and oriented to person, place, and time. He has normal strength. No cranial nerve deficit. GCS eye subscore is 4. GCS verbal subscore is 5. GCS motor subscore is 6.  There is no focal extremity weakness. The patient appears to be more globally deconditioned.  Skin: Skin is warm, dry and intact.  Psychiatric: He has a normal mood and affect.    ED Course  Procedures (including critical care time) Labs Review Labs Reviewed  CBG MONITORING, ED    Imaging Review No results found.   EKG Interpretation None      MDM   Final diagnoses:  Impaired mobility and ADLs   The patient appears to be suffering from chronic progressive deconditioning and gait dysfunction. He has a old history from 2010 of spinal stenosis. He has no back pain complaints. Reviewing the notes, the primary provider has been working on physical therapy and in home care assistance. There are no acute changes today. The patient had diagnostic labs done yesterday as well as a urinalysis with no findings to suggest a metabolic or an infectious source. The patient's main objective in the emergency department is to get assistance at home to help relieve his wife with care and ADL activities. The patient reports he wants to stay in his home with additional care preferentially, rather than nursing home placement.    Charlesetta Shanks, MD 08/06/14 1032  Charlesetta Shanks, MD 08/06/14 [phone]

## 2014-08-07 NOTE — Progress Notes (Signed)
[facility] fax[facility] to [phone] as requested  by wife, as per J. Minnich CM.  fax confirmation received. Updated patient and wife, [facility] No further ED CM needs identified.

## 2014-08-08 ENCOUNTER — Telehealth: Payer: Self-pay | Admitting: Family Medicine

## 2014-08-08 ENCOUNTER — Encounter (HOSPITAL_COMMUNITY): Payer: Self-pay | Admitting: *Deleted

## 2014-08-08 ENCOUNTER — Emergency Department (HOSPITAL_COMMUNITY)
Admission: EM | Admit: 2014-08-08 | Discharge: 2014-08-08 | Disposition: A | Discharge status: Home / Self Care | Payer: Medicare Other | Attending: Emergency Medicine | Admitting: Emergency Medicine

## 2014-08-08 ENCOUNTER — Emergency Department (HOSPITAL_COMMUNITY): Payer: Medicare Other

## 2014-08-08 DIAGNOSIS — E119 Type 2 diabetes mellitus without complications: Secondary | ICD-10-CM | POA: Insufficient documentation

## 2014-08-08 DIAGNOSIS — Z87828 Personal history of other (healed) physical injury and trauma: Secondary | ICD-10-CM | POA: Diagnosis not present

## 2014-08-08 DIAGNOSIS — M179 Osteoarthritis of knee, unspecified: Secondary | ICD-10-CM | POA: Insufficient documentation

## 2014-08-08 DIAGNOSIS — W1811XA Fall from or off toilet without subsequent striking against object, initial encounter: Secondary | ICD-10-CM | POA: Diagnosis not present

## 2014-08-08 DIAGNOSIS — E785 Hyperlipidemia, unspecified: Secondary | ICD-10-CM | POA: Diagnosis not present

## 2014-08-08 DIAGNOSIS — Z8611 Personal history of tuberculosis: Secondary | ICD-10-CM | POA: Diagnosis not present

## 2014-08-08 DIAGNOSIS — Z043 Encounter for examination and observation following other accident: Secondary | ICD-10-CM | POA: Diagnosis present

## 2014-08-08 DIAGNOSIS — R296 Repeated falls: Secondary | ICD-10-CM | POA: Diagnosis not present

## 2014-08-08 DIAGNOSIS — Y92091 Bathroom in other non-institutional residence as the place of occurrence of the external cause: Secondary | ICD-10-CM | POA: Diagnosis not present

## 2014-08-08 DIAGNOSIS — Y9389 Activity, other specified: Secondary | ICD-10-CM | POA: Diagnosis not present

## 2014-08-08 DIAGNOSIS — Z7982 Long term (current) use of aspirin: Secondary | ICD-10-CM | POA: Insufficient documentation

## 2014-08-08 DIAGNOSIS — Z87891 Personal history of nicotine dependence: Secondary | ICD-10-CM | POA: Insufficient documentation

## 2014-08-08 DIAGNOSIS — E669 Obesity, unspecified: Secondary | ICD-10-CM | POA: Diagnosis not present

## 2014-08-08 DIAGNOSIS — Z87448 Personal history of other diseases of urinary system: Secondary | ICD-10-CM | POA: Diagnosis not present

## 2014-08-08 DIAGNOSIS — Z7409 Other reduced mobility: Secondary | ICD-10-CM | POA: Diagnosis not present

## 2014-08-08 DIAGNOSIS — I10 Essential (primary) hypertension: Secondary | ICD-10-CM | POA: Insufficient documentation

## 2014-08-08 DIAGNOSIS — Z8669 Personal history of other diseases of the nervous system and sense organs: Secondary | ICD-10-CM | POA: Insufficient documentation

## 2014-08-08 DIAGNOSIS — Z872 Personal history of diseases of the skin and subcutaneous tissue: Secondary | ICD-10-CM | POA: Diagnosis not present

## 2014-08-08 DIAGNOSIS — Z79899 Other long term (current) drug therapy: Secondary | ICD-10-CM | POA: Diagnosis not present

## 2014-08-08 DIAGNOSIS — Z8719 Personal history of other diseases of the digestive system: Secondary | ICD-10-CM | POA: Insufficient documentation

## 2014-08-08 DIAGNOSIS — Y998 Other external cause status: Secondary | ICD-10-CM | POA: Insufficient documentation

## 2014-08-08 LAB — COMPREHENSIVE METABOLIC PANEL
ALT: 104 U/L — ABNORMAL HIGH (ref 0–53)
AST: 61 U/L — AB (ref 0–37)
Albumin: 3.2 g/dL — ABNORMAL LOW (ref 3.5–5.2)
Alkaline Phosphatase: 108 U/L (ref 39–117)
Anion gap: 7 (ref 5–15)
BUN: 42 mg/dL — ABNORMAL HIGH (ref 6–23)
CHLORIDE: 105 mmol/L (ref 96–112)
CO2: 26 mmol/L (ref 19–32)
Calcium: 9 mg/dL (ref 8.4–10.5)
Creatinine, Ser: 1.64 mg/dL — ABNORMAL HIGH (ref 0.50–1.35)
GFR calc Af Amer: 44 mL/min — ABNORMAL LOW (ref >90)
GFR calc non Af Amer: 38 mL/min — ABNORMAL LOW (ref >90)
Glucose, Bld: 219 mg/dL — ABNORMAL HIGH (ref 70–99)
Potassium: 4.1 mmol/L (ref 3.5–5.1)
Sodium: 138 mmol/L (ref 135–145)
Total Bilirubin: 2.9 mg/dL — ABNORMAL HIGH (ref 0.3–1.2)
Total Protein: 6.6 g/dL (ref 6.0–8.3)

## 2014-08-08 LAB — URINALYSIS, ROUTINE W REFLEX MICROSCOPIC
GLUCOSE, UA: 500 mg/dL — AB
Ketones, ur: NEGATIVE mg/dL
Nitrite: POSITIVE — AB
PROTEIN: 30 mg/dL — AB
Specific Gravity, Urine: 1.025 (ref 1.005–1.030)
Urobilinogen, UA: 0.2 mg/dL (ref 0.0–1.0)
pH: 5 (ref 5.0–8.0)

## 2014-08-08 LAB — CBC WITH DIFFERENTIAL/PLATELET
BASOS ABS: 0 10*3/uL (ref 0.0–0.1)
Basophils Relative: 0 % (ref 0–1)
Eosinophils Absolute: 0 10*3/uL (ref 0.0–0.7)
Eosinophils Relative: 0 % (ref 0–5)
HCT: 39.5 % (ref 39.0–52.0)
Hemoglobin: 13.4 g/dL (ref 13.0–17.0)
LYMPHS ABS: 0.8 10*3/uL (ref 0.7–4.0)
LYMPHS PCT: 6 % — AB (ref 12–46)
MCH: 28.2 pg (ref 26.0–34.0)
MCHC: 33.9 g/dL (ref 30.0–36.0)
MCV: 83 fL (ref 78.0–100.0)
Monocytes Absolute: 1 10*3/uL (ref 0.1–1.0)
Monocytes Relative: 7 % (ref 3–12)
Neutro Abs: 12.4 10*3/uL — ABNORMAL HIGH (ref 1.7–7.7)
Neutrophils Relative %: 88 % — ABNORMAL HIGH (ref 43–77)
PLATELETS: 72 10*3/uL — AB (ref 150–400)
RBC: 4.76 MIL/uL (ref 4.22–5.81)
RDW: 16.6 % — ABNORMAL HIGH (ref 11.5–15.5)
WBC: 14.2 10*3/uL — AB (ref 4.0–10.5)

## 2014-08-08 LAB — URINE MICROSCOPIC-ADD ON

## 2014-08-08 NOTE — Telephone Encounter (Signed)
Son is calling and would like to speak to Dr. McDiarmid about his father. His father has fallen 7 times in 2 days and each time they end up at the ER by ambulance. He would like to know about getting his father is some type og skilled nursing home. Please call the son at [phone].jw

## 2014-08-08 NOTE — ED Notes (Signed)
Gave pt some water to drink pt began to aspirate, had to suction pt to clear his airway

## 2014-08-08 NOTE — ED Notes (Addendum)
Patient fell last night and EMS was called but patient refused to be transported. He was getting off of the toilet today and fell to his bottom but complains of bilateral leg pain, but states that his leg pain is not related to the fall. He states that his legs were hurting from sitting on the toilet for so long. Patient has history of weakness and pain in legs. Patient requires a walker for ambulation at home. Patient states that he tripped over a rug in the bathroom. He did not want to come to the ED, his son called EMS.

## 2014-08-08 NOTE — ED Provider Notes (Signed)
CSN: [id number]     Arrival date & time 08/08/14  1625 History   First MD Initiated Contact with Patient 08/08/14 1631     Chief Complaint  Patient presents with  . Fall     HPI Patient has had frequent falls for the past year.  His had multiple falls the past week.  His family is concerned that there no longer able to take care of him at home and are seeking placement into a nursing home.  The patient is not agreeable to this.  No fevers or chills.  No recent vomiting or diarrhea.  Appetite has been normal.  He usually walks with a walker but his family is concerned about his safety using this walker.  Today he was getting off of the toilet and fell to his bottom.   Past Medical History  Diagnosis Date  . DIABETES MELLITUS, II, COMPLICATIONS 6/43/3295  . Esophageal perforation 1993    managed at [facility]  . PPD positive 1993    Found at [facility]  . Spinal stenosis of lumbar region at multiple levels   . Proteinuria 08/07/2006    Qualifier: Diagnosis of  By: Drucie Ip    . Open angle with borderline findings and low glaucoma risk in both eyes 02/23/2013  . Regular astigmatism 02/23/2013  . Presbyopia 02/23/2013  . Ptosis of left eyelid 02/23/2013  . At risk for falling 10/04/2013  . GAIT DISTURBANCE 08/01/2008        . HYPERLIPIDEMIA 08/07/2006    Qualifier: Diagnosis of  By: Drucie Ip    . HYPERTENSION, BENIGN SYSTEMIC 08/07/2006    Qualifier: Diagnosis of  By: Drucie Ip    . 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 LUMBAR SPRAIN AND STRAIN 07/19/2008    Qualifier: Diagnosis of  By: Lindell Noe MD, Jeneen Rinks    . Myopia with presbyopia of left eye 02/23/2013  . OBESITY, NOS 08/07/2006    Qualifier: Diagnosis of  By: Drucie Ip    . OSTEOARTHRITIS, LOWER LEG 08/07/2006    Qualifier: Diagnosis of  By: Drucie Ip    . PERIPHERAL EDEMA 08/01/2008    Qualifier: Diagnosis of  By: Walker Kehr MD, Patrick Jupiter    . Peripheral vascular disease, unspecified  04/21/2012    Severe tibial occlusive disease (Dr Kellie Simmering, [facility], 04/2002)     . Recurrent falls 10/04/2013  . Skin ulcer of foot, left heel 02/23/2013  . Type II or unspecified type diabetes mellitus without mention of complication, not stated as uncontrolled 08/07/2006    Qualifier: Diagnosis of  By: Drucie Ip    . Unspecified hereditary and idiopathic peripheral neuropathy 08/07/2006        . Proteinuria 08/07/2006  . Impairment of balance 05/10/2014  . Urge incontinence 05/10/2014  . Impaired mobility and ADLs 05/10/2014   Past Surgical History  Procedure Laterality Date  . Neck surgery  1993    s/p C1-2, C2-3 anterior cervical diskectomies, partial corpectomies through thoracotomy at [place]  . Cataract extraction      Cataract surgery   . Thoracotomy  1993    s/p T3-T4 osteomyelitis with multiple open drainages including thoracotomy at [place]  . Spinal cord decompression  1993    s/p T1-T2 spinal cord compression secondary to granualtion tissue and abscess material secondary to previous mediastinitis at [place]  . Jejunostomy feeding tube  1993    s/p Feeding jejunostomy [place]  . Esophageal  perforation  1993    Hx of esophageal perforation complication 1610  . Lumbar epidural injection  2010    Epidural corticosteroid injection for L2-3 & L4-5 severe spinal stenosis at [place] on 9/10, 10/10, 11/10     Family History  Problem Relation Age of Onset  . Diabetes Mother   . Diabetes Father   . Hypertension Father   . Other Father     amputaion  . Diabetes Son   . Hypertension Son    History  Substance Use Topics  . Smoking status: Former Smoker    Types: Cigarettes    Quit date: 04/22/1987  . Smokeless tobacco: Never Used  . Alcohol Use: No    Review of Systems  All other systems reviewed and are negative.     Allergies  Naprosyn; Rofecoxib; and Tramadol hcl  Home Medications   Prior to Admission medications   Medication Sig  Start Date End Date Taking? Authorizing Provider  acetaminophen (TYLENOL) 325 MG tablet Take 650 mg by mouth every 6 (six) hours as needed for mild pain.   Yes Historical Provider, MD  Ascorbic Acid (VITAMIN C) 500 MG CHEW Chew by mouth daily.     Yes Historical Provider, MD  aspirin 81 MG tablet Take 81 mg by mouth daily.     Yes Historical Provider, MD  Blood Glucose Monitoring Suppl (ONE TOUCH ULTRA SYSTEM KIT) W/DEVICE KIT 1 kit by Does not apply route once. 07/02/12  Yes Blane Ohara McDiarmid, MD  calcium-vitamin D (OSCAL WITH D 250-125) 250-125 MG-UNIT per tablet Take 1 tablet by mouth daily.     Yes Historical Provider, MD  Fish Oil OIL Take 1,000 mg by mouth daily.    Yes Historical Provider, MD  glucose blood (RELION CONFIRM/MICRO TEST) test strip 1 each by Other route daily. ICD-10: E08.42.  Testing once a day 04/04/14  Yes Todd D McDiarmid, MD  lisinopril (PRINIVIL,ZESTRIL) 20 MG tablet Take 1 tablet (20 mg total) by mouth at bedtime. 02/09/14  Yes Blane Ohara McDiarmid, MD  Multiple Vitamin (MULTIVITAMIN) tablet Take 1 tablet by mouth daily.     Yes Historical Provider, MD  POTASSIUM CHLORIDE PO Take 1 tablet by mouth daily. Pt states unsure of strength   Yes Historical Provider, MD  Pyridoxine HCl (VITAMIN B-6) 100 MG tablet 3 tabs once daily.    Yes Historical Provider, MD  ReliOn Alcohol Swabs PADS Check blood sugar twice daily.    Yes Historical Provider, MD  ReliOn Ultra Thin Lancets MISC 1 each by Does not apply route 2 (two) times daily before a meal. 07/13/12  Yes Blane Ohara McDiarmid, MD  vitamin B-12 (CYANOCOBALAMIN) 1000 MCG tablet Take 1,000 mcg by mouth daily.     Yes Historical Provider, MD  glimepiride (AMARYL) 2 MG tablet Take 0.5 tablets (1 mg total) by mouth at bedtime. Patient not taking: Reported on 08/08/2014 04/12/14   Blane Ohara McDiarmid, MD   BP 125/84 mmHg  Pulse 90  Temp(Src) 98 F (36.7 C) (Oral)  Resp 14  Ht 5' 8"  (1.727 m)  Wt 165 lb (74.844 kg)  BMI 25.09 kg/m2  SpO2  98% Physical Exam  Constitutional: He appears well-developed and well-nourished.  HENT:  Head: Normocephalic and atraumatic.  Eyes: EOM are normal.  Neck: Normal range of motion.  Cardiovascular: Normal rate, regular rhythm, normal heart sounds and intact distal pulses.   Pulmonary/Chest: Effort normal and breath sounds normal. No respiratory distress.  Abdominal: Soft. He exhibits no  distension. There is no tenderness.  Musculoskeletal: Normal range of motion.  Full range of motion of major joints.  Grossly normal strength in his arms and legs.  Neurological: He is alert.  Skin: Skin is warm and dry.  Psychiatric: He has a normal mood and affect. Judgment normal.  Nursing note and vitals reviewed.   ED Course  Procedures (including critical care time) Labs Review Labs Reviewed  CBC WITH DIFFERENTIAL/PLATELET - Abnormal; Notable for the following:    WBC 14.2 (*)    RDW 16.6 (*)    Platelets 72 (*)    Neutrophils Relative % 88 (*)    Neutro Abs 12.4 (*)    Lymphocytes Relative 6 (*)    All other components within normal limits  COMPREHENSIVE METABOLIC PANEL - Abnormal; Notable for the following:    Glucose, Bld 219 (*)    BUN 42 (*)    Creatinine, Ser 1.64 (*)    Albumin 3.2 (*)    AST 61 (*)    ALT 104 (*)    Total Bilirubin 2.9 (*)    GFR calc non Af Amer 38 (*)    GFR calc Af Amer 44 (*)    All other components within normal limits  URINALYSIS, ROUTINE W REFLEX MICROSCOPIC   BUN  Date Value Ref Range Status  08/08/2014 42* 6 - 23 mg/dL Final  08/05/2014 27* 6 - 23 mg/dL Final  02/22/2013 20 6 - 23 mg/dL Final  02/20/2012 18 6 - 23 mg/dL Final   CREAT  Date Value Ref Range Status  02/22/2013 1.22 0.50 - 1.35 mg/dL Final  02/20/2012 1.27 0.50 - 1.35 mg/dL Final  03/28/2011 1.22 0.50 - 1.35 mg/dL Final    Comment:     Please note change in reference range(s).      CREATININE, SER  Date Value Ref Range Status  08/08/2014 1.64* 0.50 - 1.35 mg/dL Final   08/05/2014 1.60* 0.50 - 1.35 mg/dL Final  04/02/2010 1.28 0.40-1.50 mg/dL Final  05/01/2009 1.37 0.40-1.50 mg/dL Final       Imaging Review Ct Head Wo Contrast  08/08/2014   CLINICAL DATA:  PT FELL GETTING OFF TOILET TODAY. PT DID NOT HAVE LOC OR HIT HIS HEAD. PT STATES NO PAIN  EXAM: CT HEAD WITHOUT CONTRAST  TECHNIQUE: Contiguous axial images were obtained from the base of the skull through the vertex without intravenous contrast.  COMPARISON:  None.  FINDINGS: No acute intracranial hemorrhage. No focal mass lesion. No CT evidence of acute infarction. No midline shift or mass effect. No hydrocephalus. Basilar cisterns are patent.  There is extensive cortical atrophy and proportional ventricular dilatation. Extensive periventricular white matter hypodensities.  Paranasal sinuses and  mastoid air cells are clear.  IMPRESSION: 1. No intracranial trauma. 2. Extensive cortical atrophy. 3. Chronic white matter microvascular disease.   Electronically Signed   By: Suzy Bouchard M.D.   On: 08/08/2014 18:04  I personally reviewed the imaging tests through PACS system I reviewed available ER/hospitalization records through the EMR    EKG Interpretation None      MDM   Final diagnoses:  Frequent falls    Patient was seen by social work and case management.  Outpatient resources and given.  No indication for acute hospitalization today.  Baseline renal insufficiency.  Head CT is normal.  Moves all major joints.  Review the record demonstrates that his had frequent falls for nearly a year now.  We will provide additional resources for home including  home health nursing, PT, OT, a, social work.  We'll also write for bedside commode.  I told the family that his ambulation with his walker is very unstable and this is been the case for some time.  I told the family I don't think he is safe to be walking with his walker anymore and have recommended a wheelchair.  I've written the patient for a  wheelchair and a bedside commode.    Hoy Morn, MD 08/08/14 2110

## 2014-08-08 NOTE — ED Notes (Signed)
Bed: WA17 Expected date:  Expected time:  Means of arrival:  Comments: EMS- 81yo M, Fall, leg pain

## 2014-08-08 NOTE — Telephone Encounter (Signed)
Will forward to MD for review. Jazmin Hartsell,CMA  

## 2014-08-08 NOTE — Telephone Encounter (Signed)
I spoke with Carlos Carlos ProHerman Marriott, Carlos Andrews.  He is the son of my patient Carlos Andrews.  He described how his father is falling very frequently at home.   He has [facility]. No HH is coming out to the home anymore.  Patient has no personal care services other than from his elderly wife.  Carlos Andrews, Carlos Andrews, is concerned that his father cannot live at home safely.  He feels his mother can no longer provide adequate care for him.  He has shared his concern with his parents.  Carlos Carlos Andrews reluctantly is will to go into a Nursing Home.  I told Carlos Andrews, Carlos Andrews that I would not be able to admit his father into a Skilled Nursing Facility from the community.  He would still like to pursue placement.   A/ Frequent Falls,      Impairment of Mobility, Balance and ADLs.      Patient is not cognitively impaired.   P/ Referral of case to Theresia Boughorma Wilson to talk with Carlos Carlos Andrews, Carlos Andrews, about the process of admitting from the community to a ALF / nursing home.   _________________________________________________  I spoke with Carlos Andrews at her home by phone.  She is concerned about her husbands frequent falling.  She has taken him to the ED after falls on 08/05/14 and 08/06/14. They have evaluated him and sent him home.   Carlos Andrews has no assistance at home in caring for her husband.  She is in agreement with placement of her husband into the  ALF / nursing home.  She was agreeable to our involvement of her son, Carlos Andrews, Carlos Andrews, in the process of seeking nursing home placement.   I informed Carlos Andrews that I would be asking our Social Worker to contact her son to begin the process of nursing home placement for ALF / nursing home.

## 2014-08-08 NOTE — Progress Notes (Signed)
CSW met with pt at bedside. Family was present. Wife states that pt was brought in by family. Pt confirms that he presents to [facility] due to fall. Pt informed CSW that he has been falling often within the past month or 2. Pt states that he believes it may be due to weakness in his legs. Son and wife confirm that the pt lives at home with wife.  CSW asked pt would he be interested in ALF. However, pt states that he is not interested in going to a facility at this time. Pt states that he wants to go home. Pt states that in the future instead of ever going to a facility he would like to go live with his son who lives in [place]. According to pt and wife, son is aware of this future plan.  CSW consulted with Nurse CM who confirms that home health services are in place.   Pt stated that he would like a beverage. CSW made nurse aware, who states that it is okay for pt to have something to drink.  CSW asked pt and wife if they had any questions. However, they informed CSW that they do not have any questions at this time.  [place]. [phone] Wife/Shirely Girard  [place], [place] ED CSW 08/08/2014 6:42 PM

## 2014-08-08 NOTE — Progress Notes (Signed)
ED CM consulted by EDP, Campos about this pt Reports family stating not able to care for pt. [facility] as CM confirmed, pending contact with [facility] on 08/08/14 am. [facility] ED PM CM updated

## 2014-08-08 NOTE — Progress Notes (Addendum)
EDCM spoke to patient and his family at bedside.  Patient confirms he lives at home with his wife.  Patient has a walker and an elevated toilet seat at home, no other medical equipment.  [facility] seen [facility] on 02/[facility], home health [facility] and [facility].  Patient reports and wife confirms that patient has had increased falls at home while ambulating to the bathroom.  Patient and his wife report that the rug "is slick" and sometimes gets bunched up when you walk on it.  [facility] encouraged patient patient and family to remove anything on the floor that would cause and increase in falls such as a throw rug.  EDCM asked patient and his wife if they would be interested in having a bedside commode at home so that he didn't have far to walk?  Patient and family are in agreement.  EDCM also offered idea of having urinal at bedside at home.  Discussed with patient and family about adding OT, aide and social work to home health orders, social worker to assist in placement if needed.  Patient and family in agreement.  Discussed patient with EDP.  Awaiting disposition.  Discussed patient with EDSW who will see patient.  No further EDCM needs at this time.  Wife provided phone number [phone] to contact for beginning of services.  08/08/2014 2152pm.  Patient to be discharged home.  [facility] at 2125pm with confirmation of receipt at 2129pm. DME orders for bedside commode and wheelchair [facility] at 2134pm with confirmation of receipt at 2137pm.  Spoke to patient and his wife at bedside, confirmed address and phone number.  [facility] assessed for other questions or needs at this time.  No further EDCM needs at this time.

## 2014-08-08 NOTE — Progress Notes (Addendum)
On 08/07/14 [facility] ED CM received a voice message left by son [phone] at 0939 on 08/06/14 requesting Cm to "pick an agency that [employer] will pay for and send someone" to the homeand a hang up call from [phone] at 1118 on 08/06/14. ED CM noted Cm notes from weekend cms x 2 (08/06/14 & 08/07/14) Cm noted [facility] listed as choice of agency for family  [id number] Cm called and spoke with [facility] staff to see if other referral information needed. Staff confirmed all information received and they are not making attempts to reach pt Cm verified the home number in EPIC. [facility] confirmed they also have home # as [phone] Dr Perley JainMcdiarmid notified of choice of home health agency as [facility] via EPIC in basket note

## 2014-08-08 NOTE — ED Notes (Signed)
Patient was able to void but spouse disposed of urine.

## 2014-08-09 ENCOUNTER — Telehealth: Payer: Self-pay | Admitting: Clinical

## 2014-08-09 ENCOUNTER — Telehealth: Payer: Self-pay | Admitting: Family Medicine

## 2014-08-09 NOTE — Telephone Encounter (Signed)
CSW contacted pts son Eustaquio MaizeHerman Jr. Son states he is at work but can briefly take CSW's call. Son confirms that he is interested in short term rehab for pt, not anything long term. Son just [place] the facility [place] in [place] and not too far. Son is agreeable for CSW to do a SNF search and follow up with bed offers when available.  CSW will complete an FL2, submit for a pasrr and follow up with bed offers.  Theresia BoughNorma Tremeka Helbling, MSW, LCSW [phone]

## 2014-08-09 NOTE — Telephone Encounter (Signed)
CSW called pt to confirm wishes for SNF placement. CSW provided pt with education regarding with SNF is and that spouse is able to visit pt daily. Pt stated he would be agreeable to placement and agreeable for CSW to follow up with pts son as well.  CSW will contact pt and son with bed offers when available.  Theresia BoughNorma Pooja Camuso, MSW, LCSW [phone]

## 2014-08-09 NOTE — Telephone Encounter (Signed)
Will forward to MD to see if he wants to work him in on a geri clinic or his regular clinic. Jazmin Hartsell,CMA

## 2014-08-09 NOTE — Telephone Encounter (Signed)
I spoke with Carlos Carlos Andrews (Andrews) about his three ED visits for Falls over the last 3 days.  I informed him that, in my opinion, he is not safe to continue living in his apartment with just his elderly Andrews looking after him, even with further home PT and more DMEs.    I recommended that he discuss ALF or SNF placement with Carlos Andrews, SW.    I spoke with Carlos Andrews, and she agrees that she cannot take care of her husband by herself any longer.  She would be willing to consider ALF or SNF.   See my telephone note with Carlos Carlos Andrews' son, Carlos Andrews, Carlos Andrews. From 08/08/14 for details of our discussion about placement of his father.  Carlos Carlos Andrews wants his father in a living situation where he can get more supervision and physical assistance.   I have asked Ms Carlos Andrews to contact Carlos Carlos Andrews and his Andrews about their placement options and process.

## 2014-08-09 NOTE — Telephone Encounter (Signed)
[facility] and advised to get an  appt with Dr Sherrine Maplesmcdirmaid ASAP. No appts available untl end of march Would like to talk to dr Rebeca Allegramcdiarmed about this

## 2014-08-10 ENCOUNTER — Encounter: Payer: Self-pay | Admitting: Clinical

## 2014-08-10 NOTE — Progress Notes (Signed)
EDCM called to follow up with patient at this time and spoke to patient's wife.  Patient's wife reports the patient has received his wheelchair and the bedside commode.  Patient's wife also reports that [facility] services as well.  "I feel better now that I spoke to the [facility] worker [facility]."  Patient's wife thankful for services.  No further EDCM needs at this time.

## 2014-08-10 NOTE — Progress Notes (Signed)
[facility] and was informed that CSW would need to send most recent H&P/progress notes from a hospital stay and any [facility]. Once the documents are received they will be reviewed to determine whether pt will qualify for SNF placement. CSW contacted [place] with [facility] who will assist CSW in obtaining the necessary therapy notes.  Theresia BoughNorma Emmajo Andrews, MSW, LCSW [phone]

## 2014-08-12 ENCOUNTER — Emergency Department (HOSPITAL_COMMUNITY): Payer: Medicare Other

## 2014-08-12 ENCOUNTER — Telehealth: Payer: Self-pay | Admitting: Clinical

## 2014-08-12 ENCOUNTER — Telehealth: Payer: Self-pay | Admitting: *Deleted

## 2014-08-12 ENCOUNTER — Inpatient Hospital Stay (HOSPITAL_COMMUNITY)
Admission: EM | Admit: 2014-08-12 | Discharge: 2014-08-19 | DRG: 518 | Disposition: A | Discharge status: Skilled Nursing Facility | Payer: Medicare Other | Attending: Family Medicine | Admitting: Family Medicine

## 2014-08-12 ENCOUNTER — Encounter (HOSPITAL_COMMUNITY): Payer: Self-pay | Admitting: *Deleted

## 2014-08-12 DIAGNOSIS — M5116 Intervertebral disc disorders with radiculopathy, lumbar region: Secondary | ICD-10-CM | POA: Diagnosis present

## 2014-08-12 DIAGNOSIS — K6812 Psoas muscle abscess: Secondary | ICD-10-CM | POA: Diagnosis present

## 2014-08-12 DIAGNOSIS — E785 Hyperlipidemia, unspecified: Secondary | ICD-10-CM | POA: Diagnosis present

## 2014-08-12 DIAGNOSIS — M4646 Discitis, unspecified, lumbar region: Secondary | ICD-10-CM | POA: Diagnosis present

## 2014-08-12 DIAGNOSIS — I5040 Unspecified combined systolic (congestive) and diastolic (congestive) heart failure: Secondary | ICD-10-CM | POA: Diagnosis present

## 2014-08-12 DIAGNOSIS — M4806 Spinal stenosis, lumbar region: Secondary | ICD-10-CM | POA: Diagnosis present

## 2014-08-12 DIAGNOSIS — F329 Major depressive disorder, single episode, unspecified: Secondary | ICD-10-CM | POA: Diagnosis present

## 2014-08-12 DIAGNOSIS — R7881 Bacteremia: Secondary | ICD-10-CM | POA: Diagnosis not present

## 2014-08-12 DIAGNOSIS — Z9181 History of falling: Secondary | ICD-10-CM | POA: Diagnosis not present

## 2014-08-12 DIAGNOSIS — R29898 Other symptoms and signs involving the musculoskeletal system: Secondary | ICD-10-CM | POA: Diagnosis present

## 2014-08-12 DIAGNOSIS — E1142 Type 2 diabetes mellitus with diabetic polyneuropathy: Secondary | ICD-10-CM | POA: Diagnosis present

## 2014-08-12 DIAGNOSIS — E669 Obesity, unspecified: Secondary | ICD-10-CM | POA: Diagnosis present

## 2014-08-12 DIAGNOSIS — N179 Acute kidney failure, unspecified: Secondary | ICD-10-CM | POA: Diagnosis not present

## 2014-08-12 DIAGNOSIS — R1310 Dysphagia, unspecified: Secondary | ICD-10-CM | POA: Diagnosis not present

## 2014-08-12 DIAGNOSIS — K5900 Constipation, unspecified: Secondary | ICD-10-CM | POA: Diagnosis present

## 2014-08-12 DIAGNOSIS — F32A Depression, unspecified: Secondary | ICD-10-CM

## 2014-08-12 DIAGNOSIS — G609 Hereditary and idiopathic neuropathy, unspecified: Secondary | ICD-10-CM | POA: Diagnosis present

## 2014-08-12 DIAGNOSIS — I739 Peripheral vascular disease, unspecified: Secondary | ICD-10-CM | POA: Diagnosis present

## 2014-08-12 DIAGNOSIS — N183 Chronic kidney disease, stage 3 (moderate): Secondary | ICD-10-CM | POA: Diagnosis present

## 2014-08-12 DIAGNOSIS — Z66 Do not resuscitate: Secondary | ICD-10-CM | POA: Diagnosis present

## 2014-08-12 DIAGNOSIS — R32 Unspecified urinary incontinence: Secondary | ICD-10-CM | POA: Diagnosis present

## 2014-08-12 DIAGNOSIS — Z87891 Personal history of nicotine dependence: Secondary | ICD-10-CM

## 2014-08-12 DIAGNOSIS — M5416 Radiculopathy, lumbar region: Secondary | ICD-10-CM

## 2014-08-12 DIAGNOSIS — I129 Hypertensive chronic kidney disease with stage 1 through stage 4 chronic kidney disease, or unspecified chronic kidney disease: Secondary | ICD-10-CM | POA: Diagnosis present

## 2014-08-12 DIAGNOSIS — D72829 Elevated white blood cell count, unspecified: Secondary | ICD-10-CM | POA: Insufficient documentation

## 2014-08-12 DIAGNOSIS — Z981 Arthrodesis status: Secondary | ICD-10-CM

## 2014-08-12 DIAGNOSIS — M4726 Other spondylosis with radiculopathy, lumbar region: Secondary | ICD-10-CM | POA: Diagnosis present

## 2014-08-12 DIAGNOSIS — R531 Weakness: Secondary | ICD-10-CM

## 2014-08-12 DIAGNOSIS — W1830XA Fall on same level, unspecified, initial encounter: Secondary | ICD-10-CM | POA: Diagnosis present

## 2014-08-12 DIAGNOSIS — G8929 Other chronic pain: Secondary | ICD-10-CM | POA: Diagnosis present

## 2014-08-12 DIAGNOSIS — E1122 Type 2 diabetes mellitus with diabetic chronic kidney disease: Secondary | ICD-10-CM | POA: Insufficient documentation

## 2014-08-12 DIAGNOSIS — Z789 Other specified health status: Secondary | ICD-10-CM

## 2014-08-12 DIAGNOSIS — R410 Disorientation, unspecified: Secondary | ICD-10-CM | POA: Diagnosis not present

## 2014-08-12 DIAGNOSIS — I10 Essential (primary) hypertension: Secondary | ICD-10-CM | POA: Insufficient documentation

## 2014-08-12 DIAGNOSIS — R296 Repeated falls: Secondary | ICD-10-CM | POA: Insufficient documentation

## 2014-08-12 DIAGNOSIS — Z9889 Other specified postprocedural states: Secondary | ICD-10-CM | POA: Insufficient documentation

## 2014-08-12 DIAGNOSIS — E1165 Type 2 diabetes mellitus with hyperglycemia: Secondary | ICD-10-CM | POA: Diagnosis not present

## 2014-08-12 DIAGNOSIS — W19XXXA Unspecified fall, initial encounter: Secondary | ICD-10-CM

## 2014-08-12 DIAGNOSIS — Z419 Encounter for procedure for purposes other than remedying health state, unspecified: Secondary | ICD-10-CM

## 2014-08-12 LAB — URINALYSIS, ROUTINE W REFLEX MICROSCOPIC
Glucose, UA: >1000 mg/dL — AB
Hgb urine dipstick: NEGATIVE
KETONES UR: 15 mg/dL — AB
Leukocytes, UA: NEGATIVE
Nitrite: NEGATIVE
PROTEIN: 30 mg/dL — AB
Specific Gravity, Urine: 1.02 (ref 1.005–1.030)
Urobilinogen, UA: 1 mg/dL (ref 0.0–1.0)
pH: 5 (ref 5.0–8.0)

## 2014-08-12 LAB — COMPREHENSIVE METABOLIC PANEL
ALK PHOS: 104 U/L (ref 39–117)
ALT: 48 U/L (ref 0–53)
AST: 22 U/L (ref 0–37)
Albumin: 2.4 g/dL — ABNORMAL LOW (ref 3.5–5.2)
Anion gap: 10 (ref 5–15)
BILIRUBIN TOTAL: 2.4 mg/dL — AB (ref 0.3–1.2)
BUN: 26 mg/dL — ABNORMAL HIGH (ref 6–23)
CHLORIDE: 105 mmol/L (ref 96–112)
CO2: 24 mmol/L (ref 19–32)
Calcium: 8.5 mg/dL (ref 8.4–10.5)
Creatinine, Ser: 1.54 mg/dL — ABNORMAL HIGH (ref 0.50–1.35)
GFR calc non Af Amer: 41 mL/min — ABNORMAL LOW (ref >90)
GFR, EST AFRICAN AMERICAN: 48 mL/min — AB (ref >90)
GLUCOSE: 350 mg/dL — AB (ref 70–99)
POTASSIUM: 4.2 mmol/L (ref 3.5–5.1)
Sodium: 139 mmol/L (ref 135–145)
Total Protein: 5.9 g/dL — ABNORMAL LOW (ref 6.0–8.3)

## 2014-08-12 LAB — CBC WITH DIFFERENTIAL/PLATELET
BASOS ABS: 0 10*3/uL (ref 0.0–0.1)
Basophils Relative: 0 % (ref 0–1)
EOS PCT: 0 % (ref 0–5)
Eosinophils Absolute: 0 10*3/uL (ref 0.0–0.7)
HEMATOCRIT: 37.9 % — AB (ref 39.0–52.0)
Hemoglobin: 13 g/dL (ref 13.0–17.0)
LYMPHS ABS: 0.3 10*3/uL — AB (ref 0.7–4.0)
LYMPHS PCT: 2 % — AB (ref 12–46)
MCH: 28.2 pg (ref 26.0–34.0)
MCHC: 34.3 g/dL (ref 30.0–36.0)
MCV: 82.2 fL (ref 78.0–100.0)
Monocytes Absolute: 0.1 10*3/uL (ref 0.1–1.0)
Monocytes Relative: 1 % — ABNORMAL LOW (ref 3–12)
NEUTROS PCT: 97 % — AB (ref 43–77)
Neutro Abs: 15.8 10*3/uL — ABNORMAL HIGH (ref 1.7–7.7)
PLATELETS: 121 10*3/uL — AB (ref 150–400)
RBC: 4.61 MIL/uL (ref 4.22–5.81)
RDW: 16.8 % — AB (ref 11.5–15.5)
WBC: 16.2 10*3/uL — AB (ref 4.0–10.5)

## 2014-08-12 LAB — URINE MICROSCOPIC-ADD ON

## 2014-08-12 LAB — CK: CK TOTAL: 122 U/L (ref 7–232)

## 2014-08-12 LAB — TROPONIN I
TROPONIN I: 0.05 ng/mL — AB (ref <0.031)
Troponin I: 0.12 ng/mL — ABNORMAL HIGH (ref <0.031)

## 2014-08-12 MED ORDER — INSULIN ASPART 100 UNIT/ML ~~LOC~~ SOLN
0.0000 [IU] | Freq: Three times a day (TID) | SUBCUTANEOUS | Status: DC
  Administered 2014-08-13 – 2014-08-14 (×3): 5 [IU] via SUBCUTANEOUS
  Administered 2014-08-14 – 2014-08-15 (×4): 3 [IU] via SUBCUTANEOUS

## 2014-08-12 NOTE — ED Provider Notes (Signed)
CSN: [id number]     Arrival date & time 08/12/14  1528 History   First MD Initiated Contact with Patient 08/12/14 1609     Chief Complaint  Patient presents with  . Back Pain  . Generalized Body Aches     (Consider location/radiation/quality/duration/timing/severity/associated sxs/prior Treatment) HPI Comments: Patient presents to the ER for evaluation of generalized weakness and pain. Patient reports for the last 2 or 3 days he has been hurting all over. He reports that everywhere hurts, but he has had specific pain in his lower back. This pain is severe and constant. Patient reports that the pain in the lower back worsens with movement and prevented him from getting out of bed this morning. He has not had any known fevers. No chest pain, shortness of breath.  Patient is a 81 y.o. male presenting with back pain.  Back Pain   Past Medical History  Diagnosis Date  . DIABETES MELLITUS, II, COMPLICATIONS 08/07/2006  . Esophageal perforation 1993    managed at [facility]  . PPD positive 1993    Found at [facility]  . Spinal stenosis of lumbar region at multiple levels   . Proteinuria 08/07/2006    Qualifier: Diagnosis of  By: Haydee Salter    . Open angle with borderline findings and low glaucoma risk in both eyes 02/23/2013  . Regular astigmatism 02/23/2013  . Presbyopia 02/23/2013  . Ptosis of left eyelid 02/23/2013  . At risk for falling 10/04/2013  . GAIT DISTURBANCE 08/01/2008        . HYPERLIPIDEMIA 08/07/2006    Qualifier: Diagnosis of  By: Haydee Salter    . HYPERTENSION, BENIGN SYSTEMIC 08/07/2006    Qualifier: Diagnosis of  By: Haydee Salter    . IMPOTENCE, ORGANIC 08/07/2006    Qualifier: Diagnosis of  By: Haydee Salter    . LUMBAR SPRAIN AND STRAIN 07/19/2008    Qualifier: Diagnosis of  By: Mauricio Po MD, Fayrene Fearing    . Myopia with presbyopia of left eye 02/23/2013  . OBESITY, NOS 08/07/2006    Qualifier: Diagnosis of  By: Haydee Salter    . OSTEOARTHRITIS, LOWER LEG 08/07/2006   Qualifier: Diagnosis of  By: Haydee Salter    . PERIPHERAL EDEMA 08/01/2008    Qualifier: Diagnosis of  By: Sheffield Slider MD, Deniece Portela    . Peripheral vascular disease, unspecified 04/21/2012    Severe tibial occlusive disease (Dr Hart [place], Vasc Surgery, 04/2002)     . Recurrent falls 10/04/2013  . Skin ulcer of foot, left heel 02/23/2013  . Type II or unspecified type diabetes mellitus without mention of complication, not stated as uncontrolled 08/07/2006    Qualifier: Diagnosis of  By: Haydee Salter    . Unspecified hereditary and idiopathic peripheral neuropathy 08/07/2006        . Proteinuria 08/07/2006  . Impairment of balance 05/10/2014  . Urge incontinence 05/10/2014  . Impaired mobility and ADLs 05/10/2014   Past Surgical History  Procedure Laterality Date  . Neck surgery  1993    s/p C1-2, C2-3 anterior cervical diskectomies, partial corpectomies through thoracotomy at [facility] 1993  . Cataract extraction      Cataract surgery   . Thoracotomy  1993    s/p T3-T4 osteomyelitis with multiple open drainages including thoracotomy at [facility] 1993  . Spinal cord decompression  1993    s/p T1-T2 spinal cord compression secondary to granualtion tissue and abscess material secondary to previous mediastinitis at [facility] 1993  . Jejunostomy feeding tube  1993  s/p Feeding jejunostomy [facility] 1993  . Esophageal perforation  1993    Hx of esophageal perforation complication 1993  . Lumbar epidural injection  2010    Epidural corticosteroid injection for L2-3 & L4-5 severe spinal stenosis at [facility] on 9/10, 10/10, 11/10     Family History  Problem Relation Age of Onset  . Diabetes Mother   . Diabetes Father   . Hypertension Father   . Other Father     amputaion  . Diabetes Son   . Hypertension Son    History  Substance Use Topics  . Smoking status: Former Smoker    Types: Cigarettes    Quit date: 04/22/1987  . Smokeless tobacco: Never Used  . Alcohol Use: No    Review of Systems    Constitutional: Positive for fatigue.  Musculoskeletal: Positive for back pain.  All other systems reviewed and are negative.     Allergies  Naprosyn; Rofecoxib; and Tramadol hcl  Home Medications   Prior to Admission medications   Medication Sig Start Date End Date Taking? Authorizing Provider  acetaminophen (TYLENOL) 325 MG tablet Take 650 mg by mouth every 6 (six) hours as needed for mild pain.   Yes Historical Provider, MD  Ascorbic Acid (VITAMIN C) 500 MG CHEW Chew by mouth daily.     Yes Historical Provider, MD  aspirin 81 MG tablet Take 81 mg by mouth daily.     Yes Historical Provider, MD  calcium-vitamin D (OSCAL WITH D 250-125) 250-125 MG-UNIT per tablet Take 1 tablet by mouth daily.     Yes Historical Provider, MD  Fish Oil OIL Take 1,000 mg by mouth daily.    Yes Historical Provider, MD  lisinopril (PRINIVIL,ZESTRIL) 20 MG tablet Take 1 tablet (20 mg total) by mouth at bedtime. 02/09/14  Yes Leighton Roach McDiarmid, MD  Multiple Vitamin (MULTIVITAMIN) tablet Take 1 tablet by mouth daily.     Yes Historical Provider, MD  Pyridoxine HCl (VITAMIN B-6) 100 MG tablet 3 tabs once daily.    Yes Historical Provider, MD  vitamin B-12 (CYANOCOBALAMIN) 1000 MCG tablet Take 1,000 mcg by mouth daily.     Yes Historical Provider, MD  glimepiride (AMARYL) 2 MG tablet Take 0.5 tablets (1 mg total) by mouth at bedtime. Patient not taking: Reported on 08/08/2014 04/12/14   Leighton Roach McDiarmid, MD   BP 123/67 mmHg  Pulse 92  Temp(Src) 98.1 F (36.7 C) (Oral)  Resp 15  SpO2 98% Physical Exam  Constitutional: He is oriented to person, place, and time. He appears well-developed and well-nourished. No distress.  HENT:  Head: Normocephalic and atraumatic.  Right Ear: Hearing normal.  Left Ear: Hearing normal.  Nose: Nose normal.  Mouth/Throat: Oropharynx is clear and moist and mucous membranes are normal.  Eyes: Conjunctivae and EOM are normal. Pupils are equal, round, and reactive to light.  Neck:  Normal range of motion. Neck supple.  Cardiovascular: Regular rhythm, S1 normal and S2 normal.  Exam reveals no gallop and no friction rub.   No murmur heard. Pulmonary/Chest: Effort normal and breath sounds normal. No respiratory distress. He exhibits no tenderness.  Abdominal: Soft. Normal appearance and bowel sounds are normal. There is no hepatosplenomegaly. There is no tenderness. There is no rebound, no guarding, no tenderness at McBurney's point and negative Murphy's sign. No hernia.  Musculoskeletal: Normal range of motion.       Lumbar back: He exhibits tenderness.       Back:  Neurological: He  is alert and oriented to person, place, and time. He has normal strength. No cranial nerve deficit or sensory deficit. Coordination normal. GCS eye subscore is 4. GCS verbal subscore is 5. GCS motor subscore is 6.  Skin: Skin is warm, dry and intact. No rash noted. No cyanosis.  Psychiatric: He has a normal mood and affect. His speech is normal and behavior is normal. Thought content normal.  Nursing note and vitals reviewed.   ED Course  Procedures (including critical care time) Labs Review Labs Reviewed  CBC WITH DIFFERENTIAL/PLATELET - Abnormal; Notable for the following:    WBC 16.2 (*)    HCT 37.9 (*)    RDW 16.8 (*)    Platelets 121 (*)    Neutrophils Relative % 97 (*)    Neutro Abs 15.8 (*)    Lymphocytes Relative 2 (*)    Lymphs Abs 0.3 (*)    Monocytes Relative 1 (*)    All other components within normal limits  COMPREHENSIVE METABOLIC PANEL - Abnormal; Notable for the following:    Glucose, Bld 350 (*)    BUN 26 (*)    Creatinine, Ser 1.54 (*)    Total Protein 5.9 (*)    Albumin 2.4 (*)    Total Bilirubin 2.4 (*)    GFR calc non Af Amer 41 (*)    GFR calc Af Amer 48 (*)    All other components within normal limits  URINALYSIS, ROUTINE W REFLEX MICROSCOPIC - Abnormal; Notable for the following:    Glucose, UA >1000 (*)    Bilirubin Urine SMALL (*)    Ketones, ur 15  (*)    Protein, ur 30 (*)    All other components within normal limits  TROPONIN I - Abnormal; Notable for the following:    Troponin I 0.05 (*)    All other components within normal limits  URINE MICROSCOPIC-ADD ON - Abnormal; Notable for the following:    Squamous Epithelial / LPF FEW (*)    Bacteria, UA FEW (*)    Casts GRANULAR CAST (*)    All other components within normal limits  CK  TROPONIN I    Imaging Review Dg Chest 2 View  08/12/2014   CLINICAL DATA:  Low back pain without trauma.  EXAM: CHEST  2 VIEW  COMPARISON:  None.  FINDINGS: Lateral view degraded by patient arm position. Lower thoracic spondylosis is moderate. Surgical clips which project over the right upper lobe and right lung apex medially. Midline trachea. Marked cardiomegaly with atherosclerosis in the transverse aorta. No pleural effusion or pneumothorax. No congestive failure. Clear lungs.  IMPRESSION: Mild cardiomegaly and aortic atherosclerosis.  No acute findings.   Electronically Signed   By: Jeronimo GreavesKyle  Talbot M.D.   On: 08/12/2014 20:05   Dg Thoracic Spine 2 View  08/12/2014   CLINICAL DATA:  Thoracic back pain.  EXAM: THORACIC SPINE - 2 VIEW  COMPARISON:  None.  FINDINGS: There is no evidence of thoracic spine fracture. Alignment is normal. Degenerative osteophytosis is seen at multiple levels in the mid lower thoracic spine and the upper lumbar spine. No focal lytic or sclerotic bone lesions identified.  IMPRESSION: No acute findings.  Degenerative spondylosis, as described above.   Electronically Signed   By: Myles RosenthalJohn  Stahl M.D.   On: 08/12/2014 20:06   Dg Lumbar Spine Complete  08/12/2014   CLINICAL DATA:  No injury.  Low back pain.  EXAM: LUMBAR SPINE - COMPLETE 4+ VIEW  COMPARISON:  MRI  02/17/2009 lumbar spine  FINDINGS: Five lumbar type vertebral bodies. Mild superior endplate irregularity at L3 is unchanged. Advanced degenerative disc disease and spondylosis. L2 through S1. No malalignment. Advanced facet  arthropathy within the lower lumbar spine and lumbosacral junction. No acute vertebral body height loss. Aortic and branch vessel atherosclerosis.  IMPRESSION: Advanced lumbar spondylosis, without acute superimposed process.   Electronically Signed   By: Jeronimo Greaves M.D.   On: 08/12/2014 20:03     EKG Interpretation   Date/Time:  Friday August 12 2014 15:34:33 EST Ventricular Rate:  97 PR Interval:  181 QRS Duration: 108 QT Interval:  379 QTC Calculation: 481 R Axis:   -38 Text Interpretation:  Sinus rhythm Probable left atrial enlargement  Incomplete left bundle branch block Borderline prolonged QT interval  Baseline wander in lead(s) V6 No significant change since last tracing  Confirmed by Elia Nunley  MD, Barney Russomanno [phone]) on 08/12/2014 5:55:18 PM      MDM   Final diagnoses:  Fall  Lumbar radiculopathy, acute  Weakness  Frequent falls    Presented to the ER for evaluation of generalized weakness. Patient has had multiple falls in the last week. Patient has been seen in the ER 3 other times prior to today because of the falls. He lives at home with his wife. He apparently has had a progressive decline in his ability to walk and ambulate without falling. He has previously declined placement.  Patient's workup today is largely unremarkable. X-ray of the spine did not show any acute abnormality. He does report low back pain that radiates to the legs with movement, but does not have any sensory or motor deficit.  Patient does not have any acute EKG changes, but he does have a very slightly elevated troponin. He is not experiencing any chest pain or shortness of breath.  Patient has been unable to ambulate here in the ER. He is unable to sit up in the bed on his own without help. He cannot be discharged to home in this condition. We'll ask family medicine to evaluate him for admission.    Gilda Crease, MD 08/12/14 2239

## 2014-08-12 NOTE — ED Notes (Signed)
Unable to ambulate pt in hallway. Pt was unable to stand or get out of bed.

## 2014-08-12 NOTE — ED Notes (Signed)
Talbert ForestShirley - Wife - [phone]

## 2014-08-12 NOTE — ED Notes (Signed)
Pt presents via [facility] c/o back pain and generalized body pain.  Pt states "I'm sore all over".  Per report pt was unable to get out of bed due to pain.  Pt a x 4, NAD.  Bp-150/90 P-80 R-18.

## 2014-08-12 NOTE — H&P (Signed)
Family Medicine Teaching [facility] Admission History and Physical Service Pager: [phone]  Patient name: Carlos Andrews Medical record number: [id number] Date of birth: [birth year] Age: 81 y.o. Gender: male  Primary Care Provider: MCDIARMID,TODD D, MD Consultants: None Code Status: DNR (discussed on admission)  Chief Complaint: Weakness and recurrent falls  Assessment and Plan: Carlos Andrews is a 81 y.o. male presenting with bilateral lower extremity weakness. PMH is significant for T2DM, HTN, and recurrent falls.  Weakness: hx of recurrent falls and gait instability. Multiple factors predisposing for falls and weakness: visual impairment, peripheral neuropathy, impaired balance, poor leg strength, deconditioning. Now unable to stand. Progressively worsening although patient seems to have had this issues for at least the last six years. Lumbar MRI in 2010 significant for multiple levels of disc bulge and spinal stenosis Wife primary caretaker (she is 36) and it appears that she is unable to care for patient any longer. DG thoracic and lumbar with severe spondylosis. Labs with mild leukocytosis, elevated troponin with no EKG changes.  - Admit to med-surg under Dr. Randolm Idol - PT/OT consulted - will hold off on further imaging at this time - CSW consulted - fall precautions  CKD stage 3: Unsure of baseline but last Cr 1.2 in 2014. On admission 1.54. Elevation likely due to volume depletion. UA with >1000 glucose and ketones no acidosis to suggest DKA at this time.  -BUN/Cr elevated but appears  Near baseline - 1L bolus ordered and then IVF @125mL  -BMET in morning - avoid nephrotoxic agents  Back Pain: DG lumbar and thoracic significant for degenerative spondylosis. Chronic pain. Hx of spinal stenosis of lumbar region. -Tylenol prn for pain  HTN: Normotensive. -Continue home Lisinopril.  T2DM: Last A1c 5.4 (09/2013). Patient takes Amaryl at home.  - Will recheck A1c - Holding  Amaryl at this time  - Sensitive SSI  History of left heel pressure ulcer - heel pads  Elevated troponin: no chest pain. Heart score: 3. EKG with non-specific t-wave changes that are unchanged from EKG seven days ago. - Troponin in AM - EKG in AM  FEN/GI: Carb modified diet/ mIVF @125  NS Prophylaxis: Hep Subq  Disposition: Admit to inpatient service.  History of Present Illness: Carlos Andrews is a 81 y.o. male presenting with worsening weakness and recurrent falls. PMH is significant for HTN, T2DM, and recurrent falls.  Patient states that he was brought to the hospital by his wife because he was unable to stand today and she was unable to get him up. He says he has had progressive and worsening leg weakness over the last month. At home patient uses a wheelchair to ambulate. He endorses associated lower back pain and feeling very week. Patient states he is unable to do anything by himself at home without assistance. Describes the pain in his back as sharp and radiating down both legs. Denies pain currently and says it is intermittent. He does endorse urinary incontinence but not bowel. Denies Cp, SOB, fevers.   Review Of Systems: Per HPI. Otherwise 12 point review of systems was performed and was unremarkable.  Patient Active Problem List   Diagnosis Date Noted  . Spinal stenosis of lumbar region 05/10/2014  . Impairment of balance 05/10/2014  . Thenar atrophy, bilateral, right greater than left.  05/10/2014  . Urge incontinence 05/10/2014  . Impaired mobility and ADLs 05/10/2014  . At risk for falling 10/04/2013  . Recurrent falls 10/04/2013  . Skin ulcer of foot, left heel 02/23/2013  .  Myopia with presbyopia of left eye 02/23/2013  . Open angle with borderline findings and low glaucoma risk in both eyes 02/23/2013  . Regular astigmatism 02/23/2013  . Presbyopia 02/23/2013  . Ptosis of right eyelid 02/23/2013  . Peripheral vascular disease, unspecified 04/21/2012  .  Atherosclerosis of native arteries of extremity with intermittent claudication 04/21/2012  . GAIT DISTURBANCE 08/01/2008  . LUMBAR SPRAIN AND STRAIN 07/19/2008  . KIDNEY DISEASE, CHRONIC, STAGE III 03/13/2007  . Diabetes mellitus due to underlying condition with diabetic polyneuropathy 08/07/2006  . HYPERLIPIDEMIA 08/07/2006  . OBESITY, NOS 08/07/2006  . Unspecified hereditary and idiopathic peripheral neuropathy 08/07/2006  . HYPERTENSION, BENIGN SYSTEMIC 08/07/2006  . 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 OSTEOARTHRITIS, LOWER LEG 08/07/2006  . Proteinuria 08/07/2006   Past Medical History: Past Medical History  Diagnosis Date  . DIABETES MELLITUS, II, COMPLICATIONS 08/07/2006  . Esophageal perforation 1993    managed at [facility]  . PPD positive 1993    Found at [facility]  . Spinal stenosis of lumbar region at multiple levels   . Proteinuria 08/07/2006    Qualifier: Diagnosis of  By: Haydee Salter    . Open angle with borderline findings and low glaucoma risk in both eyes 02/23/2013  . Regular astigmatism 02/23/2013  . Presbyopia 02/23/2013  . Ptosis of left eyelid 02/23/2013  . At risk for falling 10/04/2013  . GAIT DISTURBANCE 08/01/2008        . HYPERLIPIDEMIA 08/07/2006    Qualifier: Diagnosis of  By: Haydee Salter    . HYPERTENSION, BENIGN SYSTEMIC 08/07/2006    Qualifier: Diagnosis of  By: Haydee Salter    . IMPOTENCE, ORGANIC 08/07/2006    Qualifier: Diagnosis of  By: Haydee Salter    . LUMBAR SPRAIN AND STRAIN 07/19/2008    Qualifier: Diagnosis of  By: Mauricio Po MD, Fayrene Fearing    . Myopia with presbyopia of left eye 02/23/2013  . OBESITY, NOS 08/07/2006    Qualifier: Diagnosis of  By: Haydee Salter    . OSTEOARTHRITIS, LOWER LEG 08/07/2006    Qualifier: Diagnosis of  By: Haydee Salter    . PERIPHERAL EDEMA 08/01/2008    Qualifier: Diagnosis of  By: Sheffield Slider MD, Deniece Portela    . Peripheral vascular disease, unspecified 04/21/2012    Severe tibial occlusive disease (Dr Hart Rochester, Vasc Surgery, 04/2002)      . Recurrent falls 10/04/2013  . Skin ulcer of foot, left heel 02/23/2013  . Type II or unspecified type diabetes mellitus without mention of complication, not stated as uncontrolled 08/07/2006    Qualifier: Diagnosis of  By: Haydee Salter    . Unspecified hereditary and idiopathic peripheral neuropathy 08/07/2006        . Proteinuria 08/07/2006  . Impairment of balance 05/10/2014  . Urge incontinence 05/10/2014  . Impaired mobility and ADLs 05/10/2014   Past Surgical History: Past Surgical History  Procedure Laterality Date  . Neck surgery  1993    s/p C1-2, C2-3 anterior cervical diskectomies, partial corpectomies through thoracotomy at [facility] 1993  . Cataract extraction      Cataract surgery   . Thoracotomy  1993    s/p T3-T4 osteomyelitis with multiple open drainages including thoracotomy at [facility] 1993  . Spinal cord decompression  1993    s/p T1-T2 spinal cord compression secondary to granualtion tissue and abscess material secondary to previous mediastinitis at [facility] 1993  . Jejunostomy feeding tube  1993    s/p Feeding jejunostomy [facility] 1993  . Esophageal perforation  1993  Hx of esophageal perforation complication 1993  . Lumbar epidural injection  2010    Epidural corticosteroid injection for L2-3 & L4-5 severe spinal stenosis at [facility] on 9/10, 10/10, 11/10     Social History: History  Substance Use Topics  . Smoking status: Former Smoker    Types: Cigarettes    Quit date: 04/22/1987  . Smokeless tobacco: Never Used  . Alcohol Use: No   Additional social history: Lives at home with wife. Please also refer to relevant sections of EMR.  Family History: Family History  Problem Relation Age of Onset  . Diabetes Mother   . Diabetes Father   . Hypertension Father   . Other Father     amputaion  . Diabetes Son   . Hypertension Son    Allergies and Medications: Allergies  Allergen Reactions  . Naprosyn [Naproxen] Other (See Comments)    GI upset  .  Rofecoxib     REACTION: dizzy  . Tramadol Hcl     REACTION: vomiting   No current facility-administered medications on file prior to encounter.   Current Outpatient Prescriptions on File Prior to Encounter  Medication Sig Dispense Refill  . acetaminophen (TYLENOL) 325 MG tablet Take 650 mg by mouth every 6 (six) hours as needed for mild pain.    . Ascorbic Acid (VITAMIN C) 500 MG CHEW Chew by mouth daily.      Marland Kitchen aspirin 81 MG tablet Take 81 mg by mouth daily.      . calcium-vitamin D (OSCAL WITH D 250-125) 250-125 MG-UNIT per tablet Take 1 tablet by mouth daily.      . Fish Oil OIL Take 1,000 mg by mouth daily.     Marland Kitchen lisinopril (PRINIVIL,ZESTRIL) 20 MG tablet Take 1 tablet (20 mg total) by mouth at bedtime. 90 tablet 3  . Multiple Vitamin (MULTIVITAMIN) tablet Take 1 tablet by mouth daily.      . Pyridoxine HCl (VITAMIN B-6) 100 MG tablet 3 tabs once daily.     . vitamin B-12 (CYANOCOBALAMIN) 1000 MCG tablet Take 1,000 mcg by mouth daily.      Marland Kitchen glimepiride (AMARYL) 2 MG tablet Take 0.5 tablets (1 mg total) by mouth at bedtime. (Patient not taking: Reported on 08/08/2014) 45 tablet 3    Objective: BP 123/67 mmHg  Pulse 92  Temp(Src) 98.1 F (36.7 C) (Oral)  Resp 15  SpO2 98% Exam: General: elderly, alert, well-groomed, NAD, cooperative Head: normocephalic and atraumatic.  Eyes: vision grossly intact, PERRLA, no injection and anicteric. Extraocular motion intact. Ptosis of L eye. Bilateral cataract present but non-occlusive. Mouth: Dry mucous membranes, Oral mucosa and oropharynx reveal no lesions or exudates.  Lungs: CTAB, normal respiratory effort, no accessory muscle use, no crackles, and no wheezes.  Heart: RRR, no M/R/G.  Abdomen: Bowel sounds normal; abdomen soft and nontender. Midline hernia appreciated that is non-tender and freely reducible. No rebound tenderness. No HSM or masses palpated.  Pulses: DP/PT are full and equal bilaterally. Extremities: Trace edema, feet with  bialteral bunions and hammer toes.  Neurologic: CN grossly intact. 5/5 strength in UE and 3/5 strength in LE, sensation grossly intact, A&Ox3.  Skin: Intact. Warm and dry. Skin thickening on left posterior heel from previous pressure ulcer  Labs and Imaging: CBC BMET   Recent Labs Lab 08/12/14 1658  WBC 16.2*  HGB 13.0  HCT 37.9*  PLT 121*    Recent Labs Lab 08/12/14 1658  NA 139  K 4.2  CL 105  CO2 24  BUN 26*  CREATININE 1.54*  GLUCOSE 350*  CALCIUM 8.5     Dg Chest 2 View  08/12/2014   CLINICAL DATA:  Low back pain without trauma.  EXAM: CHEST  2 VIEW  COMPARISON:  None.  FINDINGS: Lateral view degraded by patient arm position. Lower thoracic spondylosis is moderate. Surgical clips which project over the right upper lobe and right lung apex medially. Midline trachea. Marked cardiomegaly with atherosclerosis in the transverse aorta. No pleural effusion or pneumothorax. No congestive failure. Clear lungs.  IMPRESSION: Mild cardiomegaly and aortic atherosclerosis.  No acute findings.   Electronically Signed   By: Jeronimo GreavesKyle  Talbot M.D.   On: 08/12/2014 20:05   Dg Thoracic Spine 2 View  08/12/2014   CLINICAL DATA:  Thoracic back pain.  EXAM: THORACIC SPINE - 2 VIEW  COMPARISON:  None.  FINDINGS: There is no evidence of thoracic spine fracture. Alignment is normal. Degenerative osteophytosis is seen at multiple levels in the mid lower thoracic spine and the upper lumbar spine. No focal lytic or sclerotic bone lesions identified.  IMPRESSION: No acute findings.  Degenerative spondylosis, as described above.   Electronically Signed   By: Myles RosenthalJohn  Stahl M.D.   On: 08/12/2014 20:06   Dg Lumbar Spine Complete  08/12/2014   CLINICAL DATA:  No injury.  Low back pain.  EXAM: LUMBAR SPINE - COMPLETE 4+ VIEW  COMPARISON:  MRI 02/17/2009 lumbar spine  FINDINGS: Five lumbar type vertebral bodies. Mild superior endplate irregularity at L3 is unchanged. Advanced degenerative disc disease and spondylosis. L2  through S1. No malalignment. Advanced facet arthropathy within the lower lumbar spine and lumbosacral junction. No acute vertebral body height loss. Aortic and branch vessel atherosclerosis.  IMPRESSION: Advanced lumbar spondylosis, without acute superimposed process.   Electronically Signed   By: Jeronimo GreavesKyle  Talbot M.D.   On: 08/12/2014 20:03    Pincus LargeJazma Y Phelps, DO 08/12/2014, 10:42 PM PGY-1, [facility] FPTS Intern pager: [phone], text pages welcome   I have seen and examined the patient. I have read and agree with the above note. My changes are noted in blue.  Jacquelin Hawkingalph Ryszard Socarras, MD PGY-2, [facility] 08/13/2014, 3:34 AM

## 2014-08-12 NOTE — Telephone Encounter (Signed)
PT advised pt to call 911 because when he saw him on Tuesday he was able to walk fine.  His family called today and said that he would not get out of bed and was c/o chest pain.  Per PT family had called 911 and they were there, but he wanted to make Dr. McDiarmid aware. Fleeger, Maryjo RochesterJessica Dawn

## 2014-08-12 NOTE — Telephone Encounter (Signed)
CSW contacted pts son to inform him that CSW has [facility]. [employer] to review and determine whether they will authorize SNF placement. CSW informed son that there could be a chance that pts insurance may deny placement all together or approve just a few days/weeks. CSW asked son to call CSW when he has more time to have CSW explain the process of applying for Medicaid. Carlos MaizeHerman Jr. Has agreed to contact CSW back today at 16:00 to discuss applying for Medicaid.   CSW to await a decision from [facility].  Carlos Andrews, MSW, LCSW [phone]

## 2014-08-13 ENCOUNTER — Inpatient Hospital Stay (HOSPITAL_COMMUNITY): Payer: Medicare Other | Admitting: Anesthesiology

## 2014-08-13 ENCOUNTER — Encounter (HOSPITAL_COMMUNITY): Payer: Self-pay | Admitting: Anesthesiology

## 2014-08-13 ENCOUNTER — Inpatient Hospital Stay (HOSPITAL_COMMUNITY): Payer: Medicare Other

## 2014-08-13 ENCOUNTER — Encounter (HOSPITAL_COMMUNITY)
Admission: EM | Disposition: A | Discharge status: Skilled Nursing Facility | Payer: Self-pay | Source: Home / Self Care | Attending: Family Medicine

## 2014-08-13 ENCOUNTER — Other Ambulatory Visit: Payer: Self-pay

## 2014-08-13 DIAGNOSIS — E1122 Type 2 diabetes mellitus with diabetic chronic kidney disease: Secondary | ICD-10-CM

## 2014-08-13 DIAGNOSIS — R296 Repeated falls: Secondary | ICD-10-CM

## 2014-08-13 DIAGNOSIS — D72829 Elevated white blood cell count, unspecified: Secondary | ICD-10-CM | POA: Insufficient documentation

## 2014-08-13 DIAGNOSIS — R29898 Other symptoms and signs involving the musculoskeletal system: Secondary | ICD-10-CM | POA: Insufficient documentation

## 2014-08-13 DIAGNOSIS — M5416 Radiculopathy, lumbar region: Secondary | ICD-10-CM | POA: Insufficient documentation

## 2014-08-13 DIAGNOSIS — I10 Essential (primary) hypertension: Secondary | ICD-10-CM

## 2014-08-13 DIAGNOSIS — N189 Chronic kidney disease, unspecified: Secondary | ICD-10-CM

## 2014-08-13 HISTORY — PX: LUMBAR LAMINECTOMY/DECOMPRESSION MICRODISCECTOMY: SHX5026

## 2014-08-13 LAB — BASIC METABOLIC PANEL
Anion gap: 7 (ref 5–15)
BUN: 29 mg/dL — ABNORMAL HIGH (ref 6–23)
CALCIUM: 8 mg/dL — AB (ref 8.4–10.5)
CHLORIDE: 103 mmol/L (ref 96–112)
CO2: 25 mmol/L (ref 19–32)
Creatinine, Ser: 1.56 mg/dL — ABNORMAL HIGH (ref 0.50–1.35)
GFR, EST AFRICAN AMERICAN: 47 mL/min — AB (ref >90)
GFR, EST NON AFRICAN AMERICAN: 41 mL/min — AB (ref >90)
Glucose, Bld: 334 mg/dL — ABNORMAL HIGH (ref 70–99)
Potassium: 3.8 mmol/L (ref 3.5–5.1)
Sodium: 135 mmol/L (ref 135–145)

## 2014-08-13 LAB — CBC
HEMATOCRIT: 34.6 % — AB (ref 39.0–52.0)
Hemoglobin: 11.9 g/dL — ABNORMAL LOW (ref 13.0–17.0)
MCH: 27.8 pg (ref 26.0–34.0)
MCHC: 34.4 g/dL (ref 30.0–36.0)
MCV: 80.8 fL (ref 78.0–100.0)
Platelets: 137 10*3/uL — ABNORMAL LOW (ref 150–400)
RBC: 4.28 MIL/uL (ref 4.22–5.81)
RDW: 16.8 % — AB (ref 11.5–15.5)
WBC: 18 10*3/uL — ABNORMAL HIGH (ref 4.0–10.5)

## 2014-08-13 LAB — GRAM STAIN: Gram Stain: NONE SEEN

## 2014-08-13 LAB — GLUCOSE, CAPILLARY
GLUCOSE-CAPILLARY: 183 mg/dL — AB (ref 70–99)
Glucose-Capillary: 292 mg/dL — ABNORMAL HIGH (ref 70–99)
Glucose-Capillary: 296 mg/dL — ABNORMAL HIGH (ref 70–99)
Glucose-Capillary: 285 mg/dL — ABNORMAL HIGH (ref 70–99)
Glucose-Capillary: 205 mg/dL — ABNORMAL HIGH (ref 70–99)

## 2014-08-13 LAB — TROPONIN I
Troponin I: 0.14 ng/mL — ABNORMAL HIGH (ref <0.031)
Troponin I: 0.09 ng/mL — ABNORMAL HIGH (ref <0.031)

## 2014-08-13 LAB — SEDIMENTATION RATE: SED RATE: 51 mm/h — AB (ref 0–16)

## 2014-08-13 LAB — LACTIC ACID, PLASMA: Lactic Acid, Venous: 2.3 mmol/L (ref 0.5–2.0)

## 2014-08-13 SURGERY — LUMBAR LAMINECTOMY/DECOMPRESSION MICRODISCECTOMY 1 LEVEL
Anesthesia: General | Site: Back

## 2014-08-13 MED ORDER — EPHEDRINE SULFATE 50 MG/ML IJ SOLN
INTRAMUSCULAR | Status: AC
  Filled 2014-08-13: qty 2

## 2014-08-13 MED ORDER — ACETAMINOPHEN 10 MG/ML IV SOLN
INTRAVENOUS | Status: DC | PRN
  Administered 2014-08-13: 1000 mg via INTRAVENOUS

## 2014-08-13 MED ORDER — MORPHINE SULFATE 4 MG/ML IJ SOLN: 4.0000 mg | INTRAMUSCULAR | Status: DC | PRN

## 2014-08-13 MED ORDER — ADULT MULTIVITAMIN W/MINERALS CH
1.0000 | ORAL_TABLET | Freq: Every day | ORAL | Status: DC
Start: 2014-08-13 — End: 2014-08-19
  Administered 2014-08-13 – 2014-08-19 (×7): 1 via ORAL
  Filled 2014-08-13 (×8): qty 1

## 2014-08-13 MED ORDER — HYDROMORPHONE HCL 1 MG/ML IJ SOLN
INTRAMUSCULAR | Status: AC
  Filled 2014-08-13: qty 1

## 2014-08-13 MED ORDER — FENTANYL CITRATE 0.05 MG/ML IJ SOLN
INTRAMUSCULAR | Status: AC
  Filled 2014-08-13: qty 5

## 2014-08-13 MED ORDER — CEFAZOLIN SODIUM-DEXTROSE 2-3 GM-% IV SOLR
INTRAVENOUS | Status: AC
  Filled 2014-08-13: qty 50

## 2014-08-13 MED ORDER — SODIUM CHLORIDE 0.9 % IV SOLN
INTRAVENOUS | Status: DC
  Administered 2014-08-13: 20:00:00 via INTRAVENOUS

## 2014-08-13 MED ORDER — GLYCOPYRROLATE 0.2 MG/ML IJ SOLN
INTRAMUSCULAR | Status: AC
  Filled 2014-08-13: qty 3

## 2014-08-13 MED ORDER — BUPIVACAINE HCL (PF) 0.5 % IJ SOLN
INTRAMUSCULAR | Status: DC | PRN
  Administered 2014-08-13: 23.5 mL

## 2014-08-13 MED ORDER — NEOSTIGMINE METHYLSULFATE 10 MG/10ML IV SOLN
INTRAVENOUS | Status: AC
  Filled 2014-08-13: qty 2

## 2014-08-13 MED ORDER — GLYCOPYRROLATE 0.2 MG/ML IJ SOLN
INTRAMUSCULAR | Status: DC | PRN
  Administered 2014-08-13: 0.4 mg via INTRAVENOUS

## 2014-08-13 MED ORDER — THROMBIN 5000 UNITS EX SOLR
CUTANEOUS | Status: DC | PRN
  Administered 2014-08-13 (×2): 5000 [IU] via TOPICAL

## 2014-08-13 MED ORDER — VITAMIN C 500 MG PO TABS
500.0000 mg | ORAL_TABLET | Freq: Every day | ORAL | Status: DC
  Administered 2014-08-13 – 2014-08-19 (×7): 500 mg via ORAL
  Filled 2014-08-13 (×8): qty 1

## 2014-08-13 MED ORDER — ONDANSETRON HCL 4 MG/2ML IJ SOLN
INTRAMUSCULAR | Status: DC | PRN
  Administered 2014-08-13: 4 mg via INTRAVENOUS

## 2014-08-13 MED ORDER — ACETAMINOPHEN 325 MG PO TABS
650.0000 mg | ORAL_TABLET | Freq: Once | ORAL | Status: AC
  Administered 2014-08-13: 650 mg via ORAL
  Filled 2014-08-13: qty 2

## 2014-08-13 MED ORDER — OXYCODONE-ACETAMINOPHEN 5-325 MG PO TABS
1.0000 | ORAL_TABLET | ORAL | Status: DC | PRN
  Administered 2014-08-14 (×2): 2 via ORAL
  Filled 2014-08-13 (×2): qty 2

## 2014-08-13 MED ORDER — THROMBIN 5000 UNITS EX SOLR
OROMUCOSAL | Status: DC | PRN
  Administered 2014-08-13: 5 mL via TOPICAL

## 2014-08-13 MED ORDER — VANCOMYCIN HCL IN DEXTROSE 1-5 GM/200ML-% IV SOLN
1000.0000 mg | INTRAVENOUS | Status: DC
  Administered 2014-08-14 – 2014-08-15 (×2): 1000 mg via INTRAVENOUS
  Filled 2014-08-13 (×2): qty 200

## 2014-08-13 MED ORDER — LISINOPRIL 20 MG PO TABS
20.0000 mg | ORAL_TABLET | Freq: Every day | ORAL | Status: DC
  Administered 2014-08-13: 20 mg via ORAL
  Filled 2014-08-13: qty 1

## 2014-08-13 MED ORDER — ONDANSETRON HCL 4 MG/2ML IJ SOLN
INTRAMUSCULAR | Status: AC
  Filled 2014-08-13: qty 2

## 2014-08-13 MED ORDER — HEMOSTATIC AGENTS (NO CHARGE) OPTIME
TOPICAL | Status: DC | PRN
  Administered 2014-08-13: 1 via TOPICAL

## 2014-08-13 MED ORDER — ETOMIDATE 2 MG/ML IV SOLN
INTRAVENOUS | Status: AC
  Filled 2014-08-13: qty 10

## 2014-08-13 MED ORDER — LIDOCAINE HCL (CARDIAC) 20 MG/ML IV SOLN
INTRAVENOUS | Status: AC
  Filled 2014-08-13: qty 10

## 2014-08-13 MED ORDER — CALCIUM CARBONATE-VITAMIN D 500-200 MG-UNIT PO TABS
1.0000 | ORAL_TABLET | Freq: Every day | ORAL | Status: DC
  Administered 2014-08-13 – 2014-08-19 (×7): 1 via ORAL
  Filled 2014-08-13 (×8): qty 1

## 2014-08-13 MED ORDER — GABAPENTIN 300 MG PO CAPS
300.0000 mg | ORAL_CAPSULE | Freq: Two times a day (BID) | ORAL | Status: DC
  Administered 2014-08-13 – 2014-08-19 (×13): 300 mg via ORAL
  Filled 2014-08-13 (×14): qty 1

## 2014-08-13 MED ORDER — CEFTRIAXONE SODIUM IN DEXTROSE 40 MG/ML IV SOLN
2.0000 g | Freq: Once | INTRAVENOUS | Status: DC
  Filled 2014-08-13: qty 50

## 2014-08-13 MED ORDER — ACETAMINOPHEN 10 MG/ML IV SOLN
INTRAVENOUS | Status: AC
  Filled 2014-08-13: qty 100

## 2014-08-13 MED ORDER — STERILE WATER FOR INJECTION IJ SOLN
INTRAMUSCULAR | Status: AC
  Filled 2014-08-13: qty 20

## 2014-08-13 MED ORDER — PHENYLEPHRINE HCL 10 MG/ML IJ SOLN
INTRAMUSCULAR | Status: DC | PRN
  Administered 2014-08-13 (×2): 40 ug via INTRAVENOUS

## 2014-08-13 MED ORDER — VITAMIN B-6 100 MG PO TABS
100.0000 mg | ORAL_TABLET | Freq: Three times a day (TID) | ORAL | Status: DC
  Administered 2014-08-13 – 2014-08-19 (×16): 100 mg via ORAL
  Filled 2014-08-13 (×23): qty 1

## 2014-08-13 MED ORDER — ASPIRIN EC 81 MG PO TBEC
81.0000 mg | DELAYED_RELEASE_TABLET | Freq: Every day | ORAL | Status: DC
  Administered 2014-08-13 – 2014-08-19 (×7): 81 mg via ORAL
  Filled 2014-08-13 (×10): qty 1

## 2014-08-13 MED ORDER — ARTIFICIAL TEARS OP OINT
TOPICAL_OINTMENT | OPHTHALMIC | Status: AC
  Filled 2014-08-13: qty 7

## 2014-08-13 MED ORDER — HEPARIN SODIUM (PORCINE) 5000 UNIT/ML IJ SOLN
5000.0000 [IU] | Freq: Three times a day (TID) | INTRAMUSCULAR | Status: DC
  Administered 2014-08-13: 5000 [IU] via SUBCUTANEOUS
  Filled 2014-08-13: qty 1

## 2014-08-13 MED ORDER — ROCURONIUM BROMIDE 100 MG/10ML IV SOLN
INTRAVENOUS | Status: DC | PRN
  Administered 2014-08-13: 10 mg via INTRAVENOUS

## 2014-08-13 MED ORDER — LIDOCAINE-EPINEPHRINE 1 %-1:100000 IJ SOLN
INTRAMUSCULAR | Status: DC | PRN
  Administered 2014-08-13: 23.5 mL via INTRADERMAL

## 2014-08-13 MED ORDER — GABAPENTIN 600 MG PO TABS
300.0000 mg | ORAL_TABLET | Freq: Two times a day (BID) | ORAL | Status: DC
  Filled 2014-08-13 (×2): qty 0.5

## 2014-08-13 MED ORDER — SODIUM CHLORIDE 0.9 % IV SOLN
INTRAVENOUS | Status: AC
  Administered 2014-08-13: 04:00:00 via INTRAVENOUS

## 2014-08-13 MED ORDER — VITAMIN B-12 1000 MCG PO TABS
1000.0000 ug | ORAL_TABLET | Freq: Every day | ORAL | Status: DC
  Administered 2014-08-13 – 2014-08-19 (×7): 1000 ug via ORAL
  Filled 2014-08-13 (×7): qty 1

## 2014-08-13 MED ORDER — VANCOMYCIN HCL IN DEXTROSE 1-5 GM/200ML-% IV SOLN
INTRAVENOUS | Status: AC
  Administered 2014-08-13: 1000 mg via INTRAVENOUS
  Filled 2014-08-13: qty 200

## 2014-08-13 MED ORDER — SUCCINYLCHOLINE CHLORIDE 20 MG/ML IJ SOLN
INTRAMUSCULAR | Status: AC
  Filled 2014-08-13: qty 2

## 2014-08-13 MED ORDER — CEFTRIAXONE SODIUM IN DEXTROSE 40 MG/ML IV SOLN
2.0000 g | INTRAVENOUS | Status: DC
  Administered 2014-08-13 – 2014-08-17 (×5): 2 g via INTRAVENOUS
  Filled 2014-08-13 (×6): qty 50

## 2014-08-13 MED ORDER — EPHEDRINE SULFATE 50 MG/ML IJ SOLN
INTRAMUSCULAR | Status: DC | PRN
  Administered 2014-08-13 (×2): 5 mg via INTRAVENOUS

## 2014-08-13 MED ORDER — SODIUM CHLORIDE 0.9 % IV BOLUS (SEPSIS)
1000.0000 mL | Freq: Once | INTRAVENOUS | Status: AC
  Administered 2014-08-13: 1000 mL via INTRAVENOUS

## 2014-08-13 MED ORDER — LIDOCAINE HCL (CARDIAC) 20 MG/ML IV SOLN
INTRAVENOUS | Status: DC | PRN
  Administered 2014-08-13: 50 mg via INTRAVENOUS

## 2014-08-13 MED ORDER — SODIUM CHLORIDE 0.9 % IV SOLN
INTRAVENOUS | Status: DC | PRN
  Administered 2014-08-13: 19:00:00 via INTRAVENOUS

## 2014-08-13 MED ORDER — ROCURONIUM BROMIDE 50 MG/5ML IV SOLN
INTRAVENOUS | Status: AC
  Filled 2014-08-13: qty 2

## 2014-08-13 MED ORDER — PHENYLEPHRINE HCL 10 MG/ML IJ SOLN
INTRAMUSCULAR | Status: AC
  Filled 2014-08-13: qty 1

## 2014-08-13 MED ORDER — PROPOFOL 10 MG/ML IV BOLUS
INTRAVENOUS | Status: AC
  Filled 2014-08-13: qty 20

## 2014-08-13 MED ORDER — LACTATED RINGERS IV SOLN
INTRAVENOUS | Status: DC | PRN
  Administered 2014-08-13: 16:00:00 via INTRAVENOUS

## 2014-08-13 MED ORDER — PROPOFOL 10 MG/ML IV BOLUS
INTRAVENOUS | Status: DC | PRN
  Administered 2014-08-13: 30 mg via INTRAVENOUS
  Administered 2014-08-13: 80 mg via INTRAVENOUS

## 2014-08-13 MED ORDER — ACETAMINOPHEN 325 MG PO TABS
650.0000 mg | ORAL_TABLET | Freq: Four times a day (QID) | ORAL | Status: DC | PRN
  Administered 2014-08-13 (×2): 650 mg via ORAL
  Filled 2014-08-13 (×3): qty 2

## 2014-08-13 MED ORDER — NEOSTIGMINE METHYLSULFATE 10 MG/10ML IV SOLN
INTRAVENOUS | Status: DC | PRN
  Administered 2014-08-13: 3 mg via INTRAVENOUS

## 2014-08-13 MED ORDER — SUCCINYLCHOLINE CHLORIDE 20 MG/ML IJ SOLN
INTRAMUSCULAR | Status: DC | PRN
  Administered 2014-08-13: 100 mg via INTRAVENOUS

## 2014-08-13 MED ORDER — FENTANYL CITRATE 0.05 MG/ML IJ SOLN
INTRAMUSCULAR | Status: DC | PRN
  Administered 2014-08-13 (×5): 50 ug via INTRAVENOUS

## 2014-08-13 MED ORDER — HYDROMORPHONE HCL 1 MG/ML IJ SOLN
0.2500 mg | INTRAMUSCULAR | Status: DC | PRN
Start: 2014-08-13 — End: 2014-08-13
  Administered 2014-08-13 (×2): 0.5 mg via INTRAVENOUS

## 2014-08-13 MED ORDER — SODIUM CHLORIDE 0.9 % IR SOLN
Status: DC | PRN
  Administered 2014-08-13: 500 mL

## 2014-08-13 SURGICAL SUPPLY — 63 items
ADH SKN CLS APL DERMABOND .7 (GAUZE/BANDAGES/DRESSINGS) ×1
APL SKNCLS STERI-STRIP NONHPOA (GAUZE/BANDAGES/DRESSINGS)
BAG DECANTER FOR FLEXI CONT (MISCELLANEOUS) ×2 IMPLANT
BENZOIN TINCTURE PRP APPL 2/3 (GAUZE/BANDAGES/DRESSINGS) IMPLANT
BLADE CLIPPER SURG (BLADE) IMPLANT
BRUSH SCRUB EZ PLAIN DRY (MISCELLANEOUS) ×2 IMPLANT
BUR ACORN 6.0 ACORN (BURR) IMPLANT
BUR ACRON 5.0MM COATED (BURR) IMPLANT
BUR MATCHSTICK NEURO 3.0 LAGG (BURR) ×2 IMPLANT
CANISTER SUCT 3000ML PPV (MISCELLANEOUS) ×2 IMPLANT
CONT SPEC 4OZ CLIKSEAL STRL BL (MISCELLANEOUS) ×2 IMPLANT
DERMABOND ADVANCED (GAUZE/BANDAGES/DRESSINGS) ×1
DERMABOND ADVANCED .7 DNX12 (GAUZE/BANDAGES/DRESSINGS) IMPLANT
DRAPE LAPAROTOMY 100X72X124 (DRAPES) ×2 IMPLANT
DRAPE MICROSCOPE LEICA (MISCELLANEOUS) ×2 IMPLANT
DRAPE POUCH INSTRU U-SHP 10X18 (DRAPES) ×2 IMPLANT
DRSG EMULSION OIL 3X3 NADH (GAUZE/BANDAGES/DRESSINGS) IMPLANT
ELECT REM PT RETURN 9FT ADLT (ELECTROSURGICAL) ×2
ELECTRODE REM PT RTRN 9FT ADLT (ELECTROSURGICAL) ×1 IMPLANT
GAUZE SPONGE 4X4 12PLY STRL (GAUZE/BANDAGES/DRESSINGS) IMPLANT
GAUZE SPONGE 4X4 16PLY XRAY LF (GAUZE/BANDAGES/DRESSINGS) IMPLANT
GLOVE BIO SURGEON STRL SZ 6.5 (GLOVE) ×1 IMPLANT
GLOVE BIO SURGEON STRL SZ7 (GLOVE) ×3 IMPLANT
GLOVE BIOGEL PI IND STRL 8 (GLOVE) ×1 IMPLANT
GLOVE BIOGEL PI INDICATOR 8 (GLOVE) ×1
GLOVE ECLIPSE 7.5 STRL STRAW (GLOVE) ×2 IMPLANT
GLOVE EXAM NITRILE LRG STRL (GLOVE) IMPLANT
GLOVE EXAM NITRILE MD LF STRL (GLOVE) IMPLANT
GLOVE EXAM NITRILE XL STR (GLOVE) IMPLANT
GLOVE EXAM NITRILE XS STR PU (GLOVE) IMPLANT
GOWN STRL REUS W/ TWL LRG LVL3 (GOWN DISPOSABLE) ×1 IMPLANT
GOWN STRL REUS W/ TWL XL LVL3 (GOWN DISPOSABLE) IMPLANT
GOWN STRL REUS W/TWL 2XL LVL3 (GOWN DISPOSABLE) IMPLANT
GOWN STRL REUS W/TWL LRG LVL3 (GOWN DISPOSABLE) ×4
GOWN STRL REUS W/TWL XL LVL3 (GOWN DISPOSABLE)
KIT BASIN OR (CUSTOM PROCEDURE TRAY) ×2 IMPLANT
KIT ROOM TURNOVER OR (KITS) ×2 IMPLANT
LIQUID BAND (GAUZE/BANDAGES/DRESSINGS) IMPLANT
NDL HYPO 18GX1.5 BLUNT FILL (NEEDLE) IMPLANT
NDL SPNL 18GX3.5 QUINCKE PK (NEEDLE) ×1 IMPLANT
NDL SPNL 22GX3.5 QUINCKE BK (NEEDLE) ×1 IMPLANT
NEEDLE HYPO 18GX1.5 BLUNT FILL (NEEDLE) IMPLANT
NEEDLE SPNL 18GX3.5 QUINCKE PK (NEEDLE) ×2 IMPLANT
NEEDLE SPNL 22GX3.5 QUINCKE BK (NEEDLE) ×2 IMPLANT
NS IRRIG 1000ML POUR BTL (IV SOLUTION) ×2 IMPLANT
PACK LAMINECTOMY NEURO (CUSTOM PROCEDURE TRAY) ×2 IMPLANT
PAD ARMBOARD 7.5X6 YLW CONV (MISCELLANEOUS) ×6 IMPLANT
PATTIES SURGICAL .5 X1 (DISPOSABLE) ×2 IMPLANT
RUBBERBAND STERILE (MISCELLANEOUS) ×4 IMPLANT
SPONGE LAP 4X18 X RAY DECT (DISPOSABLE) IMPLANT
SPONGE SURGIFOAM ABS GEL SZ50 (HEMOSTASIS) ×2 IMPLANT
STRIP CLOSURE SKIN 1/2X4 (GAUZE/BANDAGES/DRESSINGS) IMPLANT
SUT PROLENE 6 0 BV (SUTURE) IMPLANT
SUT VIC AB 1 CT1 18XBRD ANBCTR (SUTURE) ×1 IMPLANT
SUT VIC AB 1 CT1 8-18 (SUTURE) ×2
SUT VIC AB 2-0 CP2 18 (SUTURE) ×3 IMPLANT
SUT VIC AB 3-0 SH 8-18 (SUTURE) ×1 IMPLANT
SYR 20ML ECCENTRIC (SYRINGE) ×2 IMPLANT
SYR 5ML LL (SYRINGE) IMPLANT
SYR BULB IRRIGATION 50ML (SYRINGE) ×1 IMPLANT
TOWEL OR 17X24 6PK STRL BLUE (TOWEL DISPOSABLE) ×2 IMPLANT
TOWEL OR 17X26 10 PK STRL BLUE (TOWEL DISPOSABLE) ×2 IMPLANT
WATER STERILE IRR 1000ML POUR (IV SOLUTION) ×2 IMPLANT

## 2014-08-13 NOTE — Consult Note (Signed)
Reason for Consult:  Back pain, paraparesis, suspected discitis/epidural abscess/psoas abscess Referring Physician:  Dr. Paula Compton  Carlos Andrews is an 81 y.o. male.  HPI: Patient admitted last night to the [facility] family practice service because of repeated falls at home. Patient explains that he has fallen several times over the past couple weeks. He's had weakness in his lower extremities for at least a month, and he and his family feel that the weakness has been worsening. He describes pain across low back that extends down into the lower extremities bilaterally.  He denies any fevers.  Admission examination describes 3/5 strength in the lower extremities. Laboratories show an increasing white blood cell count (14.2 on February 29, 16.2 on March 4, 18.0 on March 5).    History is notable for multiple significant medical comorbidities including diabetes mellitus for over 20 years, diabetic peripheral neuropathy, chronic renal insufficiency stage III, hypertension, hyperlipidemia, and peripheral vascular disease. He also has a history of an upper thoracic spinal infection treated at [facility] in 1993.  Patient worked up today with MRI of the lumbar spine without contrast. The study shows extensive multilevel lumbar spondylosis and degenerative disc disease, with degenerative changes seen essentially at every level. However there are other specific abnormalities including increased signal within the L2-3 disc space and increased signal diffusely within the right psoas muscle, suspicious for possible infectious changes including possible discitis and possible psoas abscess/phlegmon. There also appears to be severe stenosis at the L2-3 level contributed to by spondylitic degenerative changes, but also probably by some amount of increased signal material ventrally, possibly inflamed or infected disc material.  Neurosurgical consultation was requested for further  recommendations.  Past Medical History:  Past Medical History  Diagnosis Date  . DIABETES MELLITUS, II, COMPLICATIONS 08/01/9796  . Esophageal perforation 1993    managed at [facility]  . PPD positive 1993    Found at [facility]  . Spinal stenosis of lumbar region at multiple levels   . Proteinuria 08/07/2006    Qualifier: Diagnosis of  By: Drucie Ip    . Open angle with borderline findings and low glaucoma risk in both eyes 02/23/2013  . Regular astigmatism 02/23/2013  . Presbyopia 02/23/2013  . Ptosis of left eyelid 02/23/2013  . At risk for falling 10/04/2013  . GAIT DISTURBANCE 08/01/2008        . HYPERLIPIDEMIA 08/07/2006    Qualifier: Diagnosis of  By: Drucie Ip    . HYPERTENSION, BENIGN SYSTEMIC 08/07/2006    Qualifier: Diagnosis of  By: Drucie Ip    . 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 LUMBAR SPRAIN AND STRAIN 07/19/2008    Qualifier: Diagnosis of  By: Lindell Noe MD, Jeneen Rinks    . Myopia with presbyopia of left eye 02/23/2013  . OBESITY, NOS 08/07/2006    Qualifier: Diagnosis of  By: Drucie Ip    . OSTEOARTHRITIS, LOWER LEG 08/07/2006    Qualifier: Diagnosis of  By: Drucie Ip    . PERIPHERAL EDEMA 08/01/2008    Qualifier: Diagnosis of  By: Walker Kehr MD, Patrick Jupiter    . Peripheral vascular disease, unspecified 04/21/2012    Severe tibial occlusive disease (Dr Kellie Simmering, [facility], 04/2002)     . Recurrent falls 10/04/2013  . Skin ulcer of foot, left heel 02/23/2013  . Type II or unspecified type diabetes mellitus without mention of complication, not stated as uncontrolled 08/07/2006    Qualifier: Diagnosis  of  By: Drucie Ip    . Unspecified hereditary and idiopathic peripheral neuropathy 08/07/2006        . Proteinuria 08/07/2006  . Impairment of balance 05/10/2014  . Urge incontinence 05/10/2014  . Impaired mobility and ADLs 05/10/2014    Past Surgical History:  Past Surgical History  Procedure Laterality Date  . Neck surgery   1993    s/p C1-2, C2-3 anterior cervical diskectomies, partial corpectomies through thoracotomy at [place]  . Cataract extraction      Cataract surgery   . Thoracotomy  1993    s/p T3-T4 osteomyelitis with multiple open drainages including thoracotomy at [place]  . Spinal cord decompression  1993    s/p T1-T2 spinal cord compression secondary to granualtion tissue and abscess material secondary to previous mediastinitis at [place]  . Jejunostomy feeding tube  1993    s/p Feeding jejunostomy [place]  . Esophageal perforation  1993    Hx of esophageal perforation complication 6256  . Lumbar epidural injection  2010    Epidural corticosteroid injection for L2-3 & L4-5 severe spinal stenosis at [place] on 9/10, 10/10, 11/10      Family History:  Family History  Problem Relation Age of Onset  . Diabetes Mother   . Diabetes Father   . Hypertension Father   . Other Father     amputaion  . Diabetes Son   . Hypertension Son     Social History:  reports that he quit smoking about 27 years ago. His smoking use included Cigarettes. He has never used smokeless tobacco. He reports that he does not drink alcohol or use illicit drugs.  Allergies:  Allergies  Allergen Reactions  . Naprosyn [Naproxen] Other (See Comments)    GI upset  . Rofecoxib     REACTION: dizzy  . Tramadol Hcl     REACTION: vomiting    Medications: I have reviewed the patient's current medications.  ROS:  Notable for those difficulties described in his history of present illness and past medical history, also notable for some limitations in voiding, but otherwise unremarkable.  Physical Examination: Elderly black male, in moderate discomfort. He has more acute pain when his position is changed. Blood pressure 99/56, pulse 88, temperature 98.2 F (36.8 C), temperature source Oral, resp. rate 18, SpO2 100 %. Musculoskeletal:  Straight leg raising is negative bilaterally  Neurological  Examination: Motor Examination:  Paraparesis, worse proximally than distally.  Left iliopsoas is 2, right iliopsoas is 2-3. Left quadriceps is 2-3, right quadriceps is 3. Dorsiflexor and plantar flexor are 4+ bilaterally. Sensory Examination:  Somewhat inconsistent exam, but seemingly somewhat decreased sensation to pinprick over the thighs. Reflex Examination:   Absent at the quadriceps and gastrocnemius bilaterally. Gait and Stance Examination:  Not tested due to the nature the patient's condition.   Results for orders placed or performed during the hospital encounter of 08/12/14 (from the past 48 hour(s))  CBC with Differential/Platelet     Status: Abnormal   Collection Time: 08/12/14  4:58 PM  Result Value Ref Range   WBC 16.2 (H) 4.0 - 10.5 K/uL   RBC 4.61 4.22 - 5.81 MIL/uL   Hemoglobin 13.0 13.0 - 17.0 g/dL   HCT 37.9 (L) 39.0 - 52.0 %   MCV 82.2 78.0 - 100.0 fL   MCH 28.2 26.0 - 34.0 pg   MCHC 34.3 30.0 - 36.0 g/dL   RDW 16.8 (H) 11.5 - 15.5 %   Platelets 121 (  L) 150 - 400 K/uL   Neutrophils Relative % 97 (H) 43 - 77 %   Neutro Abs 15.8 (H) 1.7 - 7.7 K/uL   Lymphocytes Relative 2 (L) 12 - 46 %   Lymphs Abs 0.3 (L) 0.7 - 4.0 K/uL   Monocytes Relative 1 (L) 3 - 12 %   Monocytes Absolute 0.1 0.1 - 1.0 K/uL   Eosinophils Relative 0 0 - 5 %   Eosinophils Absolute 0.0 0.0 - 0.7 K/uL   Basophils Relative 0 0 - 1 %   Basophils Absolute 0.0 0.0 - 0.1 K/uL  Comprehensive metabolic panel     Status: Abnormal   Collection Time: 08/12/14  4:58 PM  Result Value Ref Range   Sodium 139 135 - 145 mmol/L   Potassium 4.2 3.5 - 5.1 mmol/L   Chloride 105 96 - 112 mmol/L   CO2 24 19 - 32 mmol/L   Glucose, Bld 350 (H) 70 - 99 mg/dL   BUN 26 (H) 6 - 23 mg/dL   Creatinine, Ser 1.54 (H) 0.50 - 1.35 mg/dL   Calcium 8.5 8.4 - 10.5 mg/dL   Total Protein 5.9 (L) 6.0 - 8.3 g/dL   Albumin 2.4 (L) 3.5 - 5.2 g/dL   AST 22 0 - 37 U/L   ALT 48 0 - 53 U/L   Alkaline Phosphatase 104 39 - 117 U/L    Total Bilirubin 2.4 (H) 0.3 - 1.2 mg/dL   GFR calc non Af Amer 41 (L) >90 mL/min   GFR calc Af Amer 48 (L) >90 mL/min    Comment: (NOTE) The eGFR has been calculated using the CKD EPI equation. This calculation has not been validated in all clinical situations. eGFR's persistently <90 mL/min signify possible Chronic Kidney Disease.    Anion gap 10 5 - 15  Troponin I     Status: Abnormal   Collection Time: 08/12/14  4:58 PM  Result Value Ref Range   Troponin I 0.05 (H) <0.031 ng/mL    Comment:        PERSISTENTLY INCREASED TROPONIN VALUES IN THE RANGE OF 0.04-0.49 ng/mL CAN BE SEEN IN:       -UNSTABLE ANGINA       -CONGESTIVE HEART FAILURE       -MYOCARDITIS       -CHEST TRAUMA       -ARRYHTHMIAS       -LATE PRESENTING MYOCARDIAL INFARCTION       -COPD   CLINICAL FOLLOW-UP RECOMMENDED.   CK     Status: None   Collection Time: 08/12/14  4:58 PM  Result Value Ref Range   Total CK 122 7 - 232 U/L  Urinalysis, Routine w reflex microscopic     Status: Abnormal   Collection Time: 08/12/14  5:35 PM  Result Value Ref Range   Color, Urine YELLOW YELLOW   APPearance CLEAR CLEAR   Specific Gravity, Urine 1.020 1.005 - 1.030   pH 5.0 5.0 - 8.0   Glucose, UA >1000 (A) NEGATIVE mg/dL   Hgb urine dipstick NEGATIVE NEGATIVE   Bilirubin Urine SMALL (A) NEGATIVE   Ketones, ur 15 (A) NEGATIVE mg/dL   Protein, ur 30 (A) NEGATIVE mg/dL   Urobilinogen, UA 1.0 0.0 - 1.0 mg/dL   Nitrite NEGATIVE NEGATIVE   Leukocytes, UA NEGATIVE NEGATIVE  Urine microscopic-add on     Status: Abnormal   Collection Time: 08/12/14  5:35 PM  Result Value Ref Range   Squamous Epithelial / LPF FEW (A)  RARE   WBC, UA 0-2 <3 WBC/hpf   RBC / HPF 0-2 <3 RBC/hpf   Bacteria, UA FEW (A) RARE   Casts GRANULAR CAST (A) NEGATIVE  Troponin I     Status: Abnormal   Collection Time: 08/12/14  9:08 PM  Result Value Ref Range   Troponin I 0.12 (H) <0.031 ng/mL    Comment:        PERSISTENTLY INCREASED TROPONIN VALUES  IN THE RANGE OF 0.04-0.49 ng/mL CAN BE SEEN IN:       -UNSTABLE ANGINA       -CONGESTIVE HEART FAILURE       -MYOCARDITIS       -CHEST TRAUMA       -ARRYHTHMIAS       -LATE PRESENTING MYOCARDIAL INFARCTION       -COPD   CLINICAL FOLLOW-UP RECOMMENDED.   Glucose, capillary     Status: Abnormal   Collection Time: 08/13/14  1:38 AM  Result Value Ref Range   Glucose-Capillary 292 (H) 70 - 99 mg/dL   Comment 1 Notify RN   Basic metabolic panel     Status: Abnormal   Collection Time: 08/13/14  4:23 AM  Result Value Ref Range   Sodium 135 135 - 145 mmol/L   Potassium 3.8 3.5 - 5.1 mmol/L   Chloride 103 96 - 112 mmol/L   CO2 25 19 - 32 mmol/L   Glucose, Bld 334 (H) 70 - 99 mg/dL   BUN 29 (H) 6 - 23 mg/dL   Creatinine, Ser 1.56 (H) 0.50 - 1.35 mg/dL   Calcium 8.0 (L) 8.4 - 10.5 mg/dL   GFR calc non Af Amer 41 (L) >90 mL/min   GFR calc Af Amer 47 (L) >90 mL/min    Comment: (NOTE) The eGFR has been calculated using the CKD EPI equation. This calculation has not been validated in all clinical situations. eGFR's persistently <90 mL/min signify possible Chronic Kidney Disease.    Anion gap 7 5 - 15  Glucose, capillary     Status: Abnormal   Collection Time: 08/13/14  6:25 AM  Result Value Ref Range   Glucose-Capillary 296 (H) 70 - 99 mg/dL   Comment 1 Notify RN   Troponin I     Status: Abnormal   Collection Time: 08/13/14  7:30 AM  Result Value Ref Range   Troponin I 0.14 (H) <0.031 ng/mL    Comment:        PERSISTENTLY INCREASED TROPONIN VALUES IN THE RANGE OF 0.04-0.49 ng/mL CAN BE SEEN IN:       -UNSTABLE ANGINA       -CONGESTIVE HEART FAILURE       -MYOCARDITIS       -CHEST TRAUMA       -ARRYHTHMIAS       -LATE PRESENTING MYOCARDIAL INFARCTION       -COPD   CLINICAL FOLLOW-UP RECOMMENDED.   CBC     Status: Abnormal   Collection Time: 08/13/14  7:30 AM  Result Value Ref Range   WBC 18.0 (H) 4.0 - 10.5 K/uL   RBC 4.28 4.22 - 5.81 MIL/uL   Hemoglobin 11.9 (L) 13.0 -  17.0 g/dL   HCT 34.6 (L) 39.0 - 52.0 %   MCV 80.8 78.0 - 100.0 fL   MCH 27.8 26.0 - 34.0 pg   MCHC 34.4 30.0 - 36.0 g/dL   RDW 16.8 (H) 11.5 - 15.5 %   Platelets 137 (L) 150 - 400 K/uL  Glucose, capillary  Status: Abnormal   Collection Time: 08/13/14 11:56 AM  Result Value Ref Range   Glucose-Capillary 285 (H) 70 - 99 mg/dL    Dg Chest 2 View  08/12/2014   CLINICAL DATA:  Low back pain without trauma.  EXAM: CHEST  2 VIEW  COMPARISON:  None.  FINDINGS: Lateral view degraded by patient arm position. Lower thoracic spondylosis is moderate. Surgical clips which project over the right upper lobe and right lung apex medially. Midline trachea. Marked cardiomegaly with atherosclerosis in the transverse aorta. No pleural effusion or pneumothorax. No congestive failure. Clear lungs.  IMPRESSION: Mild cardiomegaly and aortic atherosclerosis.  No acute findings.   Electronically Signed   By: Abigail Miyamoto M.D.   On: 08/12/2014 20:05   Dg Thoracic Spine 2 View  08/12/2014   CLINICAL DATA:  Thoracic back pain.  EXAM: THORACIC SPINE - 2 VIEW  COMPARISON:  None.  FINDINGS: There is no evidence of thoracic spine fracture. Alignment is normal. Degenerative osteophytosis is seen at multiple levels in the mid lower thoracic spine and the upper lumbar spine. No focal lytic or sclerotic bone lesions identified.  IMPRESSION: No acute findings.  Degenerative spondylosis, as described above.   Electronically Signed   By: Earle Gell M.D.   On: 08/12/2014 20:06   Dg Lumbar Spine Complete  08/12/2014   CLINICAL DATA:  No injury.  Low back pain.  EXAM: LUMBAR SPINE - COMPLETE 4+ VIEW  COMPARISON:  MRI 02/17/2009 lumbar spine  FINDINGS: Five lumbar type vertebral bodies. Mild superior endplate irregularity at L3 is unchanged. Advanced degenerative disc disease and spondylosis. L2 through S1. No malalignment. Advanced facet arthropathy within the lower lumbar spine and lumbosacral junction. No acute vertebral body height  loss. Aortic and branch vessel atherosclerosis.  IMPRESSION: Advanced lumbar spondylosis, without acute superimposed process.   Electronically Signed   By: Abigail Miyamoto M.D.   On: 08/12/2014 20:03   Mr Lumbar Spine Wo Contrast  08/13/2014   ADDENDUM REPORT: 08/13/2014 12:42  ADDENDUM: These results were called by telephone at the time of interpretation on 08/13/2014 at 12:41 pm to Dr. Minda Ditto, who verbally acknowledged these results.   Electronically Signed   By: Richardean Sale M.D.   On: 08/13/2014 12:42   08/13/2014   CLINICAL DATA:  Low back pain for 2 months. No known injury. Remote back surgery. Initial encounter.  EXAM: MRI LUMBAR SPINE WITHOUT CONTRAST  TECHNIQUE: Multiplanar, multisequence MR imaging of the lumbar spine was performed. No intravenous contrast was administered.  COMPARISON:  Radiographs 08/12/2014.  MRI 02/17/2009.  FINDINGS: Radiographs demonstrate no visible ribs at T12 and 5 lumbar type vertebral bodies. This corresponds with the numbering utilized on the prior MRI. The alignment is stable with 4 mm of anterolisthesis at L4-5. There are endplate degenerative changes throughout the lumbar spine which are similar to the prior study. There is increased T2 hyperintensity within the L2-3 disc without gross endplate destruction. Underlying deformity of the superior endplate of L3 appears unchanged.  The conus medullaris extends to the L1 level and appears normal. Extensive tortuosity of the nerve roots is again noted within the upper lumbar spinal canal. There is new ill-defined T2 hyperintensity within the right psoas muscle. No focal fluid collection identified.  T12-L1: Mildly progressive annular disc bulging with mild facet and ligamentous hypertrophy. No spinal stenosis or nerve root encroachment.  L1-2: Disc height and hydration are maintained. No spinal stenosis or nerve root encroachment.  L2-3: Previously, the patient had moderate  to severe multifactorial spinal stenosis at this level.  There is progressive disc degeneration with annular bulging and T2 hyperintensity within the disc as described above. There is resulting near complete effacement of the thecal sac consistent with severe spinal stenosis. Severe foraminal narrowing is present bilaterally with probable bilateral L2 nerve root encroachment. Facet and ligamentous hypertrophy does not appear significantly changed. There is no gross bone destruction.  L3-4: Chronic degenerative disc disease with loss of disc height, annular disc bulging and posterior osteophytes. There is moderate facet and ligamentous hypertrophy. The resulting spinal stenosis appears mildly progressive, moderate in degree. Moderate left greater than right foraminal stenosis appears about the same.  L4-5: Chronic degenerative disc disease with loss of disc height, annular disc bulging and posterior osteophytes. Moderate to severe multifactorial spinal stenosis again noted. At least moderate right greater than left lateral recess and foraminal stenosis is similar to the prior study.  L5-S1: Chronic degenerative disc disease with loss of disc height, annular disc bulging and posterior osteophytes. The resulting mild spinal, lateral recess and foraminal stenosis bilaterally does not appear significantly changed.  IMPRESSION: 1. Compared with the prior MRI from 02/17/2009, there are significantly progressive changes at the L2-3 disc space level with progressive disc bulging and discal T2 hyperintensity. These findings compress the thecal sac, resulting in severe spinal and biforaminal stenosis. Although the discal hyperintensity is new, there is no progressive bone destruction to suggest osteomyelitis. However, there is apparently a history of spinal osteomyelitis, and new T2 hyperintensity is demonstrated within the right psoas muscle such that disc space and paraspinal infection cannot be completely excluded. 2. Chronic spondylosis at L3-4, L4-5 and L5-S1. There is mildly  progressive spinal stenosis at L3-4 without definite acute findings. 3. These results will be called to the ordering clinician or representative by the Radiologist Assistant, and communication documented in the PACS or zVision Dashboard.  Electronically Signed: By: Richardean Sale M.D. On: 08/13/2014 12:07     Assessment/Plan: Patient with repeated falls, with paraparesis on examination, with back pain extending into the lower extremities bilaterally, with MRI revealing extensive multilevel lumbar degenerative disease and spondylosis, but with findings suspicious for L2-3 discitis, right psoas abscess/phlegmon, possible epidural abscess, and severe stenosis at the L2-3 level. Patient has not had fever, but has an elevated white count, that has been steadily rising.  I discussed the situation thoroughly with Dr. Minda Ditto as well as with the patient, his wife, and his son, each from was at his bedside. We discussed the nature of his condition, the complicating aspects of his multiple medical comorbidities, and options for treatment and care ranging from aspiration of the disc space and psoas by interventional radiology with antibiotic therapy versus surgery involving laminectomy and debridement, with obtaining tissue and cultures. Certainly the latter is more definitive and more likely to be effective, but entails greater risks.  I explained the patient and his family the nature the surgical procedure specifically a lumbar laminectomy and debridement, and also discussed risks of surgery included was an infection, bleeding, possibly for transfusion, the risk of neurologic dysfunction, including weakness, numbness, tingling, and pain, and anesthetic risks including myocardial infarction, stroke, pneumonia, and death. We also discussed the need for postoperative treatment including antibiotic therapy and rehabilitation. Their questions regarding this were answered for them. After discussing this thoroughly, the  patient does wish to proceed with surgery.  Dr. Minda Ditto has requested a cardiology consultation which is pending. He is also contacting infectious disease to obtain the recommendation  for antibody therapy to be initiated once tissue and cultures are obtained at surgery. The patient has been made nothing by mouth.  Hosie Spangle, MD 08/13/2014, 2:40 PM

## 2014-08-13 NOTE — Anesthesia Preprocedure Evaluation (Addendum)
Anesthesia Evaluation  Patient identified by MRN, date of birth, ID band Patient awake    Reviewed: Allergy & Precautions, H&P , NPO status , Patient's Chart, lab work & pertinent test results  Airway Mallampati: II  TM Distance: >3 FB Neck ROM: Full    Dental no notable dental hx. (+) Edentulous Upper, Edentulous Lower, Dental Advisory Given   Pulmonary neg pulmonary ROS, former smoker,  breath sounds clear to auscultation  Pulmonary exam normal       Cardiovascular hypertension, Pt. on medications + Peripheral Vascular Disease Rhythm:Regular Rate:Normal     Neuro/Psych Paraparesis negative psych ROS   GI/Hepatic negative GI ROS, Neg liver ROS,   Endo/Other  diabetes, Poorly Controlled, Type 2, Oral Hypoglycemic Agents  Renal/GU Renal InsufficiencyRenal disease  negative genitourinary   Musculoskeletal  (+) Arthritis -,   Abdominal   Peds  Hematology negative hematology ROS (+)   Anesthesia Other Findings   Reproductive/Obstetrics negative OB ROS                            Anesthesia Physical Anesthesia Plan  ASA: III and emergent  Anesthesia Plan: General   Post-op Pain Management:    Induction: Intravenous, Rapid sequence and Cricoid pressure planned  Airway Management Planned: Oral ETT  Additional Equipment:   Intra-op Plan:   Post-operative Plan: Extubation in OR  Informed Consent: I have reviewed the patients History and Physical, chart, labs and discussed the procedure including the risks, benefits and alternatives for the proposed anesthesia with the patient or authorized representative who has indicated his/her understanding and acceptance.   Dental advisory given  Plan Discussed with: CRNA, Anesthesiologist and Surgeon  Anesthesia Plan Comments:        Anesthesia Quick Evaluation

## 2014-08-13 NOTE — Anesthesia Procedure Notes (Signed)
Procedure Name: Intubation Date/Time: 08/13/2014 4:45 PM Performed by: Brien MatesMAHONY, Quanesha Klimaszewski D Pre-anesthesia Checklist: Patient identified, Emergency Drugs available, Suction available, Patient being monitored and Timeout performed Patient Re-evaluated:Patient Re-evaluated prior to inductionOxygen Delivery Method: Circle system utilized Preoxygenation: Pre-oxygenation with 100% oxygen Intubation Type: IV induction, Rapid sequence and Cricoid Pressure applied Laryngoscope Size: Miller and 2 Grade View: Grade I Tube type: Oral Tube size: 7.5 mm Number of attempts: 1 Airway Equipment and Method: Stylet Placement Confirmation: ETT inserted through vocal cords under direct vision,  positive ETCO2 and breath sounds checked- equal and bilateral Secured at: 23 cm Tube secured with: Tape Dental Injury: Teeth and Oropharynx as per pre-operative assessment

## 2014-08-13 NOTE — Progress Notes (Addendum)
ANTIBIOTIC CONSULT NOTE - INITIAL  Pharmacy Consult for Ceftriaxone and Vancomycin Indication: osteomyelitis  Allergies  Allergen Reactions  . Naprosyn [Naproxen] Other (See Comments)    GI upset  . Rofecoxib     REACTION: dizzy  . Tramadol Hcl     REACTION: vomiting    Patient Measurements:    Height = 68 inches Weight = 75 kg  Vital Signs: Temp: 98.2 F (36.8 C) (03/05 1322) Temp Source: Oral (03/05 1322) BP: 99/56 mmHg (03/05 1322) Pulse Rate: 88 (03/05 1322) Intake/Output from previous day: 03/04 0701 - 03/05 0700 In: 1414.6 [I.V.:414.6; IV Piggyback:1000] Out: -  Intake/Output from this shift: Total I/O In: 120 [P.O.:120] Out: 350 [Urine:350]  Labs:  Recent Labs  08/12/14 1658 08/13/14 0423 08/13/14 0730  WBC 16.2*  --  18.0*  HGB 13.0  --  11.9*  PLT 121*  --  137*  CREATININE 1.54* 1.56*  --    Estimated Creatinine Clearance: 37.1 mL/min (by C-G formula based on Cr of 1.56).    Microbiology: Recent Results (from the past 720 hour(s))  Culture, blood (routine x 2)     Status: None (Preliminary result)   Collection Time: 08/13/14  2:05 PM  Result Value Ref Range Status   Specimen Description BLOOD LEFT ARM  Final   Special Requests BOTTLES DRAWN AEROBIC AND ANAEROBIC 10 CC  Final   Culture PENDING  Incomplete   Report Status PENDING  Incomplete  Culture, blood (routine x 2)     Status: None (Preliminary result)   Collection Time: 08/13/14  2:15 PM  Result Value Ref Range Status   Specimen Description BLOOD LEFT HAND  Final   Special Requests BOTTLES DRAWN AEROBIC ONLY 10 CC  Final   Culture PENDING  Incomplete   Report Status PENDING  Incomplete    Medical History: Past Medical History  Diagnosis Date  . DIABETES MELLITUS, II, COMPLICATIONS 08/07/2006  . Esophageal perforation 1993    [facility]  . PPD positive 1993    [facility]  . Spinal stenosis of lumbar region at multiple levels   . Proteinuria 08/07/2006    Qualifier:  Diagnosis of  By: Haydee SalterKivett, Whitney    . Open angle with borderline findings and low glaucoma risk in both eyes 02/23/2013  . Regular astigmatism 02/23/2013  . Presbyopia 02/23/2013  . Ptosis of left eyelid 02/23/2013  . At risk for falling 10/04/2013  . GAIT DISTURBANCE 08/01/2008        . HYPERLIPIDEMIA 08/07/2006    Qualifier: Diagnosis of  By: Haydee SalterKivett, Whitney    . HYPERTENSION, BENIGN SYSTEMIC 08/07/2006    Qualifier: Diagnosis of  By: Haydee SalterKivett, Whitney    . IMPOTENCE, ORGANIC 08/07/2006    Qualifier: Diagnosis of  By: Haydee SalterKivett, Whitney    . LUMBAR SPRAIN AND STRAIN 07/19/2008    Qualifier: Diagnosis of  By: Mauricio PoBreen MD, Fayrene FearingJames    . Myopia with presbyopia of left eye 02/23/2013  . OBESITY, NOS 08/07/2006    Qualifier: Diagnosis of  By: Haydee SalterKivett, Whitney    . OSTEOARTHRITIS, LOWER LEG 08/07/2006    Qualifier: Diagnosis of  By: Haydee SalterKivett, Whitney    . PERIPHERAL EDEMA 08/01/2008    Qualifier: Diagnosis of  By: Sheffield SliderHale MD, Deniece PortelaWayne    . Peripheral vascular disease, unspecified 04/21/2012    Severe tibial occlusive disease (Dr Hart RochesterLawson, Vasc Surgery, 04/2002)     . Recurrent falls 10/04/2013  . Skin ulcer of foot, left heel 02/23/2013  . Type  II or unspecified type diabetes mellitus without mention of complication, not stated as uncontrolled 08/07/2006    Qualifier: Diagnosis of  By: Haydee Salter    . Unspecified hereditary and idiopathic peripheral neuropathy 08/07/2006        . Proteinuria 08/07/2006  . Impairment of balance 05/10/2014  . Urge incontinence 05/10/2014  . Impaired mobility and ADLs 05/10/2014    Medications:  Scheduled:  . [MAR Hold] aspirin EC  81 mg Oral Daily  . [MAR Hold] calcium-vitamin D  1 tablet Oral Daily  . ceFAZolin      . cefTRIAXone (ROCEPHIN)  IV  2 g Intravenous Once  . [MAR Hold] gabapentin  300 mg Oral BID  . [MAR Hold] insulin aspart  0-9 Units Subcutaneous TID WC  . [MAR Hold] lisinopril  20 mg Oral QHS  . [MAR Hold] multivitamin with minerals  1 tablet Oral Daily  . [MAR  Hold] pyridOXINE  100 mg Oral TID  . vancomycin      . [MAR Hold] vitamin B-12  1,000 mcg Oral Daily  . [MAR Hold] vitamin C  500 mg Oral Daily   Assessment: 81 yo male presenting with back pain, paraparesis, suspected discitis/epidural abscess/psoas abscess and increasing.  Plan is for OR.  ID recommends Vancomycin and Rocephin pending culture results.  Goal of Therapy:  Treatment of infection  Plan:  Ceftriaxone 2g IV q24 Vancomycin 1gm IV q24h - first dose now Monitor WBC, renal fxn F/U culture data and plans for abx therapy.  Marisue Humble, PharmD Clinical Pharmacist [facility]  [place], [place].D., BCPS Clinical Pharmacist Pager [phone] 08/13/2014 7:19 PM

## 2014-08-13 NOTE — Clinical Social Work Placement (Addendum)
Clinical Social Work Department CLINICAL SOCIAL WORK PLACEMENT NOTE 08/13/2014  Patient:  Carlos Andrews,Carlos Andrews  Account Number:  [id number] Admit date:  08/12/2014  Clinical Social Worker:  Vivi Barrackrystal Patrick-jefferson, LCSWA  Date/time:  08/13/2014 06:03 PM  Clinical Social Work is seeking post-discharge placement for this patient at the following level of care:   SKILLED NURSING   (*CSW will update this form in Epic as items are completed)   08/13/2014  Patient/family provided with [facility] Department of Clinical Social Work's list of facilities offering this level of care within the geographic area requested by the patient (or if unable, by the patient's family).  08/13/2014  Patient/family informed of their freedom to choose among providers that offer the needed level of care, that participate in Medicare, Medicaid or managed care program needed by the patient, have an available bed and are willing to accept the patient.  08/13/2014  Patient/family informed of [facility]' ownership interest in [facility], as well as of the fact that they are under no obligation to receive care at this facility.  PASARR submitted to EDS on Existing PASARR number received on Existing  FL2 transmitted to all facilities in geographic area requested by pt/family on  08/13/2014 FL2 transmitted to all facilities within larger geographic area on   Patient informed that his/her managed care company has contracts with or will negotiate with  certain facilities, including the following:     Patient/family informed of bed offers received:  08/17/2014 [facility] Physician [facility] and [facility]  [facility] on  08/19/2014 Patient to be transferred to facility by [facility] Patient and family notified of transfer on 08/19/2014 Name of family member notified:  Carlos Andrews (by phone)  The following physician request were entered in  Epic:   Additional Comments:  Vivi Barrackrystal Patrick-Jefferson, LCSWA Weekend Clinical Social Worker [phone]

## 2014-08-13 NOTE — Op Note (Signed)
08/12/2014 - 08/13/2014  6:59 PM  PATIENT:  Carlos SentersHerman R Andrews  81 y.o. male  PRE-OPERATIVE DIAGNOSIS:  lumbar stenosis, lumbar spondylosis, lumbar degenerative disc disease, suspected discitis/epidural abscess  POST-OPERATIVE DIAGNOSIS:  lumbar stenosis, lumbar spondylosis, lumbar degenerative disease, no infection found  PROCEDURE:  Procedure(s):  L2 and L3 decompressive lumbar laminectomy, with microdissection, microsurgical technique, and the operating microscope with intraoperative swab cultures of the ventral epidural space  SURGEON:  Surgeon(s): Hewitt Shortsobert W Nudelman, MD  ANESTHESIA:   general  EBL:  Total I/O In: 620 [P.O.:120; I.V.:500] Out: 450 [Urine:350; Blood:100]  BLOOD ADMINISTERED:none  COUNT: Correct per nursing staff  SPECIMEN:  Source of Specimen:  Swab cultures of ventral epidural space for routine culture, anaerobic culture and stat Gram stain  DICTATION: Antibiotics were held until cultures were done intraoperatively, and then administered by the anesthesia service. Patient was brought to the operating room placed under general endotracheal anesthesia. Patient was turned to a prone position the lumbar region was prepped with Betadine soap and solution and draped in a sterile fashion. The midline was infiltrated with local anesthetic with epinephrine. A localizing x-ray was taken and then a midline incision was made carried down thru the subcutaneous tissue, bipolar cautery and electrocautery were used to maintain hemostasis. Dissection was carried down to the lumbar fascia which was incised bilaterally and the paraspinal muscles were dissected from the spinous process and lamina in a subperiosteal fashion. Another localizing x-ray was taken and the L2 and L3 levels were identified. The operating microscope was draped, and the decompression was performed using microdissection and microsurgical technique. Laminectomy was begun with double-action rongeurs, a high-speed drill, and  Kerrison punches. We found severe canal and lateral recess stenosis. The thickened ligamentum flavum was carefully removed. Dissection was carried laterally to decompress the lateral stenosis taking care to leave the facet complexes intact. No evidence of infection or purulence was encountered as the dorsal lateral decompression was performed. The ventral epidural space was then explored on the right side. The annulus of the L2-3 disc was identified. It was degenerated and bulging, but no evidence of infection, disc herniation, or ventral epidural mass other than the degenerated bulging disc was found. Swab cultures were obtained from the ventral epidural space, and sent for routine culture, anaerobic culture and stat Gram stain. Once the decompression was completed and cultures obtained, hemostasis was established with the use of Surgifoam. Paraspinal muscles, deep fascia, and Scarpa's fascia were closed in separate layers with interrupted 1 and 2-0 undyed Vicryl sutures. The subcutaneous and subcuticular were closed with interrupted inverted 2-0 undyed Vicryl sutures. Skin edges were approximated with Dermabond. There was dressed with sterile gauze and Hypafix.  PLAN OF CARE: Patient to return to 5 [address]. after PACU care  PATIENT DISPOSITION:  PACU - hemodynamically stable.   Delay start of Pharmacological VTE agent (>24hrs) due to surgical blood loss or risk of bleeding:  yes

## 2014-08-13 NOTE — Progress Notes (Signed)
OT Cancellation Note  Patient Details Name: Carlos SentersHerman R Andrews MRN: [id number] DOB: 06/06/1933   Cancelled Treatment:    Reason Eval/Treat Not Completed: Patient not medically ready. Per Neurosurgery note, pt now planning for lumbar laminectomy and debridement. OT will sign off at this time and await new orders after surgery. Thank you for this referral.   Glennon HamiltonMiller, Eyanna Mcgonagle M   Jolea Dolle Marie Allien Melberg, OTR/L Occupational Therapist [phone] (pager)  08/13/2014, 3:40 PM

## 2014-08-13 NOTE — Progress Notes (Signed)
CRITICAL VALUE ALERT  Critical value received:  Lactic Acid 2.3  Date of notification:  08/13/14  Time of notification:  1530  Critical value read back: yes  Nurse who received alert:  Dennard NipBrittany Scott, RN  MD notified (1st page):  SirenFletke, [place].  Time of first page: 3:44pm  MD notified (2nd page):  Time of second page:  Responding MD: Ignacia BayleyFletke, K.  Time MD responded: 3:47pm

## 2014-08-13 NOTE — Clinical Social Work Psychosocial (Signed)
Clinical Social Work Department BRIEF PSYCHOSOCIAL ASSESSMENT 08/13/2014  Patient:  Carlos Andrews, Carlos Andrews     Account Number:  [id number]     Admit date:  08/12/2014  Clinical Social Worker:  Hubert Azure  Date/Time:  08/13/2014 06:05 PM  Referred by:  Physician  Date Referred:  08/13/2014 Referred for  SNF Placement   Other Referral:   Interview type:  Family Other interview type:   CSW met with patient's wife and patient's son who were present at bedside.    PSYCHOSOCIAL DATA Living Status:  WIFE Admitted from facility:   Level of care:   Primary support name:  Carlos Andrews ([phone]) Carlos Andrews. [phone]) Primary support relationship to patient:  FAMILY Degree of support available:   Good    CURRENT CONCERNS Current Concerns  Post-Acute Placement   Other Concerns:    SOCIAL WORK ASSESSMENT / PLAN CSW met with patient's wife and son who were present at bedside as patient was being prepped for procedure and not present in room. CSW introduced self and explained role. CSW explained SNF placement process and discussed d/c plan with both family members. Wife and son are agreeable with SNF placement and currently have no preference. Wife states they will research facilities. CSW acknowledged wife's desire for informed consent prior to making facility decision.   Assessment/plan status:  Other - See comment Other assessment/ plan:   CSW to update FL2 for placement.   Information/referral to community resources:   CSW provided patient's wife with community SNF list.    PATIENT'S/FAMILY'S RESPONSE TO PLAN OF CARE: Patient's wife is agreeable to SNF, but prefers to research facilities before making decision on which one to send him to.    [place], [place] Weekend Clinical Social Worker [phone]

## 2014-08-13 NOTE — Progress Notes (Signed)
Physical Therapy Evaluation Patient Details Name: Carlos Andrews MRN: [id number] DOB: 01/18/1933 Today's Date: 08/13/2014   History of Present Illness  81 yo male with history of frequent falls at home, weakness of legs progressively worsening over past 2 months.  Hx lumbar senosis.  Unable to walk, pain "across back and down legs".  Clinical Impression  Patient just returned from MRI prior to visit, patient states he hurts, but is agreeable to evaluation from PT.  Symptoms consistent with nerve impingement in lumbar region, imaging pending to confirm.  Patient painful with LE movement, worse with hip flexion, decreased with Active Assist motions.  MAX assist to sit at edge of bed, and only able to stand 5-10 seconds before legs give out.  Instructed patient in log roll technique for bed mobility to decrease pain, Instructed in simple, supine spinal exercises to decrease pain, maintain ROM.  Patient diagnosis and treatment still pending, but patient is appropriate to continue with skilled PT services at this time to address pain, decreased transfers and inability to ambulate currently.     Follow Up Recommendations SNF;Supervision/Assistance - 24 hour    Equipment Recommendations   (Assessment ongoing, possibly slideboard?)    Recommendations for Other Services OT consult     Precautions / Restrictions Precautions Precautions: Fall Restrictions Weight Bearing Restrictions: No      Mobility  Bed Mobility Overal bed mobility: Needs Assistance Bed Mobility: Rolling;Sidelying to Sit;Sit to Sidelying Rolling: Min assist;Mod assist (improved with LE's flexed ) Sidelying to sit: Max assist;HOB elevated     Sit to sidelying: Max assist General bed mobility comments: Verbal and tactile cues for log-roll technique.  Transfers Overall transfer level: Needs assistance Equipment used: Rolling walker (2 wheeled) Transfers: Sit to/from [employer] Sit to Stand: Mod assist;Max  assist Stand pivot transfers: Max assist       General transfer comment: Tolerates standing ~5-10 seconds only, legs start shaking and ultimately collapse.  Unable to ambulate on evalation.   Ambulation/Gait                Stairs            Wheelchair Mobility    Modified Rankin (Stroke Patients Only)       Balance Overall balance assessment: Needs assistance Sitting-balance support: Bilateral upper extremity supported Sitting balance-Leahy Scale: Fair     Standing balance support: Bilateral upper extremity supported Standing balance-Leahy Scale: Poor                               Pertinent Vitals/Pain Pain Assessment: 0-10 Pain Score: 9  Pain Location: Across low back, and down both legs (L>R) Pain Descriptors / Indicators: Jabbing;Aching;Shooting Pain Intervention(s): Limited activity within patient's tolerance;Monitored during session;RN gave pain meds during session;Repositioned    Home Living Family/patient expects to be discharged to:: Unsure (Skilled nursing?) Living Arrangements: Spouse/significant other               Additional Comments: Spouse unable to care for patient at this time due to increased falls and pain.    Prior Function Level of Independence: Independent with assistive device(s)         Comments: Has been using RW x 1 yr, weakness progressed x 2 months, frequent falls during transfers, using w/c x 1 week prior to admission at home.  Spouse unable to care for patient at home at this time.     Hand Dominance  Extremity/Trunk Assessment   Upper Extremity Assessment: Defer to OT evaluation;Overall WFL for tasks assessed           Lower Extremity Assessment: Generalized weakness;RLE deficits/detail;LLE deficits/detail RLE Deficits / Details: Pain with exercises, flexion worse vs extension, grossly 2+ to 3/5 LLE Deficits / Details: Pain with exercises, flexion worse vs extension, grossly 2+ to  3/5     Communication   Communication: No difficulties  Cognition Arousal/Alertness: Awake/alert Behavior During Therapy: WFL for tasks assessed/performed;Flat affect                        General Comments      Exercises General Exercises - Lower Extremity Heel Slides: AAROM;Both;10 reps;Supine Hip ABduction/ADduction: AAROM;Both;10 reps;Supine Other Exercises Other Exercises: AAROM B LE hooklying knee rocking for spinal level ROM.      Assessment/Plan    PT Assessment Patient needs continued PT services  PT Diagnosis Difficulty walking;Generalized weakness;Acute pain   PT Problem List Decreased strength;Decreased range of motion;Decreased activity tolerance;Decreased balance;Decreased mobility;Pain  PT Treatment Interventions DME instruction;Gait training;Functional mobility training;Therapeutic activities;Therapeutic exercise;Patient/family education;Wheelchair mobility training   PT Goals (Current goals can be found in the Care Plan section) Acute Rehab PT Goals Patient Stated Goal: Not stated, to go home again with spouse and start walking again. PT Goal Formulation: With patient Time For Goal Achievement: 08/27/14 Potential to Achieve Goals: Good    Frequency Min 2X/week   Barriers to discharge Decreased caregiver support Patient's spouse unable to provide care at this time, multiple falls at home over past week.    Co-evaluation               End of Session Equipment Utilized During Treatment: Gait belt Activity Tolerance: Patient limited by pain Patient left: in bed;with call bell/phone within reach Nurse Communication: Mobility status         Time: 1210-1247 PT Time Calculation (min) (ACUTE ONLY): 37 min   Charges:   PT Evaluation $Initial PT Evaluation Tier I: 1 Procedure PT Treatments $Therapeutic Exercise: 8-22 mins   PT G Codes:        [name] L 08/13/2014, 1:12 PM

## 2014-08-13 NOTE — Progress Notes (Signed)
Spoke with wife, Carlos Andrews. Wife states his weakness has been persistent for the past year or so. He has been ill on and off since 1990 with weakness that would worsen and improve during those times. He has been using a walker until about one week ago and now has a wheelchair. She acknowledged that she is a little confused as she had just woken up. She states she will be in the hospital later on today.  Jacquelin Hawkingalph Kyzer Blowe, MD PGY-2, [facility] 08/13/2014, 8:33 AM

## 2014-08-13 NOTE — Transfer of Care (Signed)
Immediate Anesthesia Transfer of Care Note  Patient: Carlos Andrews  Procedure(s) Performed: Procedure(s): LUMBAR LAMINECTOMY  (N/A)  Patient Location: PACU  Anesthesia Type:General  Level of Consciousness: sedated  Airway & Oxygen Therapy: Patient Spontanous Breathing and Patient connected to face mask oxygen  Post-op Assessment: Report given to RN and Post -op Vital signs reviewed and stable  Post vital signs: Reviewed and stable  Last Vitals:  Filed Vitals:   08/13/14 1322  BP: 99/56  Pulse: 88  Temp: 36.8 C  Resp: 18    Complications: No apparent anesthesia complications

## 2014-08-13 NOTE — Progress Notes (Addendum)
S: Pt. Complaining of weakness and pain. MRI results received this am with evidence of moderate - severe central canal stenosis as well as evidence of possible infection. Pt. With lower extremity weakness and decreased rectal tone as well as concern for urinary retention by MR imaging. Neurosurgery called (Dr. Newell CoralNudelman) who went with me to see the patient, and explained the findings and our concerns in detail to the patient. He explained the risks and benefits of surgical procedure, and the patient elected to undergo surgery for decompression and tissue sampling.   Prior to my arrival in the room, it was noted that the patient had slightly elevated troponins with some slight T-wave inversion in V5-V6. Repeat EKG was obtained and was noted to have some slightly worsening up-sloping T-waves in V-1, though contiguous changes were not evident. I asked the patient if he had any chest pain or shortness of breath to which he responded "No." On my previous discussion with him, he had not had arm pain, neck, jaw pain, or nausea / vomiting. I made the patient and Dr. Newell CoralNudelman aware of these changes, and that we would discuss with Cardiology prior to surgery.  The patient elected for surgical procedure after receiving an explanation of all of the risks and benefits, and after the above conversation in the room between him, myself, and Dr. Newell CoralNudelman.   O:  Filed Vitals:   08/13/14 1322  BP: 99/56  Pulse: 88  Temp: 98.2 F (36.8 C)  Resp: 18   Exam per previous resident's note.    A/P:  Mr. Carlos Andrews is a 81 y/o gentleman admitted with lower extremity weakness who is now known to have moderate to severe central cord compression of his lumbar spine in the setting of MRI changes in the lumbar L2-3 disk as well as right psoas area concerning for osteomyelitis / abscess.   1. Elevated Troponin - we curbsided cardiology given the imminent nature of surgery for this patient. Cardiology reviewed the patient's laboratory  values and EKG with us, and noted that the EKG actually demonstrated limb lead reversal which was responsible for the above noted changes. He felt that given that the patient has CKD III, and has not had any chest pain, or other angina equivalents, he likely has baseline elvated troponins. They felt he would be safe for surgery, and was likely not having any cardiac ischemic changes at this time. They are continuing to review his chart with us and follow from afar.  - No angina at this time, will hold off on cycling troponins.  - Repeat EKG ASAP.  - Follow clinically.   2. Infectious Disease - Discussed the case with Dr. Luciana Axeomer from Infectious Disease specifically regarding empiric antibiotic therapy after surgery while waiting for culture results. His recommendations are below.  - Vancomycin dosing per pharmacy given renal disease.  - Ceftriaxone 2G daily  - Further narrowing and extent of Abx based on culture results.  - Sed rate pending.  - Blood Cx, Urine Cx pending - Lactic acid mildly elevated 2.3.  - Infectious disease will fully consult once patient is s/p surgery and we have tissue sample. Empiric coverage for now.   3. Neurosurgery - After discussion of risks and benefits of surgery, pt. Has elected for decompression laminectomy with tissue sampling for culture as well.  - Operation per neurosurgery.  - Continue to follow their recommendations.  - NPO

## 2014-08-13 NOTE — Progress Notes (Signed)
Family Medicine Teaching Service Daily Progress Note Intern Pager: [phone]  Patient name: Carlos Andrews Medical record number: [id number] Date of birth: 03/07/1933 Age: 81 y.o. Gender: male  Primary Care Provider: MCDIARMID,TODD D, MD Consultants: None Code Status: DNR  Pt Overview and Major Events to Date:  3/4: patient admitted  Assessment and Plan: Carlos Andrews is a 81 y.o. male presenting with bilateral lower extremity weakness. PMH is significant for T2DM, HTN, and recurrent falls.  Lower extremity weakness: persistent. Patient not ambulatory at all. Uses wheelchair at home. - PT/OT consulted - CSW consulted - fall precautions  CKD stage 3: Unsure of baseline but last Cr 1.2 in 2014. On admission 1.54. Elevation possibly due to volume depletion vs new baseline. UA with >1000 glucose and ketones no acidosis to suggest DKA at this time. S/p fluid bolus and MIVF. Creatinine stable - encourage PO intake - BMET in morning  - avoid nephrotoxic agents  Back Pain: DG lumbar and thoracic significant for degenerative spondylosis. Chronic pain. Hx of spinal stenosis of lumbar region. -Tylenol prn for pain  Hypocalcemia: corrected to 10.1 due to low albumin  HTN: Normotensive. -Continue home Lisinopril.  T2DM: Last A1c 5.4 (09/2013). Patient takes Amaryl at home. Fasting CBG of 296 on 3/5 - Will recheck A1c - Holding Amaryl at this time  - Sensitive SSI  Leukocytosis: Mild. No symptoms to suggest infection. Urinalysis not suggestive of infection. - repeat CBC  History of left heel pressure ulcer - heel pads  Elevated troponin: no chest pain. Heart score: 3. EKG with non-specific t-wave changes that are unchanged from EKG seven days ago. - Troponin in AM - EKG in AM  FEN/GI: Carb modified diet/ mIVF @125  NS Prophylaxis: Hep Subq  Disposition: pending PT/OT eval and volume repletion  Subjective:  Slight complaints of back pain overnight. Tylenol helping with  pain.  Objective: Temp:  [98 F (36.7 C)-98.5 F (36.9 C)] 98.2 F (36.8 C) (03/05 0544) Pulse Rate:  [89-98] 89 (03/05 0544) Resp:  [15-28] 20 (03/05 0544) BP: (105-150)/(52-81) 132/68 mmHg (03/05 0544) SpO2:  [97 %-100 %] 99 % (03/05 0544)  Physical Exam: General: Fair appearing, appears chronically ill Cardiovascular: Regular rate and rhythm, no murmur Respiratory: Clear to auscultation bilaterally Abdomen: Soft, non-tender, non-distended, reducible midline hernia Extremities: Trace edema Neuro: Persistent weakness, 2+ patellar reflexes, downward babinski  Laboratory:  Recent Labs Lab 08/08/14 1708 08/12/14 1658  WBC 14.2* 16.2*  HGB 13.4 13.0  HCT 39.5 37.9*  PLT 72* 121*    Recent Labs Lab 08/08/14 1708 08/12/14 1658 08/13/14 0423  NA 138 139 135  K 4.1 4.2 3.8  CL 105 105 103  CO2 26 24 25   BUN 42* 26* 29*  CREATININE 1.64* 1.54* 1.56*  CALCIUM 9.0 8.5 8.0*  PROT 6.6 5.9*  --   BILITOT 2.9* 2.4*  --   ALKPHOS 108 104  --   ALT 104* 48  --   AST 61* 22  --   GLUCOSE 219* 350* 334*     Imaging/Diagnostic Tests: Dg Chest 2 View  08/12/2014   CLINICAL DATA:  Low back pain without trauma.  EXAM: CHEST  2 VIEW  COMPARISON:  None.  FINDINGS: Lateral view degraded by patient arm position. Lower thoracic spondylosis is moderate. Surgical clips which project over the right upper lobe and right lung apex medially. Midline trachea. Marked cardiomegaly with atherosclerosis in the transverse aorta. No pleural effusion or pneumothorax. No congestive failure. Clear lungs.  IMPRESSION:  Mild cardiomegaly and aortic atherosclerosis.  No acute findings.   Electronically Signed   By: Jeronimo Greaves M.D.   On: 08/12/2014 20:05   Dg Thoracic Spine 2 View  08/12/2014   CLINICAL DATA:  Thoracic back pain.  EXAM: THORACIC SPINE - 2 VIEW  COMPARISON:  None.  FINDINGS: There is no evidence of thoracic spine fracture. Alignment is normal. Degenerative osteophytosis is seen at multiple  levels in the mid lower thoracic spine and the upper lumbar spine. No focal lytic or sclerotic bone lesions identified.  IMPRESSION: No acute findings.  Degenerative spondylosis, as described above.   Electronically Signed   By: Myles Rosenthal M.D.   On: 08/12/2014 20:06   Dg Lumbar Spine Complete  08/12/2014   CLINICAL DATA:  No injury.  Low back pain.  EXAM: LUMBAR SPINE - COMPLETE 4+ VIEW  COMPARISON:  MRI 02/17/2009 lumbar spine  FINDINGS: Five lumbar type vertebral bodies. Mild superior endplate irregularity at L3 is unchanged. Advanced degenerative disc disease and spondylosis. L2 through S1. No malalignment. Advanced facet arthropathy within the lower lumbar spine and lumbosacral junction. No acute vertebral body height loss. Aortic and branch vessel atherosclerosis.  IMPRESSION: Advanced lumbar spondylosis, without acute superimposed process.   Electronically Signed   By: Jeronimo Greaves M.D.   On: 08/12/2014 20:03    Jacquelin Hawking, MD 08/13/2014, 6:37 AM PGY-2, [facility] FPTS Intern pager: [phone], text pages welcome

## 2014-08-14 DIAGNOSIS — Z9889 Other specified postprocedural states: Secondary | ICD-10-CM | POA: Insufficient documentation

## 2014-08-14 LAB — GLUCOSE, CAPILLARY
GLUCOSE-CAPILLARY: 235 mg/dL — AB (ref 70–99)
GLUCOSE-CAPILLARY: 212 mg/dL — AB (ref 70–99)
Glucose-Capillary: 268 mg/dL — ABNORMAL HIGH (ref 70–99)
Glucose-Capillary: 219 mg/dL — ABNORMAL HIGH (ref 70–99)

## 2014-08-14 LAB — CBC
HCT: 33 % — ABNORMAL LOW (ref 39.0–52.0)
HEMOGLOBIN: 11.3 g/dL — AB (ref 13.0–17.0)
MCH: 27.7 pg (ref 26.0–34.0)
MCHC: 34.2 g/dL (ref 30.0–36.0)
MCV: 80.9 fL (ref 78.0–100.0)
PLATELETS: 135 10*3/uL — AB (ref 150–400)
RBC: 4.08 MIL/uL — AB (ref 4.22–5.81)
RDW: 17.2 % — ABNORMAL HIGH (ref 11.5–15.5)
WBC: 12.1 10*3/uL — ABNORMAL HIGH (ref 4.0–10.5)

## 2014-08-14 LAB — BASIC METABOLIC PANEL
Anion gap: 7 (ref 5–15)
BUN: 30 mg/dL — ABNORMAL HIGH (ref 6–23)
CALCIUM: 7.9 mg/dL — AB (ref 8.4–10.5)
CHLORIDE: 105 mmol/L (ref 96–112)
CO2: 26 mmol/L (ref 19–32)
Creatinine, Ser: 1.4 mg/dL — ABNORMAL HIGH (ref 0.50–1.35)
GFR, EST AFRICAN AMERICAN: 54 mL/min — AB (ref >90)
GFR, EST NON AFRICAN AMERICAN: 46 mL/min — AB (ref >90)
Glucose, Bld: 259 mg/dL — ABNORMAL HIGH (ref 70–99)
Potassium: 3.9 mmol/L (ref 3.5–5.1)
SODIUM: 138 mmol/L (ref 135–145)

## 2014-08-14 MED ORDER — MORPHINE SULFATE 2 MG/ML IJ SOLN
2.0000 mg | INTRAMUSCULAR | Status: DC | PRN
  Administered 2014-08-14 – 2014-08-18 (×10): 2 mg via INTRAVENOUS
  Filled 2014-08-14 (×11): qty 1

## 2014-08-14 NOTE — Progress Notes (Signed)
Physical Therapy Treatment Patient Details Name: Carlos Andrews MRN: [id number] DOB: 03/16/1933 Today's Date: 08/14/2014    History of Present Illness 81 yo male with history of frequent falls at home, weakness of legs progressively worsening over past 2 months.  Hx lumbar senosis.  Unable to walk, pain "across back and down legs". s/p lumbar laminectomy    PT Comments    Noting a decline in function compared to last session, with pt unable to general enough proximal LE muscle force for lift; dependent transfer to chair with bilateral knees blocked  Follow Up Recommendations  SNF;Supervision/Assistance - 24 hour     Equipment Recommendations  Other (comment);Wheelchair (measurements PT);Wheelchair cushion (measurements PT) (sliding board; drop-arm BSC)    Recommendations for Other Services OT consult     Precautions / Restrictions Precautions Precautions: Fall    Mobility  Bed Mobility Overal bed mobility: Needs Assistance Bed Mobility: Rolling;Sidelying to Sit Rolling: Mod assist Sidelying to sit: Max assist;HOB elevated       General bed mobility comments: Verbal and tactile cues for log-roll technique. Support given at L shoulder girdle and R pelvic girdle during transition sidelie to sit  Transfers Overall transfer level: Needs assistance   Transfers: Squat Pivot Transfers Sit to Stand: Max assist;+2 physical assistance   Squat pivot transfers: +2 physical assistance;Max assist     General transfer comment: REquired bil knees blocked and used bed pad to support hips while transitioning to chair  Ambulation/Gait                 Stairs            Wheelchair Mobility    Modified Rankin (Stroke Patients Only)       Balance Overall balance assessment: Needs assistance Sitting-balance support: Bilateral upper extremity supported Sitting balance-Leahy Scale: Poor Sitting balance - Comments: REquired close guard and assist to maintain balance;  cues for hand placement and to hold balance Postural control:  (generally unsteady; no gross tendency to lose balance in any particular direction)   Standing balance-Leahy Scale: Zero                      Cognition Arousal/Alertness: Awake/alert Behavior During Therapy: WFL for tasks assessed/performed;Flat affect Overall Cognitive Status: Within Functional Limits for tasks assessed                      Exercises      General Comments        Pertinent Vitals/Pain Pain Assessment: Faces Faces Pain Scale: Hurts little more Pain Location: Low back; once in recliner stated pain was "a little"  Pain Descriptors / Indicators: Aching Pain Intervention(s): Monitored during session;Repositioned    Home Living                      Prior Function            PT Goals (current goals can now be found in the care plan section) Acute Rehab PT Goals Patient Stated Goal: Not stated, to go home again with spouse and start walking again. PT Goal Formulation: With patient Time For Goal Achievement: 08/27/14 Potential to Achieve Goals: Good Progress towards PT goals: Not progressing toward goals - comment    Frequency  Min 2X/week    PT Plan Current plan remains appropriate    Co-evaluation             End of Session Equipment Utilized During  Treatment: Other (comment) (bed pad suporting hips) Activity Tolerance: Patient tolerated treatment well Patient left: in chair;with call bell/phone within reach     Time: 1032-1051 PT Time Calculation (min) (ACUTE ONLY): 19 min  Charges:  $Therapeutic Activity: 8-22 mins                    G Codes:      Van Clines Hamff 08/14/2014, 11:55 AM  Van Clines, PT  Acute Rehabilitation Services Pager [phone] Office [phone]

## 2014-08-14 NOTE — Progress Notes (Signed)
Patient slept throughout night. Patient refused all pain medications when offered. Nursing will continue to monitor

## 2014-08-14 NOTE — Anesthesia Postprocedure Evaluation (Signed)
  Anesthesia Post-op Note  Patient: Loran SentersHerman R Roane  Procedure(s) Performed: Procedure(s): LUMBAR LAMINECTOMY  (N/A)  Patient Location: PACU  Anesthesia Type:General  Level of Consciousness: awake and alert   Airway and Oxygen Therapy: Patient Spontanous Breathing  Post-op Pain: none  Post-op Assessment: Post-op Vital signs reviewed, Patient's Cardiovascular Status Stable and Respiratory Function Stable  Post-op Vital Signs: Reviewed  Filed Vitals:   08/14/14 0152  BP: 105/53  Pulse: 95  Temp: 36.9 C  Resp: 16    Complications: No apparent anesthesia complications

## 2014-08-14 NOTE — Progress Notes (Signed)
Subjective: Patient resting in bed, continues to complain of significant back pain as well as pain into the lower extremities.  Sedimentation rate yesterday only moderately elevated (51). White blood cell count this morning improved (12.1). Continues on vancomycin and ceftriaxone. Gram stain from swab of epidural space showed no organisms. Blood cultures show no growth so far. Cultures from epidural space show no growth so far.  No evidence of infection or purulence found in epidural space. However there may well be infection within the right psoas muscle and L2-3 disc. The infection within the L2-3 disc may have lead to loss of L2-3 disc space height, exacerbating the stenosis at the L2-3 level, leading to the paraparesis, however the continued back and bilateral lower extremity pain may well be arising from the possible discitis and psoas infection. Favor continued antibiotic therapy and infectious disease evaluation.  Objective: Vital signs in last 24 hours: Filed Vitals:   08/14/14 0007 08/14/14 0057 08/14/14 0152 08/14/14 0548  BP: 106/53 97/50 105/53 106/59  Pulse: 92 86 95 90  Temp: 98 F (36.7 C) 97.9 F (36.6 C) 98.5 F (36.9 C) 98.5 F (36.9 C)  TempSrc: Oral Oral Oral Oral  Resp: SpO2: 97% 98% 99% 98%    Intake/Output from previous day: 03/05 0701 - 03/06 0700 In: 1620 [P.O.:120; I.V.:1500] Out: 1050 [Urine:950; Blood:100] Intake/Output this shift: Total I/O In: 120 [P.O.:120] Out: -   Physical Exam:  Dressing clean and dry. Significant paraparesis persists. Able to lift knees slightly from the bed. Iliopsoas 2 bilaterally, quadriceps 2-3 bilaterally, dorsiflexor 2-3 bilaterally, plantar flexor 4 bilaterally.  CBC  Recent Labs  08/13/14 0730 08/14/14 0745  WBC 18.0* 12.1*  HGB 11.9* 11.3*  HCT 34.6* 33.0*  PLT 137* 135*   BMET  Recent Labs  08/13/14 0423 08/14/14 0745  NA 135 138  K 3.8 3.9  CL 103 105  CO2 25 26  GLUCOSE 334* 259*  BUN  29* 30*  CREATININE 1.56* 1.40*  CALCIUM 8.0* 7.9*    Studies/Results: Dg Chest 2 View  08/12/2014   CLINICAL DATA:  Low back pain without trauma.  EXAM: CHEST  2 VIEW  COMPARISON:  None.  FINDINGS: Lateral view degraded by patient arm position. Lower thoracic spondylosis is moderate. Surgical clips which project over the right upper lobe and right lung apex medially. Midline trachea. Marked cardiomegaly with atherosclerosis in the transverse aorta. No pleural effusion or pneumothorax. No congestive failure. Clear lungs.  IMPRESSION: Mild cardiomegaly and aortic atherosclerosis.  No acute findings.   Electronically Signed   By: Jeronimo Greaves M.D.   On: 08/12/2014 20:05   Dg Thoracic Spine 2 View  08/12/2014   CLINICAL DATA:  Thoracic back pain.  EXAM: THORACIC SPINE - 2 VIEW  COMPARISON:  None.  FINDINGS: There is no evidence of thoracic spine fracture. Alignment is normal. Degenerative osteophytosis is seen at multiple levels in the mid lower thoracic spine and the upper lumbar spine. No focal lytic or sclerotic bone lesions identified.  IMPRESSION: No acute findings.  Degenerative spondylosis, as described above.   Electronically Signed   By: Myles Rosenthal M.D.   On: 08/12/2014 20:06   Dg Lumbar Spine 2-3 Views  08/13/2014   CLINICAL DATA:  L2-3 lumbar laminectomy.  EXAM: LUMBAR SPINE - 2-3 VIEW  COMPARISON:  08/13/2014 MR and prior studies  FINDINGS: Lateral intraoperative views of the lumbar spine are submitted postoperatively for interpretation.  The same numbering system will be  utilized as on the recent MR.  Film 1 demonstrates 2 posterior metallic probes, the first directed at the T12-L1 interspace an the second directed at the L2 vertebral body.  Film 2 demonstrates a posterior metallic probe in between the L2 and L3 spinous processes.  Film 3 demonstrates a posterior metallic probe directed at the L2-3 interspace.  IMPRESSION: Industrial/product designer Signed   By: Harmon Pier M.D.   On:  08/13/2014 19:13   Dg Lumbar Spine Complete  08/12/2014   CLINICAL DATA:  No injury.  Low back pain.  EXAM: LUMBAR SPINE - COMPLETE 4+ VIEW  COMPARISON:  MRI 02/17/2009 lumbar spine  FINDINGS: Five lumbar type vertebral bodies. Mild superior endplate irregularity at L3 is unchanged. Advanced degenerative disc disease and spondylosis. L2 through S1. No malalignment. Advanced facet arthropathy within the lower lumbar spine and lumbosacral junction. No acute vertebral body height loss. Aortic and branch vessel atherosclerosis.  IMPRESSION: Advanced lumbar spondylosis, without acute superimposed process.   Electronically Signed   By: Jeronimo Greaves M.D.   On: 08/12/2014 20:03   Mr Thoracic Spine Wo Contrast  08/13/2014   CLINICAL DATA:  Lower extremity weakness. Multiple falls. Lower back and lower extremity pain. Remote history of upper thoracic spinal infection.  EXAM: MRI THORACIC SPINE WITHOUT CONTRAST  TECHNIQUE: Multiplanar, multisequence MR imaging of the thoracic spine was performed. No intravenous contrast was administered.  COMPARISON:  Thoracic spine radiographs 08/12/2014  FINDINGS: The examination had to be discontinued prior to completion as the patient is urgently going to the operating room for lumbar surgery. Axial merge images of the lower thoracic spine were not obtained. Images are mildly degraded by motion artifact.  Limited imaging of the cervical spine for localization purposes demonstrates multilevel spondylosis with large anterior osteophytes at C3-4 and likely multilevel intervertebral osseous fusion in the mid and lower cervical spine.  There is no significant listhesis in the thoracic spine. There appears to be intervertebral osseous fusion from T1-T4. The T3 vertebral body appears small, likely reflecting prior erosion given remote history of osteomyelitis. There may be involvement of the T2 and T4 vertebral bodies as well. Thoracic lumbar vertebral body heights are preserved elsewhere.  Vertebral bone marrow signal is moderately diminished diffusely. No marrow or disc space signal suggestive of active infection is identified.  Thoracic spinal cord is normal in signal. There is mild spinal stenosis at C7-T1 due to disc bulging and infolding of the ligamentum flavum. There is mild disc bulging at T6-7 greater than T9-10 and T10-11 without significant spinal stenosis. Disc bulging and mild facet hypertrophy at T12-L1 result in borderline spinal stenosis and mild bilateral neural foraminal stenosis. There is also mild right neural foraminal stenosis at T10-11.  The thyroid is incompletely imaged but appears diffusely enlarged with several small nodules noted. Dilated, fluid-filled structure in the posterior mediastinum may reflect a gastric pull-through or patulous esophagus.  IMPRESSION: 1. No evidence of active infection in the thoracic spine. 2. Appearance of the upper thoracic spine as above likely reflecting remote history of osteomyelitis and fusion. 3. Mild thoracic spondylosis without evidence of significant stenosis. 4. Gastric pull-through versus patulous thoracic esophagus.   Electronically Signed   By: Sebastian Ache   On: 08/13/2014 15:56   Mr Lumbar Spine Wo Contrast  08/13/2014   ADDENDUM REPORT: 08/13/2014 12:42  ADDENDUM: These results were called by telephone at the time of interpretation on 08/13/2014 at 12:41 pm to Dr. Jaquita Rector, who verbally acknowledged these results.  Electronically Signed   By: Carey Bullocks M.D.   On: 08/13/2014 12:42   08/13/2014   CLINICAL DATA:  Low back pain for 2 months. No known injury. Remote back surgery. Initial encounter.  EXAM: MRI LUMBAR SPINE WITHOUT CONTRAST  TECHNIQUE: Multiplanar, multisequence MR imaging of the lumbar spine was performed. No intravenous contrast was administered.  COMPARISON:  Radiographs 08/12/2014.  MRI 02/17/2009.  FINDINGS: Radiographs demonstrate no visible ribs at T12 and 5 lumbar type vertebral bodies. This corresponds  with the numbering utilized on the prior MRI. The alignment is stable with 4 mm of anterolisthesis at L4-5. There are endplate degenerative changes throughout the lumbar spine which are similar to the prior study. There is increased T2 hyperintensity within the L2-3 disc without gross endplate destruction. Underlying deformity of the superior endplate of L3 appears unchanged.  The conus medullaris extends to the L1 level and appears normal. Extensive tortuosity of the nerve roots is again noted within the upper lumbar spinal canal. There is new ill-defined T2 hyperintensity within the right psoas muscle. No focal fluid collection identified.  T12-L1: Mildly progressive annular disc bulging with mild facet and ligamentous hypertrophy. No spinal stenosis or nerve root encroachment.  L1-2: Disc height and hydration are maintained. No spinal stenosis or nerve root encroachment.  L2-3: Previously, the patient had moderate to severe multifactorial spinal stenosis at this level. There is progressive disc degeneration with annular bulging and T2 hyperintensity within the disc as described above. There is resulting near complete effacement of the thecal sac consistent with severe spinal stenosis. Severe foraminal narrowing is present bilaterally with probable bilateral L2 nerve root encroachment. Facet and ligamentous hypertrophy does not appear significantly changed. There is no gross bone destruction.  L3-4: Chronic degenerative disc disease with loss of disc height, annular disc bulging and posterior osteophytes. There is moderate facet and ligamentous hypertrophy. The resulting spinal stenosis appears mildly progressive, moderate in degree. Moderate left greater than right foraminal stenosis appears about the same.  L4-5: Chronic degenerative disc disease with loss of disc height, annular disc bulging and posterior osteophytes. Moderate to severe multifactorial spinal stenosis again noted. At least moderate right greater  than left lateral recess and foraminal stenosis is similar to the prior study.  L5-S1: Chronic degenerative disc disease with loss of disc height, annular disc bulging and posterior osteophytes. The resulting mild spinal, lateral recess and foraminal stenosis bilaterally does not appear significantly changed.  IMPRESSION: 1. Compared with the prior MRI from 02/17/2009, there are significantly progressive changes at the L2-3 disc space level with progressive disc bulging and discal T2 hyperintensity. These findings compress the thecal sac, resulting in severe spinal and biforaminal stenosis. Although the discal hyperintensity is new, there is no progressive bone destruction to suggest osteomyelitis. However, there is apparently a history of spinal osteomyelitis, and new T2 hyperintensity is demonstrated within the right psoas muscle such that disc space and paraspinal infection cannot be completely excluded. 2. Chronic spondylosis at L3-4, L4-5 and L5-S1. There is mildly progressive spinal stenosis at L3-4 without definite acute findings. 3. These results will be called to the ordering clinician or representative by the Radiologist Assistant, and communication documented in the PACS or zVision Dashboard.  Electronically Signed: By: Carey Bullocks M.D. On: 08/13/2014 12:07    Assessment/Plan: Significant continued pain despite decompression of severe stenosis at the L2-3 level. Will need continued evaluation of infectious status. Spoke with PT and patient's nurse about resuming PT and OT. No restrictions or limitations  from a neurosurgical perspective.  Hewitt ShortsNUDELMAN,ROBERT W, MD 08/14/2014, 10:16 AM

## 2014-08-14 NOTE — Clinical Social Work Note (Signed)
CSW met with patient to provide bed offers. Patient states he prefers for CSW to provide bed offers to wife. CSW to follow.  [place], [place] Weekend Clinical Social Worker [phone]

## 2014-08-14 NOTE — Progress Notes (Signed)
Family Medicine Teaching Service Daily Progress Note Intern Pager: [phone]  Patient name: Carlos Andrews Medical record number: [id number] Date of birth: 02/24/1933 Age: 81 y.o. Gender: male  Primary Care Provider: MCDIARMID,TODD D, MD Consultants: None Code Status: DNR  Pt Overview and Major Events to Date:  3/4: Patient admitted for back pain and weakness 3/5: L2-L3 Laminectomy performed   Assessment and Plan: Carlos Andrews is a 81 y.o. male presenting with bilateral lower extremity weakness. PMH is significant for T2DM, HTN, and recurrent falls.  Lower extremity weakness: Persistent. Patient not ambulatory at all. Uses wheelchair at home. - PT/OT consulted - recommend SNF - CSW consulted - fall precautions  CKD stage 3: Unsure of baseline but last Cr 1.2 in 2014. On admission 1.54. Elevation possibly due to volume depletion vs new baseline. UA with >1000 glucose and ketones no acidosis to suggest DKA at this time. S/p fluid bolus and MIVF. Creatinine stable - encourage PO intake - trend BMET - avoid nephrotoxic agents  Back Pain: DG lumbar and thoracic significant for degenerative spondylosis. Chronic pain. Hx of spinal stenosis of lumbar region. -neurosurgery was consulted after MRI showing moderate to severe lumbar stenosis with suspected infection -now s/p lumbar laminectomy -Tylenol and percocet prn for pain  Hypocalcemia: corrected to 10.1 due to low albumin  HTN: Low BPs. -Will hold home Lisinopril.  T2DM: Last A1c 5.4 (09/2013). Patient takes Amaryl at home. Fasting CBG of 296 on 3/5. - A1c pending - Holding Amaryl at this time  - Sensitive SSI  Leukocytosis: On admission 16.2 >18.0>12.1. Urinalysis not suggestive of infection. Lactic acid 2.3. Remains afebrile. Possible source of infection lumbar spune. Hx of spinal osteomyelitis.    - trend CBC -f/u blood, wound, and urine cultures  -continue antibiotics vancomycin and ceftriaxone  History of left heel  pressure ulcer - heel pads  Elevated troponin: no chest pain. Heart score: 3. EKG with non-specific t-wave changes that are unchanged from EKG seven days ago. - elevated troponins - cardiology believes this to be baseline for patient - EKG with sinus rhythm  FEN/GI: Carb modified diet/ SLIV Prophylaxis: Hep Subq  Disposition: Pending neurosurgery and placement  Subjective:  Complaints of dull back pain. He does not think it is any better than yesterday. Pain medication only slightly helping but states he is still in pain.   Objective: Temp:  [97.7 F (36.5 C)-98.5 F (36.9 C)] 98.5 F (36.9 C) (03/06 0548) Pulse Rate:  [82-95] 90 (03/06 0548) Resp:  [16-28] 16 (03/06 0548) BP: (97-116)/(47-59) 106/59 mmHg (03/06 0548) SpO2:  [97 %-100 %] 98 % (03/06 0548)  Physical Exam: General: Fair appearing, appears chronically ill, mild distress Cardiovascular: Regular rate and rhythm, no murmur Respiratory: Clear to auscultation bilaterally Abdomen: Soft, non-tender, non-distended, reducible midline hernia Extremities: WWP Neuro: Persistent weakness, 2+ patellar reflexes, downward babinski  Laboratory:  Recent Labs Lab 08/08/14 1708 08/12/14 1658 08/13/14 0730  WBC 14.2* 16.2* 18.0*  HGB 13.4 13.0 11.9*  HCT 39.5 37.9* 34.6*  PLT 72* 121* 137*    Recent Labs Lab 08/08/14 1708 08/12/14 1658 08/13/14 0423  NA 138 139 135  K 4.1 4.2 3.8  CL 105 105 103  CO2 26 24 25   BUN 42* 26* 29*  CREATININE 1.64* 1.54* 1.56*  CALCIUM 9.0 8.5 8.0*  PROT 6.6 5.9*  --   BILITOT 2.9* 2.4*  --   ALKPHOS 108 104  --   ALT 104* 48  --   AST 61* 22  --  GLUCOSE 219* 350* 334*     Imaging/Diagnostic Tests: Dg Chest 2 View  08/12/2014   CLINICAL DATA:  Low back pain without trauma.  EXAM: CHEST  2 VIEW  COMPARISON:  None.  FINDINGS: Lateral view degraded by patient arm position. Lower thoracic spondylosis is moderate. Surgical clips which project over the right upper lobe and right  lung apex medially. Midline trachea. Marked cardiomegaly with atherosclerosis in the transverse aorta. No pleural effusion or pneumothorax. No congestive failure. Clear lungs.  IMPRESSION: Mild cardiomegaly and aortic atherosclerosis.  No acute findings.   Electronically Signed   By: Jeronimo Greaves M.D.   On: 08/12/2014 20:05   Dg Thoracic Spine 2 View  08/12/2014   CLINICAL DATA:  Thoracic back pain.  EXAM: THORACIC SPINE - 2 VIEW  COMPARISON:  None.  FINDINGS: There is no evidence of thoracic spine fracture. Alignment is normal. Degenerative osteophytosis is seen at multiple levels in the mid lower thoracic spine and the upper lumbar spine. No focal lytic or sclerotic bone lesions identified.  IMPRESSION: No acute findings.  Degenerative spondylosis, as described above.   Electronically Signed   By: Myles Rosenthal M.D.   On: 08/12/2014 20:06   Dg Lumbar Spine 2-3 Views  08/13/2014   CLINICAL DATA:  L2-3 lumbar laminectomy.  EXAM: LUMBAR SPINE - 2-3 VIEW  COMPARISON:  08/13/2014 MR and prior studies  FINDINGS: Lateral intraoperative views of the lumbar spine are submitted postoperatively for interpretation.  The same numbering system will be utilized as on the recent MR.  Film 1 demonstrates 2 posterior metallic probes, the first directed at the T12-L1 interspace an the second directed at the L2 vertebral body.  Film 2 demonstrates a posterior metallic probe in between the L2 and L3 spinous processes.  Film 3 demonstrates a posterior metallic probe directed at the L2-3 interspace.  IMPRESSION: Industrial/product designer Signed   By: Harmon Pier M.D.   On: 08/13/2014 19:13   Dg Lumbar Spine Complete  08/12/2014   CLINICAL DATA:  No injury.  Low back pain.  EXAM: LUMBAR SPINE - COMPLETE 4+ VIEW  COMPARISON:  MRI 02/17/2009 lumbar spine  FINDINGS: Five lumbar type vertebral bodies. Mild superior endplate irregularity at L3 is unchanged. Advanced degenerative disc disease and spondylosis. L2 through S1. No  malalignment. Advanced facet arthropathy within the lower lumbar spine and lumbosacral junction. No acute vertebral body height loss. Aortic and branch vessel atherosclerosis.  IMPRESSION: Advanced lumbar spondylosis, without acute superimposed process.   Electronically Signed   By: Jeronimo Greaves M.D.   On: 08/12/2014 20:03   Mr Thoracic Spine Wo Contrast  08/13/2014   CLINICAL DATA:  Lower extremity weakness. Multiple falls. Lower back and lower extremity pain. Remote history of upper thoracic spinal infection.  EXAM: MRI THORACIC SPINE WITHOUT CONTRAST  TECHNIQUE: Multiplanar, multisequence MR imaging of the thoracic spine was performed. No intravenous contrast was administered.  COMPARISON:  Thoracic spine radiographs 08/12/2014  FINDINGS: The examination had to be discontinued prior to completion as the patient is urgently going to the operating room for lumbar surgery. Axial merge images of the lower thoracic spine were not obtained. Images are mildly degraded by motion artifact.  Limited imaging of the cervical spine for localization purposes demonstrates multilevel spondylosis with large anterior osteophytes at C3-4 and likely multilevel intervertebral osseous fusion in the mid and lower cervical spine.  There is no significant listhesis in the thoracic spine. There appears to be intervertebral osseous fusion from  T1-T4. The T3 vertebral body appears small, likely reflecting prior erosion given remote history of osteomyelitis. There may be involvement of the T2 and T4 vertebral bodies as well. Thoracic lumbar vertebral body heights are preserved elsewhere. Vertebral bone marrow signal is moderately diminished diffusely. No marrow or disc space signal suggestive of active infection is identified.  Thoracic spinal cord is normal in signal. There is mild spinal stenosis at C7-T1 due to disc bulging and infolding of the ligamentum flavum. There is mild disc bulging at T6-7 greater than T9-10 and T10-11 without  significant spinal stenosis. Disc bulging and mild facet hypertrophy at T12-L1 result in borderline spinal stenosis and mild bilateral neural foraminal stenosis. There is also mild right neural foraminal stenosis at T10-11.  The thyroid is incompletely imaged but appears diffusely enlarged with several small nodules noted. Dilated, fluid-filled structure in the posterior mediastinum may reflect a gastric pull-through or patulous esophagus.  IMPRESSION: 1. No evidence of active infection in the thoracic spine. 2. Appearance of the upper thoracic spine as above likely reflecting remote history of osteomyelitis and fusion. 3. Mild thoracic spondylosis without evidence of significant stenosis. 4. Gastric pull-through versus patulous thoracic esophagus.   Electronically Signed   By: Sebastian Ache   On: 08/13/2014 15:56   Mr Lumbar Spine Wo Contrast  08/13/2014   ADDENDUM REPORT: 08/13/2014 12:42  ADDENDUM: These results were called by telephone at the time of interpretation on 08/13/2014 at 12:41 pm to Dr. Jaquita Rector, who verbally acknowledged these results.   Electronically Signed   By: Carey Bullocks M.D.   On: 08/13/2014 12:42   08/13/2014   CLINICAL DATA:  Low back pain for 2 months. No known injury. Remote back surgery. Initial encounter.  EXAM: MRI LUMBAR SPINE WITHOUT CONTRAST  TECHNIQUE: Multiplanar, multisequence MR imaging of the lumbar spine was performed. No intravenous contrast was administered.  COMPARISON:  Radiographs 08/12/2014.  MRI 02/17/2009.  FINDINGS: Radiographs demonstrate no visible ribs at T12 and 5 lumbar type vertebral bodies. This corresponds with the numbering utilized on the prior MRI. The alignment is stable with 4 mm of anterolisthesis at L4-5. There are endplate degenerative changes throughout the lumbar spine which are similar to the prior study. There is increased T2 hyperintensity within the L2-3 disc without gross endplate destruction. Underlying deformity of the superior endplate of  L3 appears unchanged.  The conus medullaris extends to the L1 level and appears normal. Extensive tortuosity of the nerve roots is again noted within the upper lumbar spinal canal. There is new ill-defined T2 hyperintensity within the right psoas muscle. No focal fluid collection identified.  T12-L1: Mildly progressive annular disc bulging with mild facet and ligamentous hypertrophy. No spinal stenosis or nerve root encroachment.  L1-2: Disc height and hydration are maintained. No spinal stenosis or nerve root encroachment.  L2-3: Previously, the patient had moderate to severe multifactorial spinal stenosis at this level. There is progressive disc degeneration with annular bulging and T2 hyperintensity within the disc as described above. There is resulting near complete effacement of the thecal sac consistent with severe spinal stenosis. Severe foraminal narrowing is present bilaterally with probable bilateral L2 nerve root encroachment. Facet and ligamentous hypertrophy does not appear significantly changed. There is no gross bone destruction.  L3-4: Chronic degenerative disc disease with loss of disc height, annular disc bulging and posterior osteophytes. There is moderate facet and ligamentous hypertrophy. The resulting spinal stenosis appears mildly progressive, moderate in degree. Moderate left greater than right foraminal stenosis appears  about the same.  L4-5: Chronic degenerative disc disease with loss of disc height, annular disc bulging and posterior osteophytes. Moderate to severe multifactorial spinal stenosis again noted. At least moderate right greater than left lateral recess and foraminal stenosis is similar to the prior study.  L5-S1: Chronic degenerative disc disease with loss of disc height, annular disc bulging and posterior osteophytes. The resulting mild spinal, lateral recess and foraminal stenosis bilaterally does not appear significantly changed.  IMPRESSION: 1. Compared with the prior MRI  from 02/17/2009, there are significantly progressive changes at the L2-3 disc space level with progressive disc bulging and discal T2 hyperintensity. These findings compress the thecal sac, resulting in severe spinal and biforaminal stenosis. Although the discal hyperintensity is new, there is no progressive bone destruction to suggest osteomyelitis. However, there is apparently a history of spinal osteomyelitis, and new T2 hyperintensity is demonstrated within the right psoas muscle such that disc space and paraspinal infection cannot be completely excluded. 2. Chronic spondylosis at L3-4, L4-5 and L5-S1. There is mildly progressive spinal stenosis at L3-4 without definite acute findings. 3. These results will be called to the ordering clinician or representative by the Radiologist Assistant, and communication documented in the PACS or zVision Dashboard.  Electronically Signed: By: Carey Bullocks M.D. On: 08/13/2014 12:07    Pincus Large, DO 08/14/2014, 6:18 AM PGY-1, [facility] FPTS Intern pager: [phone], text pages welcome

## 2014-08-14 NOTE — Progress Notes (Signed)
CRITICAL VALUE ALERT  Critical value received: Positive blood culture  Date of notification:  08/14/2014  Time of notification: 11:10am  Critical value read back: yes  Nurse who received alert: Trixie Deisranace Dalicia Kisner, RN  MD notified (1st page): [place]letke, Kirtland BouchardK.  Time of first page:  11:12am  MD notified (2nd page): N/A  Time of second page: N/A  Responding MD: Ignacia BayleyFletke, K.  Time MD responded:  11:15am

## 2014-08-15 ENCOUNTER — Encounter (HOSPITAL_COMMUNITY): Payer: Self-pay | Admitting: Neurosurgery

## 2014-08-15 DIAGNOSIS — M4646 Discitis, unspecified, lumbar region: Secondary | ICD-10-CM

## 2014-08-15 DIAGNOSIS — F32A Depression, unspecified: Secondary | ICD-10-CM

## 2014-08-15 DIAGNOSIS — R7881 Bacteremia: Secondary | ICD-10-CM

## 2014-08-15 DIAGNOSIS — E1165 Type 2 diabetes mellitus with hyperglycemia: Secondary | ICD-10-CM

## 2014-08-15 DIAGNOSIS — F329 Major depressive disorder, single episode, unspecified: Secondary | ICD-10-CM

## 2014-08-15 LAB — GLUCOSE, CAPILLARY
Glucose-Capillary: 217 mg/dL — ABNORMAL HIGH (ref 70–99)
Glucose-Capillary: 218 mg/dL — ABNORMAL HIGH (ref 70–99)
Glucose-Capillary: 195 mg/dL — ABNORMAL HIGH (ref 70–99)
Glucose-Capillary: 258 mg/dL — ABNORMAL HIGH (ref 70–99)

## 2014-08-15 LAB — BASIC METABOLIC PANEL
ANION GAP: 7 (ref 5–15)
BUN: 24 mg/dL — AB (ref 6–23)
CALCIUM: 7.9 mg/dL — AB (ref 8.4–10.5)
CO2: 23 mmol/L (ref 19–32)
CREATININE: 1.17 mg/dL (ref 0.50–1.35)
Chloride: 105 mmol/L (ref 96–112)
GFR calc Af Amer: 67 mL/min — ABNORMAL LOW (ref >90)
GFR calc non Af Amer: 57 mL/min — ABNORMAL LOW (ref >90)
Glucose, Bld: 236 mg/dL — ABNORMAL HIGH (ref 70–99)
Potassium: 4.2 mmol/L (ref 3.5–5.1)
Sodium: 135 mmol/L (ref 135–145)

## 2014-08-15 LAB — HEMOGLOBIN A1C
Hgb A1c MFr Bld: 6.6 % — ABNORMAL HIGH (ref 4.8–5.6)
Mean Plasma Glucose: 143 mg/dL

## 2014-08-15 LAB — URINE CULTURE
COLONY COUNT: NO GROWTH
Culture: NO GROWTH

## 2014-08-15 LAB — CBC
HCT: 33.9 % — ABNORMAL LOW (ref 39.0–52.0)
Hemoglobin: 11.5 g/dL — ABNORMAL LOW (ref 13.0–17.0)
MCH: 28.1 pg (ref 26.0–34.0)
MCHC: 33.9 g/dL (ref 30.0–36.0)
MCV: 82.9 fL (ref 78.0–100.0)
Platelets: 140 10*3/uL — ABNORMAL LOW (ref 150–400)
RBC: 4.09 MIL/uL — ABNORMAL LOW (ref 4.22–5.81)
RDW: 17.5 % — ABNORMAL HIGH (ref 11.5–15.5)
WBC: 10.7 10*3/uL — ABNORMAL HIGH (ref 4.0–10.5)

## 2014-08-15 LAB — WOUND CULTURE: Gram Stain: NONE SEEN

## 2014-08-15 MED ORDER — INSULIN ASPART 100 UNIT/ML ~~LOC~~ SOLN
0.0000 [IU] | Freq: Three times a day (TID) | SUBCUTANEOUS | Status: DC
  Administered 2014-08-15: 3 [IU] via SUBCUTANEOUS
  Administered 2014-08-16: 5 [IU] via SUBCUTANEOUS
  Administered 2014-08-16: 8 [IU] via SUBCUTANEOUS
  Administered 2014-08-16 – 2014-08-17 (×2): 5 [IU] via SUBCUTANEOUS

## 2014-08-15 MED ORDER — IBUPROFEN 200 MG PO TABS
400.0000 mg | ORAL_TABLET | Freq: Once | ORAL | Status: AC
  Administered 2014-08-15: 400 mg via ORAL
  Filled 2014-08-15: qty 2

## 2014-08-15 MED ORDER — OXYCODONE HCL 5 MG PO TABS
10.0000 mg | ORAL_TABLET | ORAL | Status: DC | PRN
  Administered 2014-08-15 – 2014-08-18 (×13): 10 mg via ORAL
  Filled 2014-08-15 (×13): qty 2

## 2014-08-15 MED ORDER — SERTRALINE HCL 25 MG PO TABS
25.0000 mg | ORAL_TABLET | Freq: Every day | ORAL | Status: DC
  Administered 2014-08-16 – 2014-08-19 (×4): 25 mg via ORAL
  Filled 2014-08-15 (×5): qty 1

## 2014-08-15 MED ORDER — SENNOSIDES-DOCUSATE SODIUM 8.6-50 MG PO TABS
2.0000 | ORAL_TABLET | Freq: Two times a day (BID) | ORAL | Status: DC
  Administered 2014-08-15 – 2014-08-19 (×8): 2 via ORAL
  Filled 2014-08-15 (×8): qty 2

## 2014-08-15 MED ORDER — OXYCODONE HCL 5 MG PO TABS
10.0000 mg | ORAL_TABLET | ORAL | Status: AC
  Administered 2014-08-15: 10 mg via ORAL
  Filled 2014-08-15: qty 2

## 2014-08-15 MED ORDER — ACETAMINOPHEN 500 MG PO TABS
500.0000 mg | ORAL_TABLET | Freq: Four times a day (QID) | ORAL | Status: DC
  Administered 2014-08-15 – 2014-08-19 (×16): 500 mg via ORAL
  Filled 2014-08-15 (×15): qty 1

## 2014-08-15 NOTE — Progress Notes (Signed)
Chaplain responded to spiritual care consult. Chaplain had difficulty understanding pt at times. Chaplain offered prayer. Chaplain will attempt follow up at another time.   08/15/14 1300  Clinical Encounter Type  Visited With Patient  Visit Type Initial;Spiritual support  Referral From Nurse  Spiritual Encounters  Spiritual Needs Emotional;Prayer  Charmian MuffStamey, Hobie Kohles F, Chaplain 08/15/2014 1:30 PM

## 2014-08-15 NOTE — Consult Note (Signed)
[facility]  Total days of antibiotics 3        Day 3 vancomycin        Day 3 ceftriaxone               Reason for Consult: discitis and bacteremia    Referring Physician: fletke  Active Problems:   Weakness of both legs   Leg weakness   Lumbar radiculopathy, acute   Type 2 diabetes mellitus with diabetic chronic kidney disease   Essential hypertension   Leukocytosis   Frequent falls   S/P laminectomy    HPI: Carlos Andrews is a 81 y.o. male with remote hx of thoracic spine infection in 1980s, DM, reported to having increasing falls at home due to lower extremity weakness. He was admitted on 3/4 for evaluation of acute lumbar radiculopathy. Imaging suggested spinal stenosis at L2-3 disc space and some signally within right posas muscle possibly reflection of paraspinal infection. On 3/5, he underwent L2 and L3 decompressive lumbar laminectomy, with microdissection, microsurgical technique, and the operating microscope with intraoperative swab cultures of the ventral epidural space. OR cx grew microaerophilic strep while his blood cx have yet to speciate GPC in prs and chains, likely same pathogen. During his hospitalization, he was empirically started on vancomycin plus ceftriaxone. Leukocytosis is improving, however he did have his first fever during this hospitalization, up to 100.8 yesterday.  Past Medical History  Diagnosis Date  . DIABETES MELLITUS, II, COMPLICATIONS 9/45/0388  . Esophageal perforation 1993    managed at [facility]  . PPD positive 1993    Found at [facility]  . Spinal stenosis of lumbar region at multiple levels   . Proteinuria 08/07/2006    Qualifier: Diagnosis of  By: Drucie Ip    . Open angle with borderline findings and low glaucoma risk in both eyes 02/23/2013  . Regular astigmatism 02/23/2013  . Presbyopia 02/23/2013  . Ptosis of left eyelid 02/23/2013  . At risk for falling 10/04/2013  . GAIT DISTURBANCE 08/01/2008        .  HYPERLIPIDEMIA 08/07/2006    Qualifier: Diagnosis of  By: Drucie Ip    . HYPERTENSION, BENIGN SYSTEMIC 08/07/2006    Qualifier: Diagnosis of  By: Drucie Ip    . 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 LUMBAR SPRAIN AND STRAIN 07/19/2008    Qualifier: Diagnosis of  By: Lindell Noe MD, Jeneen Rinks    . Myopia with presbyopia of left eye 02/23/2013  . OBESITY, NOS 08/07/2006    Qualifier: Diagnosis of  By: Drucie Ip    . OSTEOARTHRITIS, LOWER LEG 08/07/2006    Qualifier: Diagnosis of  By: Drucie Ip    . PERIPHERAL EDEMA 08/01/2008    Qualifier: Diagnosis of  By: Walker Kehr MD, Patrick Jupiter    . Peripheral vascular disease, unspecified 04/21/2012    Severe tibial occlusive disease (Dr Kellie Simmering, [facility], 04/2002)     . Recurrent falls 10/04/2013  . Skin ulcer of foot, left heel 02/23/2013  . Type II or unspecified type diabetes mellitus without mention of complication, not stated as uncontrolled 08/07/2006    Qualifier: Diagnosis of  By: Drucie Ip    . Unspecified hereditary and idiopathic peripheral neuropathy 08/07/2006        . Proteinuria 08/07/2006  . Impairment of balance 05/10/2014  . Urge incontinence 05/10/2014  . Impaired mobility and ADLs 05/10/2014    Allergies:  Allergies  Allergen Reactions  . Naprosyn [Naproxen] Other (See Comments)    GI upset  . Rofecoxib     REACTION: dizzy  . Tramadol Hcl     REACTION: vomiting    MEDICATIONS: . acetaminophen  500 mg Oral Q6H  . aspirin EC  81 mg Oral Daily  . calcium-vitamin D  1 tablet Oral Daily  . cefTRIAXone (ROCEPHIN)  IV  2 g Intravenous Q24H  . gabapentin  300 mg Oral BID  . insulin aspart  0-15 Units Subcutaneous TID WC  . multivitamin with minerals  1 tablet Oral Daily  . pyridOXINE  100 mg Oral TID  . senna-docusate  2 tablet Oral BID  . sertraline  25 mg Oral Daily  . vancomycin  1,000 mg Intravenous Q24H  . vitamin B-12  1,000 mcg Oral Daily  . vitamin C  500 mg Oral  Daily    History  Substance Use Topics  . Smoking status: Former Smoker    Types: Cigarettes    Quit date: 04/22/1987  . Smokeless tobacco: Never Used  . Alcohol Use: No    Family History  Problem Relation Age of Onset  . Diabetes Mother   . Diabetes Father   . Hypertension Father   . Other Father     amputaion  . Diabetes Son   . Hypertension Son      Review of Systems  Constitutional: Negative for fever, chills, diaphoresis, activity change, appetite change, fatigue and unexpected weight change.  HENT: Negative for congestion, sore throat, rhinorrhea, sneezing, trouble swallowing and sinus pressure.  Eyes: Negative for photophobia and visual disturbance.  Respiratory: Negative for cough, chest tightness, shortness of breath, wheezing and stridor.  Cardiovascular: Negative for chest pain, palpitations and leg swelling.  Gastrointestinal: Negative for nausea, vomiting, abdominal pain, diarrhea, constipation, blood in stool, abdominal distention and anal bleeding.  Genitourinary: Negative for dysuria, hematuria, flank pain and difficulty urinating.  Musculoskeletal: positive back pain and unsteady gait  Skin: Negative for color change, pallor, rash and wound.  Neurological: Negative for dizziness, tremors, weakness and light-headedness.  Hematological: Negative for adenopathy. Does not bruise/bleed easily.  Psychiatric/Behavioral: Negative for behavioral problems, confusion, sleep disturbance, dysphoric mood, decreased concentration and agitation.    OBJECTIVE: Temp:  [98.6 F (37 C)-100.8 F (38.2 C)] 98.9 F (37.2 C) (03/07 0637) Pulse Rate:  [94-102] 94 (03/07 0637) Resp:  [16-18] 16 (03/07 0637) BP: (123-126)/(62-71) 123/70 mmHg (03/07 0637) SpO2:  [95 %-99 %] 98 % (03/07 9518) Physical Exam  Constitutional: He is oriented to person, place, somewhat groggy today with recent pain medication. He appears well-developed and well-nourished. No distress.  HENT:    Mouth/Throat: Oropharynx is clear and moist. No oropharyngeal exudate. Poor dentition Cardiovascular: Normal rate, regular rhythm and normal heart sounds. Exam reveals no gallop and no friction rub.  No murmur heard.  Pulmonary/Chest: Effort normal and breath sounds normal. No respiratory distress. He has no wheezes.  Abdominal: Soft. Bowel sounds are normal. He exhibits no distension. There is no tenderness.  Lymphadenopathy:  He has no cervical adenopathy.  Neurological: decreased strength motor 4/5 grip and leg movement, possibly due to poor effort, fatigue Skin: Skin is warm and dry. No rash noted. No erythema. Heels and ankles wrapped for unclear reasons. No skin lesions noted Psychiatric: He has a normal mood and affect. His behavior is normal.     LABS: Results for orders placed or performed during the hospital encounter of 08/12/14 (from the past 48 hour(s))  Troponin I (q 6hr x 3)     Status: Abnormal   Collection Time: 08/13/14 12:57 PM  Result Value Ref Range   Troponin I 0.09 (H) <0.031 ng/mL    Comment:        PERSISTENTLY INCREASED TROPONIN VALUES IN THE RANGE OF 0.04-0.49 ng/mL CAN BE SEEN IN:       -UNSTABLE ANGINA       -CONGESTIVE HEART FAILURE       -MYOCARDITIS       -CHEST TRAUMA       -ARRYHTHMIAS       -LATE PRESENTING MYOCARDIAL INFARCTION       -COPD   CLINICAL FOLLOW-UP RECOMMENDED.   Lactic acid, plasma     Status: Abnormal   Collection Time: 08/13/14  2:05 PM  Result Value Ref Range   Lactic Acid, Venous 2.3 (HH) 0.5 - 2.0 mmol/L    Comment: REPEATED TO VERIFY CRITICAL RESULT CALLED TO, READ BACK BY AND VERIFIED WITH: RN SCOTT,B AT 25 [id number] MARTINB   Culture, blood (routine x 2)     Status: None (Preliminary result)   Collection Time: 08/13/14  2:05 PM  Result Value Ref Range   Specimen Description BLOOD LEFT ARM    Special Requests BOTTLES DRAWN AEROBIC AND ANAEROBIC 10 CC    Culture      GRAM POSITIVE COCCI IN PAIRS AND CHAINS Note:  Gram Stain Report Called to,Read Back By and Verified With: TRANACE DAYE @ 63 ON [id number] BY [facility] Performed at [facility]    Report Status PENDING   Culture, blood (routine x 2)     Status: None (Preliminary result)   Collection Time: 08/13/14  2:15 PM  Result Value Ref Range   Specimen Description BLOOD LEFT HAND    Special Requests BOTTLES DRAWN AEROBIC ONLY 10 CC    Culture      GRAM POSITIVE COCCI IN PAIRS AND CHAINS Note: Gram Stain Report Called to,Read Back By and Verified With: TRANACE DAYE @ 1914 ON [id number] BY [facility] Performed at [facility]    Report Status PENDING   Culture, Urine     Status: None   Collection Time: 08/13/14  2:16 PM  Result Value Ref Range   Specimen Description URINE, CATHETERIZED    Special Requests NONE    Colony Count NO GROWTH Performed at [facility]     Culture NO GROWTH Performed at [facility]     Report Status 08/15/2014 FINAL   Glucose, capillary     Status: Abnormal   Collection Time: 08/13/14  4:16 PM  Result Value Ref Range   Glucose-Capillary 183 (H) 70 - 99 mg/dL  Anaerobic culture     Status: None (Preliminary result)   Collection Time: 08/13/14  6:13 PM  Result Value Ref Range   Specimen Description WOUND    Special Requests LUMBAR    Gram Stain      NO WBC SEEN NO SQUAMOUS EPITHELIAL CELLS SEEN NO ORGANISMS SEEN Performed at [facility]    Culture      NO ANAEROBES ISOLATED; CULTURE IN PROGRESS FOR 5 DAYS Performed at [facility]    Report Status PENDING   Gram stain     Status: None   Collection Time: 08/13/14  6:13 PM  Result Value Ref Range   Specimen Description WOUND    Special Requests LUMBAR    Gram Stain      NO WBC SEEN NO SQUAMOUS  EPITHELIAL CELLS SEEN NO ORGANISMS SEEN Performed at [facility]    Report Status 08/13/2014 FINAL   Wound culture     Status: None   Collection Time: 08/13/14  6:13 PM  Result Value Ref Range   Specimen  Description WOUND    Special Requests LUMBAR    Gram Stain      NO WBC SEEN NO SQUAMOUS EPITHELIAL CELLS SEEN NO ORGANISMS SEEN Performed at [facility]    Culture      FEW MICROAEROPHILIC STREPTOCOCCI Note: Standardized susceptibility testing for this organism is not available. Performed at [facility]    Report Status 08/15/2014 FINAL   Glucose, capillary     Status: Abnormal   Collection Time: 08/13/14  7:11 PM  Result Value Ref Range   Glucose-Capillary 205 (H) 70 - 99 mg/dL   Comment 1 Notify RN   Sedimentation rate     Status: Abnormal   Collection Time: 08/13/14  8:59 PM  Result Value Ref Range   Sed Rate 51 (H) 0 - 16 mm/hr  Glucose, capillary     Status: Abnormal   Collection Time: 08/14/14  6:43 AM  Result Value Ref Range   Glucose-Capillary 268 (H) 70 - 99 mg/dL  CBC     Status: Abnormal   Collection Time: 08/14/14  7:45 AM  Result Value Ref Range   WBC 12.1 (H) 4.0 - 10.5 K/uL   RBC 4.08 (L) 4.22 - 5.81 MIL/uL   Hemoglobin 11.3 (L) 13.0 - 17.0 g/dL   HCT 33.0 (L) 39.0 - 52.0 %   MCV 80.9 78.0 - 100.0 fL   MCH 27.7 26.0 - 34.0 pg   MCHC 34.2 30.0 - 36.0 g/dL   RDW 17.2 (H) 11.5 - 15.5 %   Platelets 135 (L) 150 - 400 K/uL    Comment: PLATELET COUNT CONFIRMED BY SMEAR  Basic metabolic panel     Status: Abnormal   Collection Time: 08/14/14  7:45 AM  Result Value Ref Range   Sodium 138 135 - 145 mmol/L   Potassium 3.9 3.5 - 5.1 mmol/L   Chloride 105 96 - 112 mmol/L   CO2 26 19 - 32 mmol/L   Glucose, Bld 259 (H) 70 - 99 mg/dL   BUN 30 (H) 6 - 23 mg/dL   Creatinine, Ser 1.40 (H) 0.50 - 1.35 mg/dL   Calcium 7.9 (L) 8.4 - 10.5 mg/dL   GFR calc non Af Amer 46 (L) >90 mL/min   GFR calc Af Amer 54 (L) >90 mL/min    Comment: (NOTE) The eGFR has been calculated using the CKD EPI equation. This calculation has not been validated in all clinical situations. eGFR's persistently <90 mL/min signify possible Chronic Kidney Disease.    Anion gap 7 5  - 15  Glucose, capillary     Status: Abnormal   Collection Time: 08/14/14 12:37 PM  Result Value Ref Range   Glucose-Capillary 235 (H) 70 - 99 mg/dL  Glucose, capillary     Status: Abnormal   Collection Time: 08/14/14  4:34 PM  Result Value Ref Range   Glucose-Capillary 219 (H) 70 - 99 mg/dL  Glucose, capillary     Status: Abnormal   Collection Time: 08/14/14 10:23 PM  Result Value Ref Range   Glucose-Capillary 212 (H) 70 - 99 mg/dL  CBC     Status: Abnormal   Collection Time: 08/15/14  6:21 AM  Result Value Ref Range   WBC 10.7 (H) 4.0 -  10.5 K/uL   RBC 4.09 (L) 4.22 - 5.81 MIL/uL   Hemoglobin 11.5 (L) 13.0 - 17.0 g/dL   HCT 33.9 (L) 39.0 - 52.0 %   MCV 82.9 78.0 - 100.0 fL   MCH 28.1 26.0 - 34.0 pg   MCHC 33.9 30.0 - 36.0 g/dL   RDW 17.5 (H) 11.5 - 15.5 %   Platelets 140 (L) 150 - 400 K/uL  Basic metabolic panel     Status: Abnormal   Collection Time: 08/15/14  6:21 AM  Result Value Ref Range   Sodium 135 135 - 145 mmol/L   Potassium 4.2 3.5 - 5.1 mmol/L   Chloride 105 96 - 112 mmol/L   CO2 23 19 - 32 mmol/L   Glucose, Bld 236 (H) 70 - 99 mg/dL   BUN 24 (H) 6 - 23 mg/dL   Creatinine, Ser 1.17 0.50 - 1.35 mg/dL   Calcium 7.9 (L) 8.4 - 10.5 mg/dL   GFR calc non Af Amer 57 (L) >90 mL/min   GFR calc Af Amer 67 (L) >90 mL/min    Comment: (NOTE) The eGFR has been calculated using the CKD EPI equation. This calculation has not been validated in all clinical situations. eGFR's persistently <90 mL/min signify possible Chronic Kidney Disease.    Anion gap 7 5 - 15  Glucose, capillary     Status: Abnormal   Collection Time: 08/15/14  6:40 AM  Result Value Ref Range   Glucose-Capillary 217 (H) 70 - 99 mg/dL  Glucose, capillary     Status: Abnormal   Collection Time: 08/15/14 11:42 AM  Result Value Ref Range   Glucose-Capillary 218 (H) 70 - 99 mg/dL   Comment 1 Documented in Char    Comment 2 Repeat Test     MICRO:  IMAGING: Dg Lumbar Spine 2-3 Views  08/13/2014    CLINICAL DATA:  L2-3 lumbar laminectomy.  EXAM: LUMBAR SPINE - 2-3 VIEW  COMPARISON:  08/13/2014 MR and prior studies  FINDINGS: Lateral intraoperative views of the lumbar spine are submitted postoperatively for interpretation.  The same numbering system will be utilized as on the recent MR.  Film 1 demonstrates 2 posterior metallic probes, the first directed at the T12-L1 interspace an the second directed at the L2 vertebral body.  Film 2 demonstrates a posterior metallic probe in between the L2 and L3 spinous processes.  Film 3 demonstrates a posterior metallic probe directed at the L2-3 interspace.  IMPRESSION: Haematologist Signed   By: Margarette Canada M.D.   On: 08/13/2014 19:13   Mr Thoracic Spine Wo Contrast  08/13/2014   CLINICAL DATA:  Lower extremity weakness. Multiple falls. Lower back and lower extremity pain. Remote history of upper thoracic spinal infection.  EXAM: MRI THORACIC SPINE WITHOUT CONTRAST  TECHNIQUE: Multiplanar, multisequence MR imaging of the thoracic spine was performed. No intravenous contrast was administered.  COMPARISON:  Thoracic spine radiographs 08/12/2014  FINDINGS: The examination had to be discontinued prior to completion as the patient is urgently going to the operating room for lumbar surgery. Axial merge images of the lower thoracic spine were not obtained. Images are mildly degraded by motion artifact.  Limited imaging of the cervical spine for localization purposes demonstrates multilevel spondylosis with large anterior osteophytes at C3-4 and likely multilevel intervertebral osseous fusion in the mid and lower cervical spine.  There is no significant listhesis in the thoracic spine. There appears to be intervertebral osseous fusion from T1-T4. The T3 vertebral body appears  small, likely reflecting prior erosion given remote history of osteomyelitis. There may be involvement of the T2 and T4 vertebral bodies as well. Thoracic lumbar vertebral body heights are  preserved elsewhere. Vertebral bone marrow signal is moderately diminished diffusely. No marrow or disc space signal suggestive of active infection is identified.  Thoracic spinal cord is normal in signal. There is mild spinal stenosis at C7-T1 due to disc bulging and infolding of the ligamentum flavum. There is mild disc bulging at T6-7 greater than T9-10 and T10-11 without significant spinal stenosis. Disc bulging and mild facet hypertrophy at T12-L1 result in borderline spinal stenosis and mild bilateral neural foraminal stenosis. There is also mild right neural foraminal stenosis at T10-11.  The thyroid is incompletely imaged but appears diffusely enlarged with several small nodules noted. Dilated, fluid-filled structure in the posterior mediastinum may reflect a gastric pull-through or patulous esophagus.  IMPRESSION: 1. No evidence of active infection in the thoracic spine. 2. Appearance of the upper thoracic spine as above likely reflecting remote history of osteomyelitis and fusion. 3. Mild thoracic spondylosis without evidence of significant stenosis. 4. Gastric pull-through versus patulous thoracic esophagus.   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 Hyperglycemia possibly related to underlying discitis - health  maintenance = will check hep c and hiv  [facility] Andrews for Infectious Diseases [phone]

## 2014-08-15 NOTE — Progress Notes (Signed)
Pt with increasing temps since start of shift: 101.5 rectal @ 2040 (500 mg PO tylenol given at this time); 102.6 rectal at 2213. MD on call notified. Received orders for 400 mg PO motrin and to notify if temperature remains unchanged. Nursing will continue to monitor.

## 2014-08-15 NOTE — Progress Notes (Signed)
Family Medicine Teaching Service Daily Progress Note Intern Pager: [phone]  Patient name: Carlos Andrews Medical record number: [id number] Date of birth: 06-14-1932 Age: 81 y.o. Gender: male  Primary Care Provider: MCDIARMID,TODD D, MD Consultants: None Code Status: DNR  Pt Overview and Major Events to Date:  3/4: Patient admitted for back pain and weakness 3/5: L2-L3 Laminectomy performed 3/6: Blood cultures positive for gram + cocci in pairs and chains  Assessment and Plan: [name] is a 81 y.o. male presenting with bilateral lower extremity weakness. PMH is significant for T2DM, HTN, and recurrent falls.  Lower extremity weakness: DDD with L2-3 disc buldge s/p laminectomy.Persistent. Patient not ambulatory at all. Uses wheelchair at home. - PT/OT consulted - recommend SNF; awaiting placement - CSW consulted - fall precautions  CKD stage 3: Unsure of baseline but last Cr 1.2 in 2014. On admission 1.54. Elevation possibly due to volume depletion vs new baseline. UA with >1000 glucose and ketones no acidosis to suggest DKA at this time. S/p fluid bolus and MIVF. Creatinine stable - encourage PO intake - trend BMET - avoid nephrotoxic agents  Back Pain: DG lumbar and thoracic significant for degenerative spondylosis. Chronic pain. Hx of spinal stenosis of lumbar region. -neurosurgery was consulted after MRI showing moderate to severe lumbar stenosis with suspected infection -now s/p lumbar laminectomy POD #2 -Pain management: morphine q3 prn, oxycodone q4 prn, and tylenol scheduled q6 -bowel regimen started senokot 2 tabs BID  Depression: Patient stating her no longer feels like being here. Feels as though he is a burden. Thinks he will not survive this -start medication Sertraline -spiritual consult placed  Hypocalcemia: corrected to 10.1 due to low albumin  HTN: Normotensive BPs. -Will hold home Lisinopril.  T2DM: Last A1c 5.4 (09/2013). Patient takes Amaryl at home.  Fasting CBG of 296 on 3/5. CBGs 200-300. Elevated sugars likely due to infection.  - A1c 5.5 - Holding Amaryl at this time  - Will increase to moderate SSI   Bacteremia: On admission leukocytosis 16.2 >18.0>12.1>10.7. Blood cultures growing gram+ cocci in pairs and chains. Lactic acid 2.3. Possible source of infection discitis and psosas infection. No evidence of infection or purulence found in epidural space. Hx of spinal osteomyelitis.   Patient with temperature 100.32F yesterday PM on Abx. - trend CBC -urine and wound culture no growth -continue antibiotics vancomycin and ceftriaxone for now; awaiting sensitivites -ID consulted; appreciate recs  -Patient may need long term Abx so possible PICC  History of left heel pressure ulcer - heel pads  Elevated troponin: no chest pain. Heart score: 3. EKG with non-specific t-wave changes that are unchanged from EKG seven days ago. Likely due to CKD 3 with AKI. - elevated troponins - cardiology believes this to be baseline for patient - EKG with sinus rhythm  FEN/GI: Carb modified diet/ SLIV Prophylaxis: Hep Subq  Disposition: Continue current management as above; pending placement and pain control.  Subjective:  Continues to have pain in low back that he states is 10/10. He is requesting more pain medication. States that he feels like he will not recover from this. Just wants to go home. Patient says "I do not want to be here anymore."   Objective: Temp:  [98.6 F (37 C)-100.8 F (38.2 C)] 98.9 F (37.2 C) (03/07 0637) Pulse Rate:  [94-102] 94 (03/07 0637) Resp:  [16-18] 16 (03/07 0637) BP: (123-126)/(62-71) 123/70 mmHg (03/07 0637) SpO2:  [95 %-99 %] 98 % (03/07 0865)  Physical Exam: General: Fair  appearing, appears chronically ill, mild distress Cardiovascular: Regular rate and rhythm, no murmur Respiratory: Clear to auscultation bilaterally Abdomen: Soft, non-tender, non-distended, reducible midline hernia Extremities:  WWP Neuro: Persistent weakness BLE   Laboratory:  Recent Labs Lab 08/12/14 1658 08/13/14 0730 08/14/14 0745  WBC 16.2* 18.0* 12.1*  HGB 13.0 11.9* 11.3*  HCT 37.9* 34.6* 33.0*  PLT 121* 137* 135*    Recent Labs Lab 08/08/14 1708 08/12/14 1658 08/13/14 0423 08/14/14 0745  NA 138 139 135 138  K 4.1 4.2 3.8 3.9  CL 105 105 103 105  CO2 BUN 42* 26* 29* 30*  CREATININE 1.64* 1.54* 1.56* 1.40*  CALCIUM 9.0 8.5 8.0* 7.9*  PROT 6.6 5.9*  --   --   BILITOT 2.9* 2.4*  --   --   ALKPHOS 108 104  --   --   ALT 104* 48  --   --   AST 61* 22  --   --   GLUCOSE 219* 350* 334* 259*     Imaging/Diagnostic Tests: Dg Lumbar Spine 2-3 Views  08/13/2014   CLINICAL DATA:  L2-3 lumbar laminectomy.  EXAM: LUMBAR SPINE - 2-3 VIEW  COMPARISON:  08/13/2014 MR and prior studies  FINDINGS: Lateral intraoperative views of the lumbar spine are submitted postoperatively for interpretation.  The same numbering system will be utilized as on the recent MR.  Film 1 demonstrates 2 posterior metallic probes, the first directed at the T12-L1 interspace an the second directed at the L2 vertebral body.  Film 2 demonstrates a posterior metallic probe in between the L2 and L3 spinous processes.  Film 3 demonstrates a posterior metallic probe directed at the L2-3 interspace.  IMPRESSION: Industrial/product designer Signed   By: Harmon Pier M.D.   On: 08/13/2014 19:13   Mr Thoracic Spine Wo Contrast  08/13/2014   CLINICAL DATA:  Lower extremity weakness. Multiple falls. Lower back and lower extremity pain. Remote history of upper thoracic spinal infection.  EXAM: MRI THORACIC SPINE WITHOUT CONTRAST  TECHNIQUE: Multiplanar, multisequence MR imaging of the thoracic spine was performed. No intravenous contrast was administered.  COMPARISON:  Thoracic spine radiographs 08/12/2014  FINDINGS: The examination had to be discontinued prior to completion as the patient is urgently going to the operating room for  lumbar surgery. Axial merge images of the lower thoracic spine were not obtained. Images are mildly degraded by motion artifact.  Limited imaging of the cervical spine for localization purposes demonstrates multilevel spondylosis with large anterior osteophytes at C3-4 and likely multilevel intervertebral osseous fusion in the mid and lower cervical spine.  There is no significant listhesis in the thoracic spine. There appears to be intervertebral osseous fusion from T1-T4. The T3 vertebral body appears small, likely reflecting prior erosion given remote history of osteomyelitis. There may be involvement of the T2 and T4 vertebral bodies as well. Thoracic lumbar vertebral body heights are preserved elsewhere. Vertebral bone marrow signal is moderately diminished diffusely. No marrow or disc space signal suggestive of active infection is identified.  Thoracic spinal cord is normal in signal. There is mild spinal stenosis at C7-T1 due to disc bulging and infolding of the ligamentum flavum. There is mild disc bulging at T6-7 greater than T9-10 and T10-11 without significant spinal stenosis. Disc bulging and mild facet hypertrophy at T12-L1 result in borderline spinal stenosis and mild bilateral neural foraminal stenosis. There is also mild right neural foraminal stenosis at T10-11.  The thyroid is incompletely  imaged but appears diffusely enlarged with several small nodules noted. Dilated, fluid-filled structure in the posterior mediastinum may reflect a gastric pull-through or patulous esophagus.  IMPRESSION: 1. No evidence of active infection in the thoracic spine. 2. Appearance of the upper thoracic spine as above likely reflecting remote history of osteomyelitis and fusion. 3. Mild thoracic spondylosis without evidence of significant stenosis. 4. Gastric pull-through versus patulous thoracic esophagus.   Electronically Signed   By: Sebastian Ache   On: 08/13/2014 15:56   Mr Lumbar Spine Wo Contrast  08/13/2014    ADDENDUM REPORT: 08/13/2014 12:42  ADDENDUM: These results were called by telephone at the time of interpretation on 08/13/2014 at 12:41 pm to Dr. Jaquita Rector, who verbally acknowledged these results.   Electronically Signed   By: Carey Bullocks M.D.   On: 08/13/2014 12:42   08/13/2014   CLINICAL DATA:  Low back pain for 2 months. No known injury. Remote back surgery. Initial encounter.  EXAM: MRI LUMBAR SPINE WITHOUT CONTRAST  TECHNIQUE: Multiplanar, multisequence MR imaging of the lumbar spine was performed. No intravenous contrast was administered.  COMPARISON:  Radiographs 08/12/2014.  MRI 02/17/2009.  FINDINGS: Radiographs demonstrate no visible ribs at T12 and 5 lumbar type vertebral bodies. This corresponds with the numbering utilized on the prior MRI. The alignment is stable with 4 mm of anterolisthesis at L4-5. There are endplate degenerative changes throughout the lumbar spine which are similar to the prior study. There is increased T2 hyperintensity within the L2-3 disc without gross endplate destruction. Underlying deformity of the superior endplate of L3 appears unchanged.  The conus medullaris extends to the L1 level and appears normal. Extensive tortuosity of the nerve roots is again noted within the upper lumbar spinal canal. There is new ill-defined T2 hyperintensity within the right psoas muscle. No focal fluid collection identified.  T12-L1: Mildly progressive annular disc bulging with mild facet and ligamentous hypertrophy. No spinal stenosis or nerve root encroachment.  L1-2: Disc height and hydration are maintained. No spinal stenosis or nerve root encroachment.  L2-3: Previously, the patient had moderate to severe multifactorial spinal stenosis at this level. There is progressive disc degeneration with annular bulging and T2 hyperintensity within the disc as described above. There is resulting near complete effacement of the thecal sac consistent with severe spinal stenosis. Severe foraminal  narrowing is present bilaterally with probable bilateral L2 nerve root encroachment. Facet and ligamentous hypertrophy does not appear significantly changed. There is no gross bone destruction.  L3-4: Chronic degenerative disc disease with loss of disc height, annular disc bulging and posterior osteophytes. There is moderate facet and ligamentous hypertrophy. The resulting spinal stenosis appears mildly progressive, moderate in degree. Moderate left greater than right foraminal stenosis appears about the same.  L4-5: Chronic degenerative disc disease with loss of disc height, annular disc bulging and posterior osteophytes. Moderate to severe multifactorial spinal stenosis again noted. At least moderate right greater than left lateral recess and foraminal stenosis is similar to the prior study.  L5-S1: Chronic degenerative disc disease with loss of disc height, annular disc bulging and posterior osteophytes. The resulting mild spinal, lateral recess and foraminal stenosis bilaterally does not appear significantly changed.  IMPRESSION: 1. Compared with the prior MRI from 02/17/2009, there are significantly progressive changes at the L2-3 disc space level with progressive disc bulging and discal T2 hyperintensity. These findings compress the thecal sac, resulting in severe spinal and biforaminal stenosis. Although the discal hyperintensity is new, there is no progressive bone destruction  to suggest osteomyelitis. However, there is apparently a history of spinal osteomyelitis, and new T2 hyperintensity is demonstrated within the right psoas muscle such that disc space and paraspinal infection cannot be completely excluded. 2. Chronic spondylosis at L3-4, L4-5 and L5-S1. There is mildly progressive spinal stenosis at L3-4 without definite acute findings. 3. These results will be called to the ordering clinician or representative by the Radiologist Assistant, and communication documented in the PACS or zVision Dashboard.   Electronically Signed: By: Carey BullocksWilliam  Veazey M.D. On: 08/13/2014 12:07    Pincus LargeJazma Y Phelps, DO 08/15/2014, 7:33 AM PGY-1, [facility] FPTS Intern pager: [phone], text pages welcome

## 2014-08-15 NOTE — Progress Notes (Signed)
Patient complained of pain during some times of the night. However patient stated that he does not want any type of pain medication. RN charted patient refused meds on assessment/reassessment. Nursing will continue to monitor.

## 2014-08-15 NOTE — Progress Notes (Signed)
Patient states pain 10/10. Patient now states he would like pain medication. Nursing will continue to monitor.

## 2014-08-15 NOTE — Progress Notes (Signed)
Inpatient Diabetes Program Recommendations  AACE/ADA: New Consensus Statement on Inpatient Glycemic Control (2013)  Target Ranges:  Prepandial:   less than 140 mg/dL      Peak postprandial:   less than 180 mg/dL (1-2 hours)      Critically ill patients:  140 - 180 mg/dL     Results for Carlos Andrews, [name] (MRN [id number]) as of 08/15/2014 09:50  Ref. Range 08/14/2014 06:43 08/14/2014 12:37 08/14/2014 16:34 08/14/2014 22:23  Glucose-Capillary Latest Range: 70-99 mg/dL 409268 (H) 811235 (H) 914219 (H) 212 (H)    Results for Carlos Andrews, Carlos Andrews (MRN [id number]) as of 08/15/2014 09:50  Ref. Range 08/15/2014 06:40  Glucose-Capillary Latest Range: 70-99 mg/dL 086217 (H)    Chief Complaint: Lumbar stenosis  History: DM, HTN, CKD  Home DM Meds: Amaryl 1 mg daily  Current DM Orders: Novolog Sensitive SSI    **A1c pending  **Patient POD #2 back surgery    MD- Note A1c pending.  Having sustained glucose elevations.   Please consider adding basal insulin to help better control glucose levels in hospital- Lantus 15 units daily (0.2 units/kg dosing)    Will follow Ambrose FinlandJeannine Johnston Codey Burling RN, MSN, CDE Diabetes Coordinator Inpatient Diabetes Program Team Pager: [phone] (8a-10p)'

## 2014-08-15 NOTE — Progress Notes (Signed)
Subjective: Patient notes pain in back is somewhat decreased. Fever to 100.8. White blood cell count continues to improve, now 10.7. Blood cultures positive for gram-positive cocci in pairs and clusters. Wound culture both aerobic and anaerobic show no growth so far. Continues on ceftriaxone and vancomycin. PT resumed yesterday, awaiting OT to resume.  Objective: Vital signs in last 24 hours: Filed Vitals:   08/14/14 1418 08/14/14 2018 08/14/14 2225 08/15/14 0637  BP: 126/71 123/62  123/70  Pulse: 101 102  94  Temp: 98.6 F (37 C) 100.8 F (38.2 C) 99.7 F (37.6 C) 98.9 F (37.2 C)  TempSrc: Oral Oral Oral Oral  Resp: 18 16  16   SpO2: 99% 95%  98%    Intake/Output from previous day: 03/06 0701 - 03/07 0700 In: 360 [P.O.:360] Out: 650 [Urine:650] Intake/Output this shift:    Physical Exam:  Able to lift knees slightly from bed. Iliopsoas and quadriceps 2 bilaterally, dorsiflexor and plantar flexor 4-4+ bilaterally.  CBC  Recent Labs  08/14/14 0745 08/15/14 0621  WBC 12.1* 10.7*  HGB 11.3* 11.5*  HCT 33.0* 33.9*  PLT 135* 140*   BMET  Recent Labs  08/14/14 0745 08/15/14 0621  NA 138 135  K 3.9 4.2  CL 105 105  CO2 26 23  GLUCOSE 259* 236*  BUN 30* 24*  CREATININE 1.40* 1.17  CALCIUM 7.9* 7.9*    Assessment/Plan: Neurologically without recovery. Continues on antibiotic therapy for probable discitis and psoas infection. Continue PT, resume OT.   Hewitt ShortsNUDELMAN,ROBERT W, MD 08/15/2014, 10:43 AM

## 2014-08-16 ENCOUNTER — Telehealth: Payer: Self-pay | Admitting: Clinical

## 2014-08-16 ENCOUNTER — Inpatient Hospital Stay (HOSPITAL_COMMUNITY): Payer: Medicare Other

## 2014-08-16 DIAGNOSIS — K6812 Psoas muscle abscess: Secondary | ICD-10-CM | POA: Insufficient documentation

## 2014-08-16 DIAGNOSIS — R7881 Bacteremia: Secondary | ICD-10-CM

## 2014-08-16 LAB — DIFFERENTIAL
BASOS ABS: 0 10*3/uL (ref 0.0–0.1)
Basophils Relative: 0 % (ref 0–1)
EOS PCT: 1 % (ref 0–5)
Eosinophils Absolute: 0.1 10*3/uL (ref 0.0–0.7)
Lymphocytes Relative: 7 % — ABNORMAL LOW (ref 12–46)
Lymphs Abs: 0.8 10*3/uL (ref 0.7–4.0)
Monocytes Absolute: 0.7 10*3/uL (ref 0.1–1.0)
Monocytes Relative: 7 % (ref 3–12)
Neutro Abs: 8.8 10*3/uL — ABNORMAL HIGH (ref 1.7–7.7)
Neutrophils Relative %: 85 % — ABNORMAL HIGH (ref 43–77)

## 2014-08-16 LAB — CBC
HCT: 31 % — ABNORMAL LOW (ref 39.0–52.0)
Hemoglobin: 10.6 g/dL — ABNORMAL LOW (ref 13.0–17.0)
MCH: 27.7 pg (ref 26.0–34.0)
MCHC: 34.2 g/dL (ref 30.0–36.0)
MCV: 80.9 fL (ref 78.0–100.0)
Platelets: 164 10*3/uL (ref 150–400)
RBC: 3.83 MIL/uL — ABNORMAL LOW (ref 4.22–5.81)
RDW: 17.2 % — AB (ref 11.5–15.5)
WBC: 10.6 10*3/uL — AB (ref 4.0–10.5)

## 2014-08-16 LAB — BASIC METABOLIC PANEL
ANION GAP: 4 — AB (ref 5–15)
BUN: 23 mg/dL (ref 6–23)
CHLORIDE: 106 mmol/L (ref 96–112)
CO2: 24 mmol/L (ref 19–32)
Calcium: 8 mg/dL — ABNORMAL LOW (ref 8.4–10.5)
Creatinine, Ser: 1.22 mg/dL (ref 0.50–1.35)
GFR calc non Af Amer: 55 mL/min — ABNORMAL LOW (ref >90)
GFR, EST AFRICAN AMERICAN: 63 mL/min — AB (ref >90)
Glucose, Bld: 264 mg/dL — ABNORMAL HIGH (ref 70–99)
Potassium: 3.9 mmol/L (ref 3.5–5.1)
Sodium: 134 mmol/L — ABNORMAL LOW (ref 135–145)

## 2014-08-16 LAB — CULTURE, BLOOD (ROUTINE X 2)

## 2014-08-16 LAB — GLUCOSE, CAPILLARY
GLUCOSE-CAPILLARY: 262 mg/dL — AB (ref 70–99)
GLUCOSE-CAPILLARY: 204 mg/dL — AB (ref 70–99)
Glucose-Capillary: 207 mg/dL — ABNORMAL HIGH (ref 70–99)
Glucose-Capillary: 256 mg/dL — ABNORMAL HIGH (ref 70–99)

## 2014-08-16 LAB — HIV ANTIBODY (ROUTINE TESTING W REFLEX): HIV SCREEN 4TH GENERATION: NONREACTIVE

## 2014-08-16 LAB — HEPATITIS C ANTIBODY: HCV AB: NEGATIVE

## 2014-08-16 MED ORDER — INSULIN GLARGINE 100 UNIT/ML ~~LOC~~ SOLN
10.0000 [IU] | Freq: Every day | SUBCUTANEOUS | Status: DC
  Administered 2014-08-16: 10 [IU] via SUBCUTANEOUS
  Filled 2014-08-16 (×2): qty 0.1

## 2014-08-16 NOTE — Plan of Care (Signed)
Problem: Consults Goal: Diagnosis - Spinal Surgery Outcome: Completed/Met Date Met:  08/16/14 Lumbar Laminectomy (Complex) L2-3

## 2014-08-16 NOTE — Progress Notes (Signed)
Echocardiogram 2D Echocardiogram has been performed.  Carlos Andrews, Carlos Andrews M 08/16/2014, 12:47 PM

## 2014-08-16 NOTE — Progress Notes (Signed)
Physical Therapy Treatment Patient Details Name: Carlos Andrews MRN: [id number] DOB: 13-Jan-1933 Today's Date: 08/16/2014    History of Present Illness 81 yo male with history of frequent falls at home, weakness of legs progressively worsening over past 2 months.  Hx lumbar senosis.  Unable to walk, pain "across back and down legs". Evidence of infection including psoas; s/p lumbar laminectomy    PT Comments    Noting some strength return bilateral hip flexors/extensors and quadriceps compared to last session; Needs continued work on trunk extension, LE strength, sitting balance, and weight shifts in prep for more independence with transfers;   Lives with wife, who can provide supervision (not sure how much physical assist she can provide); Given Mr. Dimond' excellent participation despite pain, and physical rehabilitation needs, I feel it is worth considering CIR stay to maximize independence and safety with mobility; will place CIR screen;   RN had given pain meds with water during session, and noted pt had difficulty with swallowing, "wet" vocal quality after, and need to clear his throat multiple times; Notified resident, and requested Speech Therapy consult for swallow eval;   Follow Up Recommendations  CIR     Equipment Recommendations  Rolling walker with 5" wheels;3in1 (PT);Wheelchair (measurements PT);Wheelchair cushion (measurements PT);Other (comment) (drop-arm BSC, sliding board)    Recommendations for Other Services OT consult     Precautions / Restrictions Precautions Precautions: Fall    Mobility  Bed Mobility Overal bed mobility: Needs Assistance Bed Mobility: Supine to Sit     Supine to sit: Max assist;HOB elevated     General bed mobility comments: Cues for technqiue and mod assist to move LEs to EOB and clear from EOB; Used bed pad to square off hip sat EOB; difficulty gaining balance at EOB as pt has significant upper and lower trunk flexion leading to  posterior pelvic tilt and significant posterior lean  Transfers Overall transfer level: Needs assistance Equipment used: 2 person hand held assist Transfers: Squat Pivot Transfers     Squat pivot transfers: +2 physical assistance;Max assist     General transfer comment: REquired bil knees blocked and used bed pad to support hips while transitioning to chair  Ambulation/Gait                 Stairs            Wheelchair Mobility    Modified Rankin (Stroke Patients Only)       Balance Overall balance assessment: Needs assistance Sitting-balance support: Bilateral upper extremity supported Sitting balance-Leahy Scale: Zero (requiring assist to prevent posterior loss of balane) Sitting balance - Comments:  Tending to sit with posterior pelvic tile lending to significant posterior lean and requiring support/assist to prevent loss of balance posteriorly;   Bulk of work this session was on sitting trunk extension and anterior pelvic tilt, and anterior weight shift for upright sitting and sitting balance;   there is a componenet of fear of falling anteriorly;   PT sat in front of pt (in hopes to somewhat allay that fear), and worked on reaching forward; pain was a limiting factor as well; once holding to stabilize something in front of him (PT's knees), able to hold unsupported sitting statically; Resident present for a portion of session Postural control: Posterior lean Standing balance support: Bilateral upper extremity supported Standing balance-Leahy Scale: Zero                      Cognition Arousal/Alertness: Awake/alert Behavior  During Therapy: WFL for tasks assessed/performed;Flat affect Overall Cognitive Status: Within Functional Limits for tasks assessed                      Exercises General Exercises - Lower Extremity Ankle Circles/Pumps: AROM;Both;20 reps (Encouraged pt to work on dorsiflexion all day) Heel Slides: AAROM;Both;10  reps;Supine;Limitations (Alternating) Heel Slides Limitations: Noting some strength return in hip flexors/extensors and quadriceps bilaterally    General Comments General comments (skin integrity, edema, etc.): RN had given pain meds with water during session, and noted pt had difficulty with swallowing, "wet" vocal quality after, and need to clear his throat multiple times; Notified resident, and requested Speech Therapy consult for swallow eval      Pertinent Vitals/Pain Pain Assessment: 0-10 Pain Score: 10-Worst pain ever Pain Location: back pain Pain Descriptors / Indicators: Grimacing;Aching;Constant (no crying observed) Pain Intervention(s): Repositioned;RN gave pain meds during session    Home Living                      Prior Function            PT Goals (current goals can now be found in the care plan section) Acute Rehab PT Goals Patient Stated Goal: be able to go home with spouse PT Goal Formulation: With patient Time For Goal Achievement: 08/27/14 Potential to Achieve Goals: Good Progress towards PT goals: Progressing toward goals    Frequency  Min 5X/week    PT Plan Discharge plan needs to be updated;Frequency needs to be updated    Co-evaluation             End of Session Equipment Utilized During Treatment: Gait belt (bed pad) Activity Tolerance: Patient limited by pain;Patient tolerated treatment well Patient left: in chair;with call bell/phone within reach     Time: 0854-0930 PT Time Calculation (min) (ACUTE ONLY): 36 min  Charges:  $Therapeutic Exercise: 8-22 mins $Therapeutic Activity: 8-22 mins                    G Codes:      Van ClinesGarrigan, Gari Hartsell [facility] Centeramff 08/16/2014, 9:53 AM  Van ClinesHolly Hilmar Moldovan, PT  Acute Rehabilitation Services Pager [phone] Office [phone]

## 2014-08-16 NOTE — Progress Notes (Signed)
ANTIBIOTIC CONSULT NOTE   Pharmacy Consult for Ceftriaxone Indication: osteomyelitis, paraspinal infection  Allergies  Allergen Reactions  . Naprosyn [Naproxen] Other (See Comments)    GI upset  . Rofecoxib     REACTION: dizzy  . Tramadol Hcl     REACTION: vomiting    Patient Measurements:    Height = 68 inches Weight = 75 kg  Vital Signs: Temp: 98.5 F (36.9 C) (03/08 0500) Temp Source: Oral (03/08 0500) BP: 134/58 mmHg (03/08 0500) Pulse Rate: 92 (03/08 0500) Intake/Output from previous day: 03/07 0701 - 03/08 0700 In: 600 [P.O.:600] Out: 500 [Urine:500] Intake/Output from this shift:    Labs:  Recent Labs  08/14/14 0745 08/15/14 0621 08/16/14 0606  WBC 12.1* 10.7* 10.6*  HGB 11.3* 11.5* 10.6*  PLT 135* 140* 164  CREATININE 1.40* 1.17 1.22   Estimated Creatinine Clearance: 47.5 mL/min (by C-G formula based on Cr of 1.22).    Microbiology: Recent Results (from the past 720 hour(s))  Culture, blood (routine x 2)     Status: None (Preliminary result)   Collection Time: 08/13/14  2:05 PM  Result Value Ref Range Status   Specimen Description BLOOD LEFT ARM  Final   Special Requests BOTTLES DRAWN AEROBIC AND ANAEROBIC 10 CC  Final   Culture   Final    GRAM POSITIVE COCCI IN PAIRS AND CHAINS Note: Gram Stain Report Called to,Read Back By and Verified With: TRANACE DAYE @ 1110 ON [id number] BY [facility] Performed at [facility]    Report Status PENDING  Incomplete  Culture, blood (routine x 2)     Status: None (Preliminary result)   Collection Time: 08/13/14  2:15 PM  Result Value Ref Range Status   Specimen Description BLOOD LEFT HAND  Final   Special Requests BOTTLES DRAWN AEROBIC ONLY 10 CC  Final   Culture   Final    GRAM POSITIVE COCCI IN PAIRS AND CHAINS Note: Gram Stain Report Called to,Read Back By and Verified With: TRANACE DAYE @ 1238 ON [id number] BY [facility] Performed at [facility]    Report Status PENDING  Incomplete  Culture, Urine      Status: None   Collection Time: 08/13/14  2:16 PM  Result Value Ref Range Status   Specimen Description URINE, CATHETERIZED  Final   Special Requests NONE  Final   Colony Count NO GROWTH Performed at [facility]   Final   Culture NO GROWTH Performed at [facility]   Final   Report Status 08/15/2014 FINAL  Final  Anaerobic culture     Status: None (Preliminary result)   Collection Time: 08/13/14  6:13 PM  Result Value Ref Range Status   Specimen Description WOUND  Final   Special Requests LUMBAR  Final   Gram Stain   Final    NO WBC SEEN NO SQUAMOUS EPITHELIAL CELLS SEEN NO ORGANISMS SEEN Performed at [facility]    Culture   Final    NO ANAEROBES ISOLATED; CULTURE IN PROGRESS FOR 5 DAYS Performed at [facility]    Report Status PENDING  Incomplete  Gram stain     Status: None   Collection Time: 08/13/14  6:13 PM  Result Value Ref Range Status   Specimen Description WOUND  Final   Special Requests LUMBAR  Final   Gram Stain   Final    NO WBC SEEN NO SQUAMOUS EPITHELIAL CELLS SEEN NO ORGANISMS SEEN Performed at [facility]  Report Status 08/13/2014 FINAL  Final  Wound culture     Status: None   Collection Time: 08/13/14  6:13 PM  Result Value Ref Range Status   Specimen Description WOUND  Final   Special Requests LUMBAR  Final   Gram Stain   Final    NO WBC SEEN NO SQUAMOUS EPITHELIAL CELLS SEEN NO ORGANISMS SEEN Performed at [facility]    Culture   Final    FEW MICROAEROPHILIC STREPTOCOCCI Note: Standardized susceptibility testing for this organism is not available. Performed at [facility]    Report Status 08/15/2014 FINAL  Final  Culture, blood (routine x 2)     Status: None (Preliminary result)   Collection Time: 08/15/14  1:56 PM  Result Value Ref Range Status   Specimen Description BLOOD RIGHT ARM  Final   Special Requests BOTTLES DRAWN AEROBIC AND ANAEROBIC 5CC  Final   Culture    Final           BLOOD CULTURE RECEIVED NO GROWTH TO DATE CULTURE WILL BE HELD FOR 5 DAYS BEFORE ISSUING A FINAL NEGATIVE REPORT Performed at [facility]    Report Status PENDING  Incomplete  Culture, blood (routine x 2)     Status: None (Preliminary result)   Collection Time: 08/15/14  2:06 PM  Result Value Ref Range Status   Specimen Description BLOOD RIGHT ARM  Final   Special Requests BOTTLES DRAWN AEROBIC ONLY 1CC  Final   Culture   Final           BLOOD CULTURE RECEIVED NO GROWTH TO DATE CULTURE WILL BE HELD FOR 5 DAYS BEFORE ISSUING A FINAL NEGATIVE REPORT Performed at [facility]    Report Status PENDING  Incomplete    Medical History: Past Medical History  Diagnosis Date  . DIABETES MELLITUS, II, COMPLICATIONS 08/07/2006  . Esophageal perforation 1993    managed at [facility]  . PPD positive 1993    Found at [facility]  . Spinal stenosis of lumbar region at multiple levels   . Proteinuria 08/07/2006    Qualifier: Diagnosis of  By: Haydee Salter    . Open angle with borderline findings and low glaucoma risk in both eyes 02/23/2013  . Regular astigmatism 02/23/2013  . Presbyopia 02/23/2013  . Ptosis of left eyelid 02/23/2013  . At risk for falling 10/04/2013  . GAIT DISTURBANCE 08/01/2008        . HYPERLIPIDEMIA 08/07/2006    Qualifier: Diagnosis of  By: Haydee Salter    . HYPERTENSION, BENIGN SYSTEMIC 08/07/2006    Qualifier: Diagnosis of  By: Haydee Salter    . IMPOTENCE, ORGANIC 08/07/2006    Qualifier: Diagnosis of  By: Haydee Salter    . LUMBAR SPRAIN AND STRAIN 07/19/2008    Qualifier: Diagnosis of  By: Mauricio Po MD, Fayrene Fearing    . Myopia with presbyopia of left eye 02/23/2013  . OBESITY, NOS 08/07/2006    Qualifier: Diagnosis of  By: Haydee Salter    . OSTEOARTHRITIS, LOWER LEG 08/07/2006    Qualifier: Diagnosis of  By: Haydee Salter    . PERIPHERAL EDEMA 08/01/2008    Qualifier: Diagnosis of  By: Sheffield Slider MD, Deniece Portela    . Peripheral vascular disease, unspecified  04/21/2012    Severe tibial occlusive disease (Dr Hart Rochester, Vasc Surgery, 04/2002)     . Recurrent falls 10/04/2013  . Skin ulcer of foot, left heel 02/23/2013  . Type II or unspecified type diabetes mellitus without mention of  complication, not stated as uncontrolled 08/07/2006    Qualifier: Diagnosis of  By: Haydee Salter    . Unspecified hereditary and idiopathic peripheral neuropathy 08/07/2006        . Proteinuria 08/07/2006  . Impairment of balance 05/10/2014  . Urge incontinence 05/10/2014  . Impaired mobility and ADLs 05/10/2014    Medications:  Scheduled:  . acetaminophen  500 mg Oral Q6H  . aspirin EC  81 mg Oral Daily  . calcium-vitamin D  1 tablet Oral Daily  . cefTRIAXone (ROCEPHIN)  IV  2 g Intravenous Q24H  . gabapentin  300 mg Oral BID  . insulin aspart  0-15 Units Subcutaneous TID WC  . insulin glargine  10 Units Subcutaneous QHS  . multivitamin with minerals  1 tablet Oral Daily  . pyridOXINE  100 mg Oral TID  . senna-docusate  2 tablet Oral BID  . sertraline  25 mg Oral Daily  . vitamin B-12  1,000 mcg Oral Daily  . 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  Currently on ceftriaxone 2gm IV q24 hours  Goal of Therapy:  Treatment of infection  Plan:  Cont ceftriaxone 2g IV q24 Monitor WBC, renal fxn F/U culture data and plans for abx therapy.  Thanks for allowing pharmacy to be a part of this patient's care.  Talbert Cage, PharmD Clinical Pharmacist, [phone]   08/16/2014 10:25 AM

## 2014-08-16 NOTE — Progress Notes (Signed)
Family Medicine Teaching Service Daily Progress Note Intern Pager: [phone]  Patient name: Carlos Andrews Medical record number: [id number] Date of birth: Mar 29, 1933 Age: 81 y.o. Gender: male  Primary Care Provider: MCDIARMID,TODD D, MD Consultants: None Code Status: DNR  Pt Overview and Major Events to Date:  3/4: Patient admitted for back pain and weakness 3/5: L2-L3 Laminectomy performed 3/6: Blood cultures positive for gram + cocci in pairs and chains  Assessment and Plan: Carlos Andrews is a 81 y.o. male presenting with bilateral lower extremity weakness. PMH is significant for T2DM, HTN, and recurrent falls.  Bacteremia: On admission leukocytosis 16.2>10.6 today. Blood cultures growing gram+ cocci in pairs and chains. Lactic acid 2.3. Possible source of infection discitis and psosas infection. No evidence of infection or purulence found in epidural space. Hx of spinal osteomyelitis.   Patient febrile overnight on Abx. - trend CBC -urine and wound culture no growth -continue antibiotic ceftriaxone only; discontinued Vanc - possibly causing drug fever -f/u culture sensitivites -ID consulted; appreciate recs  -Patient will need long term Abx - 6wk IV Ceftriaxone 2g daily - per ID -f/u redrawn blood cultures; schedule PICC after 48hrs no growth - looking for resolution of bacteremia -2D ECHO ordered to look for endocarditis - will await results  Lower extremity weakness: DDD with L2-3 disc buldge s/p laminectomy.Persistent. Patient not ambulatory at all. Uses wheelchair at home. - PT/OT consulted - recommend SNF; awaiting placement - CSW consulted - fall precautions  Back Pain: DG lumbar and thoracic significant for degenerative spondylosis. Chronic pain. Hx of spinal stenosis of lumbar region. -neurosurgery was consulted after MRI showing moderate to severe lumbar stenosis with suspected infection -now s/p lumbar laminectomy POD #2 -Pain management: morphine q3 prn, oxycodone q4  prn, and tylenol scheduled q6 -bowel regimen started senokot 2 tabs BID  Cough: Developed after patient swallowed medications with water. Possible risk of aspiritation. Lungs clear.  - SLP consulted to evaluate speech and swallow - concern for aspiration - consider repeat CXR if continued fevers or worsening symtpoms  CKD stage 3: Unsure of baseline but last Cr 1.2 in 2014. On admission 1.54. Elevation possibly due to volume depletion vs new baseline. UA with >1000 glucose and ketones no acidosis to suggest DKA at this time. S/p fluid bolus and MIVF. Creatinine stable. - Cr today 1.2 at baseline - encourage PO intake - trend BMET - avoid nephrotoxic agents  Depression: Patient stating her no longer feels like being here. Feels as though he is a burden. Thinks he will not survive this. Spirits better this morning.  -start medication Sertraline -spiritual consult placed  Hypocalcemia: corrected to 10.1 due to low albumin  HTN: Normotensive BPs. -Will hold home Lisinopril.  T2DM: Last A1c 5.4 (09/2013). Patient takes Amaryl at home. Fasting CBG of 296 on 3/5. CBGs 200-300. Elevated sugars likely due to infection.  - A1c 5.5 - Holding Amaryl at this time  - continue moderate SSI  - for better control will add long acting insulin Lantus 10U  History of left heel pressure ulcer - heel pads  Elevated troponin: no chest pain. Heart score: 3. EKG with non-specific t-wave changes that are unchanged from EKG seven days ago. Likely due to CKD 3 with AKI. - elevated troponins - cardiology believes this to be baseline for patient - EKG with sinus rhythm  FEN/GI: Carb modified diet/ SLIV Prophylaxis: Hep Subq  Disposition: Continue current management as above; pending placement and pain control.  Subjective:  Continues to  have pain in low back.  Is working with PT this morning and appears to have better mobility and ROM.   Objective: Temp:  [98.5 F (36.9 C)-102.6 F (39.2 C)] 98.5 F  (36.9 C) (03/08 0500) Pulse Rate:  [92-105] 92 (03/08 0500) Resp:  [18] 18 (03/08 0500) BP: (124-134)/(58-80) 134/58 mmHg (03/08 0500) SpO2:  [96 %-100 %] 97 % (03/08 0500)  Physical Exam: General: Fair appearing, appears chronically ill, mild distress Cardiovascular: Regular rate and rhythm, no murmur Respiratory: Clear to auscultation bilaterally Abdomen: Soft, non-tender, non-distended, reducible midline hernia Extremities: WWP, improved ROM Neuro: Persistent weakness BLE   Laboratory:  Recent Labs Lab 08/14/14 0745 08/15/14 0621 08/16/14 0606  WBC 12.1* 10.7* 10.6*  HGB 11.3* 11.5* 10.6*  HCT 33.0* 33.9* 31.0*  PLT 135* 140* 164    Recent Labs Lab 08/12/14 1658  08/14/14 0745 08/15/14 0621 08/16/14 0606  NA 139  < > 138 135 134*  K 4.2  < > 3.9 4.2 3.9  CL 105  < > 105 105 106  CO2 24  < > 26 23 24   BUN 26*  < > 30* 24* 23  CREATININE 1.54*  < > 1.40* 1.17 1.22  CALCIUM 8.5  < > 7.9* 7.9* 8.0*  PROT 5.9*  --   --   --   --   BILITOT 2.4*  --   --   --   --   ALKPHOS 104  --   --   --   --   ALT 48  --   --   --   --   AST 22  --   --   --   --   GLUCOSE 350*  < > 259* 236* 264*  < > = values in this interval not displayed.   Imaging/Diagnostic Tests: Dg Chest 2 View 08/12/2014   IMPRESSION: Mild cardiomegaly and aortic atherosclerosis.  No acute findings.     Dg Thoracic Spine 2 View 08/12/2014  IMPRESSION: No acute findings.  Degenerative spondylosis, as described above.    Dg Lumbar Spine Complete 08/12/2014    lower lumbar spine and lumbosacral junction. No acute vertebral body height loss. Aortic and branch vessel atherosclerosis.  IMPRESSION: Advanced lumbar spondylosis, without acute superimposed process.    Mr Thoracic Spine Wo Contrast 08/13/2014  IMPRESSION: 1. No evidence of active infection in the thoracic spine. 2. Appearance of the upper thoracic spine as above likely reflecting remote history of osteomyelitis and fusion. 3. Mild thoracic  spondylosis without evidence of significant stenosis. 4. Gastric pull-through versus patulous thoracic esophagus.     Mr Lumbar Spine Wo Contrast 08/13/2014   ADDENDUM REPORT: 08/13/2014 12:42  ADDENDUM: These results were called by telephone at the time of interpretation on 08/13/2014 at 12:41 pm to Dr. Jaquita RectorMelancon, who verbally acknowledged these results.     IMPRESSION: 1. Compared with the prior MRI from 02/17/2009, there are significantly progressive changes at the L2-3 disc space level with progressive disc bulging and discal T2 hyperintensity. These findings compress the thecal sac, resulting in severe spinal and biforaminal stenosis. Although the discal hyperintensity is new, there is no progressive bone destruction to suggest osteomyelitis. However, there is apparently a history of spinal osteomyelitis, and new T2 hyperintensity is demonstrated within the right psoas muscle such that disc space and paraspinal infection cannot be completely excluded. 2. Chronic spondylosis at L3-4, L4-5 and L5-S1. There is mildly progressive spinal stenosis at L3-4 without definite acute findings. 3. These results  will be called to the ordering clinician or representative by the Radiologist Assistant, and communication documented in the PACS or zVision Dashboard.    Pincus Large, DO 08/16/2014, 8:05 AM PGY-1, [facility] [place] [facility] FPTS Intern pager: [phone], text pages welcome

## 2014-08-16 NOTE — Progress Notes (Signed)
Subjective: Patient with fever to 102.6 overnight, now afebrile. Not seen by PT yesterday, nursing staff reports that they've elected to work with him only every other day, despite his significant neurologic deficit, needs for assistance with mobility including transfers and ambulation, and his significant potential for recovery.  Patient reports less discomfort. Laboratories stable this morning.  Seen by Dr. Drue SecondSnider from infectious disease yesterday, who continued ceftriaxone, but discontinued vancomycin. No further identification from blood cultures from March 5.  Blood cultures repeated yesterday, and so far show no growth.  Wound culture from surgery describes a few microaerophilic streptococci, apparently sensitivities can't be done.  Objective: Vital signs in last 24 hours: Filed Vitals:   08/15/14 2200 08/15/14 2213 08/16/14 0056 08/16/14 0500  BP:    134/58  Pulse:    92  Temp: 101.2 F (38.4 C) 102.6 F (39.2 C) 100 F (37.8 C) 98.5 F (36.9 C)  TempSrc: Oral Rectal Rectal Oral  Resp:    18  SpO2:    97%    Intake/Output from previous day: 03/07 0701 - 03/08 0700 In: 600 [P.O.:600] Out: 500 [Urine:500] Intake/Output this shift:    Physical Exam:  Awake alert, following commands. Much less discomfort with moving in bed such as when the head of bed is raised and lowered, as well as when the patient is turned from side to side in bed. Dressing removed, wound clean and dry, no erythema, swelling, or drainage. Wound left open to air.  Better motor function this morning. Better able to lift knees from the bed. Iliopsoas on the left 2-3, and on the right 2, quadriceps 2-3 bilaterally, dorsiflexor and plantar flexor 4-4+ bilaterally.  CBC  Recent Labs  08/15/14 0621 08/16/14 0606  WBC 10.7* 10.6*  HGB 11.5* 10.6*  HCT 33.9* 31.0*  PLT 140* 164   BMET  Recent Labs  08/15/14 0621 08/16/14 0606  NA 135 134*  K 4.2 3.9  CL 105 106  CO2 23 24  GLUCOSE 236* 264*  BUN  24* 23  CREATININE 1.17 1.22  CALCIUM 7.9* 8.0*    Assessment/Plan: Neurologically better today. Continues on ceftriaxone for bacteremia, and probable discitis and psoas infection. PT and OT both ordered, not seen by PT yesterday, not seen by OT since prior to surgery. Rehabilitation clearly critical at this time, as his medical condition has progressively stabilized. Certainly needs PT and OT daily.   Hewitt ShortsNUDELMAN,ROBERT W, MD 08/16/2014, 8:14 AM

## 2014-08-16 NOTE — Evaluation (Signed)
Clinical/Bedside Swallow Evaluation Patient Details  Name: Carlos Andrews MRN: [id number] Date of Birth: 05/12/1933  Today's Date: 08/16/2014 Time: SLP Start Time (ACUTE ONLY): 1110 SLP Stop Time (ACUTE ONLY): 1128 SLP Time Calculation (min) (ACUTE ONLY): 18 min  Past Medical History:  Past Medical History  Diagnosis Date  . DIABETES MELLITUS, II, COMPLICATIONS 08/07/2006  . Esophageal perforation 1993    [facility]  . [facility] positive 1993    [facility]  . Spinal stenosis of lumbar region at multiple levels   . Proteinuria 08/07/2006    Qualifier: Diagnosis of  By: Haydee SalterKivett, Whitney    . Open angle with borderline findings and low glaucoma risk in both eyes 02/23/2013  . Regular astigmatism 02/23/2013  . Presbyopia 02/23/2013  . Ptosis of left eyelid 02/23/2013  . At risk for falling 10/04/2013  . GAIT DISTURBANCE 08/01/2008        . HYPERLIPIDEMIA 08/07/2006    Qualifier: Diagnosis of  By: Haydee SalterKivett, Whitney    . HYPERTENSION, BENIGN SYSTEMIC 08/07/2006    Qualifier: Diagnosis of  By: Haydee SalterKivett, Whitney    . IMPOTENCE, ORGANIC 08/07/2006    Qualifier: Diagnosis of  By: Haydee SalterKivett, Whitney    . LUMBAR SPRAIN AND STRAIN 07/19/2008    Qualifier: Diagnosis of  By: Mauricio PoBreen MD, Fayrene FearingJames    . Myopia with presbyopia of left eye 02/23/2013  . OBESITY, NOS 08/07/2006    Qualifier: Diagnosis of  By: Haydee SalterKivett, Whitney    . OSTEOARTHRITIS, LOWER LEG 08/07/2006    Qualifier: Diagnosis of  By: Haydee SalterKivett, Whitney    . PERIPHERAL EDEMA 08/01/2008    Qualifier: Diagnosis of  By: Sheffield SliderHale MD, Deniece PortelaWayne    . Peripheral vascular disease, unspecified 04/21/2012    Severe tibial occlusive disease (Dr Hart RochesterLawson, Vasc Surgery, 04/2002)     . Recurrent falls 10/04/2013  . Skin ulcer of foot, left heel 02/23/2013  . Type II or unspecified type diabetes mellitus without mention of complication, not stated as uncontrolled 08/07/2006    Qualifier: Diagnosis of  By: Haydee SalterKivett, Whitney    . Unspecified hereditary and idiopathic peripheral neuropathy  08/07/2006        . Proteinuria 08/07/2006  . Impairment of balance 05/10/2014  . Urge incontinence 05/10/2014  . Impaired mobility and ADLs 05/10/2014   Past Surgical History:  Past Surgical History  Procedure Laterality Date  . Neck surgery  1993    s/p C1-2, C2-3 anterior cervical diskectomies, [facility]  . Cataract extraction      Cataract surgery   . Thoracotomy  1993    s/p T3-T4 osteomyelitis with multiple open drainages [facility]  . Spinal cord decompression  1993    s/p T1-T2 spinal cord compression secondary to granualtion tissue and abscess material secondary to [facility]  . Jejunostomy feeding tube  1993    [facility]  . Esophageal perforation  1993    Hx of esophageal perforation complication 1993  . Lumbar epidural injection  2010    Epidural corticosteroid injection for L2-3 & L4-5 severe spinal stenosis [facility] on 9/10, 10/10, 11/10    . Lumbar laminectomy/decompression microdiscectomy N/A 08/13/2014    Procedure: LUMBAR LAMINECTOMY ;  Surgeon: Shirlean Kellyobert Nudelman, MD;  Location: [facility] NEURO ORS;  Service: Neurosurgery;  Laterality: N/A;   HPI:  81 yo male with history of frequent falls at home, weakness of legs progressively worsening over past  2 months. Hx lumbar senosis. Unable to walk, pain "across back and down legs". Evidence of infection including psoas; s/p lumbar laminectomy. Pt reports coughing with PO intake.   Assessment / Plan / Recommendation Clinical Impression  Pt has multiple audible swallows, wet vocal quality, and frequent throat clearing with PO intake including thin liquids, pureed solids, and mixed consistency boluses with medication administration by RN. Signs of suspected penetration/aspiration were reduced but not eliminated when taking medication whole with purees. Unfortunately, pt became quickly lethargic as pain  medications set in and therefore history is limited - he does however report that he has had frequent coughing at home with intake but does not recall having PNA previously. Given the above as well as new onset fever, recommending proceeding with MBS to assess aspiration risk and determine least restrictive diet.     Aspiration Risk  Moderate    Diet Recommendation NPO except meds   Medication Administration: Whole meds with puree    Other  Recommendations Recommended Consults: MBS Oral Care Recommendations: Oral care Q4 per protocol   Follow Up Recommendations   (tba pending objective testing)    Frequency and Duration        Pertinent Vitals/Pain RN present and providing pain medication    SLP Swallow Goals     Swallow Study Prior Functional Status       General HPI: 81 yo male with history of frequent falls at home, weakness of legs progressively worsening over past 2 months. Hx lumbar senosis. Unable to walk, pain "across back and down legs". Evidence of infection including psoas; s/p lumbar laminectomy. Pt reports coughing with PO intake. Type of Study: Bedside swallow evaluation Previous Swallow Assessment: none in chart Diet Prior to this Study: Regular;Thin liquids Temperature Spikes Noted: Yes Respiratory Status: Room air History of Recent Intubation: No Behavior/Cognition: Alert;Cooperative;Pleasant mood;Other (comment) (quickyl became lethargic with pain meds) Oral Cavity - Dentition: Adequate natural dentition Patient Positioning: Upright in chair Baseline Vocal Quality: Low vocal intensity Volitional Cough: Strong Volitional Swallow: Able to elicit    Oral/Motor/Sensory Function Overall Oral Motor/Sensory Function: Appears within functional limits for tasks assessed   Ice Chips Ice chips: Not tested   Thin Liquid Thin Liquid: Impaired Presentation: Straw Pharyngeal  Phase Impairments: Suspected delayed Swallow;Multiple swallows;Wet Vocal Quality;Throat  Clearing - Immediate;Other (comments) (audible swallow)    Nectar Thick Nectar Thick Liquid: Not tested   Honey Thick Honey Thick Liquid: Not tested   Puree Puree: Impaired Presentation: Self Fed;Spoon Pharyngeal Phase Impairments: Multiple swallows;Throat Clearing - Delayed   Solid     Solid: Not tested      Maxcine Ham, M.A. CCC-SLP [phone]  Maxcine Ham 08/16/2014,11:42 AM

## 2014-08-16 NOTE — Progress Notes (Signed)
MBSS complete. Full report located under chart review in imaging section.  Micaiah Litle Paiewonsky, M.A. CCC-SLP [phone]  

## 2014-08-16 NOTE — Evaluation (Signed)
Occupational Therapy Evaluation Patient Details Name: Carlos SentersHerman R Andrews MRN: [id number] DOB: 03/08/1933 Today's Date: 08/16/2014    History of Present Illness 81 yo male with history of frequent falls at home, weakness of legs progressively worsening over past 2 months.  Hx lumbar senosis.  Unable to walk, pain "across back and down legs". Evidence of infection including psoas; s/p lumbar laminectomy   Clinical Impression   Pt s/p above. Pt reports requiring assist with ADLs, PTA. Feel pt will benefit from acute OT to increase independence and strength prior to d/c. Recommending SNF for rehab.    Follow Up Recommendations  SNF;Supervision/Assistance - 24 hour    Equipment Recommendations  Other (comment) (defer to next venue)    Recommendations for Other Services       Precautions / Restrictions Precautions Precautions: Fall;Back Precaution Booklet Issued: No Precaution Comments: educated on back precautions Restrictions Weight Bearing Restrictions: No      Mobility Bed Mobility Overal bed mobility: Needs Assistance Bed Mobility: Rolling;Sidelying to Sit;Sit to Sidelying Rolling: Max assist/Mod assist Sidelying to sit: Max assist     Sit to sidelying: Max assist General bed mobility comments: cues for technique. Assist with legs and trunk to come to sitting position. Attempted to return to sidelying, but was difficult.  Transfers Overall transfer level: Needs assistance Equipment used: Rolling walker (2 wheeled)   Sit to Stand: +2 physical assistance;From elevated surface (unable to stand with +1 assist)         General transfer comment: cues for technique. Cues and assist to position LE's. Unable to stand with +1 assist, but did attempt.    Balance Overall balance assessment: Needs assistance    Pt requiring Supervision-Max assist sitting EOB. Pt relying on UE support.                                       ADL Overall ADL's : Needs  assistance/impaired                     Lower Body Dressing: +2 for physical assistance;Maximal assistance;Sit to/from stand   Toilet Transfer: +2 for physical assistance;RW (unable to stand from bed with +1 assist)           Functional mobility during ADLs:  (did not ambulate in session) General ADL Comments: Pt sat EOB. Educated on AE.  Discussed d/c recommendation.     Vision     Perception     Praxis      Pertinent Vitals/Pain Pain Assessment: 0-10 Pain Score: 10-Worst pain ever Pain Location: back and legs Pain Intervention(s): Repositioned;Monitored during session     Hand Dominance     Extremity/Trunk Assessment Upper Extremity Assessment Upper Extremity Assessment: Generalized weakness   Lower Extremity Assessment Lower Extremity Assessment: Defer to PT evaluation       Communication Communication Communication: No difficulties   Cognition Arousal/Alertness: Awake/alert Behavior During Therapy: WFL for tasks assessed/performed;Flat affect Overall Cognitive Status: Within Functional Limits for tasks assessed                     General Comments       Exercises       Shoulder Instructions      Home Living Family/patient expects to be discharged to:: Unsure Living Arrangements: Spouse/significant other  Bathroom Shower/Tub: Chief Strategy Officer: Standard     Home Equipment: Bedside commode;Shower seat;Adaptive equipment Adaptive Equipment: Reacher Additional Comments: Spouse unable to care for patient at this time due to increased falls and pain.      Prior Functioning/Environment Level of Independence: Needs assistance    ADL's / Homemaking Assistance Needed: reports a lot of assist with LB dressing/bathing        OT Diagnosis: Generalized weakness;Acute pain   OT Problem List: Decreased strength;Decreased activity tolerance;Pain;Decreased knowledge of precautions;Decreased  knowledge of use of DME or AE;Impaired balance (sitting and/or standing)   OT Treatment/Interventions: Self-care/ADL training;Therapeutic exercise;DME and/or AE instruction;Therapeutic activities;Patient/family education;Balance training    OT Goals(Current goals can be found in the care plan section) Acute Rehab OT Goals Patient Stated Goal: to ambulate OT Goal Formulation: With patient Time For Goal Achievement: 08/23/14 Potential to Achieve Goals: Good ADL Goals Pt Will Perform Lower Body Bathing: with mod assist;sit to/from stand;with adaptive equipment Pt Will Perform Lower Body Dressing: with mod assist;with adaptive equipment;sit to/from stand Pt Will Transfer to Toilet: with mod assist;ambulating;bedside commode Pt Will Perform Toileting - Clothing Manipulation and hygiene: with mod assist;sit to/from stand Additional ADL Goal #1: Pt will perform bed mobility at Min A level as precursor for ADLs. Additional ADL Goal #2: Pt will independently perform UE exercises to increase strength.  OT Frequency: Min 2X/week   Barriers to D/C:            Co-evaluation              End of Session Equipment Utilized During Treatment: Gait belt;Rolling walker  Activity Tolerance: Patient limited by fatigue;Patient limited by pain Patient left: in bed;with call bell/phone within reach;with bed alarm set   Time: 1346-1405 OT Time Calculation (min): 19 min Charges:  OT General Charges $OT Visit: 1 Procedure OT Evaluation $Initial OT Evaluation Tier I: 1 Procedure G-CodesEarlie Raveling OTR/L [id number] 08/16/2014, 2:18 PM

## 2014-08-16 NOTE — Discharge Summary (Signed)
Family Medicine [facility] Discharge Summary  Patient name: Carlos SentersHerman R Kishimoto Medical record number: [id number] Date of birth: 08/26/1932 Age: 81 y.o. Gender: male Date of Admission: 08/12/2014  Date of Discharge: 08/17/2013 Admitting Physician: Uvaldo RisingKyle J Fletke, MD  Primary Care Provider: MCDIARMID,TODD D, MD Consultants: ID, Neurosurgery  Indication for Hospitalization: Lower extremity weakness  Discharge Diagnoses/Problem List:  Patient Active Problem List   Diagnosis Date Noted  . Psoas abscess   . Depression 08/15/2014  . Bacteremia 08/15/2014  . S/P laminectomy   . Leg weakness   . Lumbar radiculopathy, acute   . Type 2 diabetes mellitus with diabetic chronic kidney disease   . Essential hypertension   . Leukocytosis   . Frequent falls   . Weakness of both legs 08/12/2014  . Spinal stenosis of lumbar region 05/10/2014  . Impairment of balance 05/10/2014  . Thenar atrophy, bilateral, right greater than left.  05/10/2014  . Urge incontinence 05/10/2014  . Impaired mobility and ADLs 05/10/2014  . At risk for falling 10/04/2013  . Recurrent falls 10/04/2013  . Skin ulcer of foot, left heel 02/23/2013  . Myopia with presbyopia of left eye 02/23/2013  . Open angle with borderline findings and low glaucoma risk in both eyes 02/23/2013  . Regular astigmatism 02/23/2013  . Presbyopia 02/23/2013  . Ptosis of right eyelid 02/23/2013  . Peripheral vascular disease, unspecified 04/21/2012  . Atherosclerosis of native arteries of extremity with intermittent claudication 04/21/2012  . GAIT DISTURBANCE 08/01/2008  . LUMBAR SPRAIN AND STRAIN 07/19/2008  . KIDNEY DISEASE, CHRONIC, STAGE III 03/13/2007  . Diabetes mellitus due to underlying condition with diabetic polyneuropathy 08/07/2006  . HYPERLIPIDEMIA 08/07/2006  . OBESITY, NOS 08/07/2006  . Unspecified hereditary and idiopathic peripheral neuropathy 08/07/2006  . HYPERTENSION, BENIGN SYSTEMIC 08/07/2006  . 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 OSTEOARTHRITIS, LOWER LEG 08/07/2006  . Proteinuria 08/07/2006    Disposition: To SNF  Discharge Condition: Stable  Discharge Exam:  Filed Vitals:   08/18/14 0548  BP: 140/74  Pulse: 85  Temp: 98.6 F (37 C)  Resp:    General: Fair appearing, appears chronically ill, NAD Cardiovascular: Regular rate and rhythm, no murmur Respiratory: Clear to auscultation bilaterally Abdomen: Firm, non-tender, non-distended, reducible midline hernia Extremities: WWP, improved ROM Neuro: Persistent weakness BLE. Alert but not oriented.  Brief Hospital Course:  Carlos SentersHerman R Haegele is a 81 y.o. male who presented with bilateral lower extremity weakness. PMH is significant for T2DM, HTN, and recurrent falls.  Back Pain and lower extremity weakness: patient presented with worsening lower extremity weakness and pain. DG lumbar and thoracic significant for degenerative spondylosis. 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 MRI showing moderate to severe lumbar stenosis and bulge with suspected infection. Neurosurgery was also consulted due to patient have continued severe pain and decreased rectal tone. Patient was taken to have L2-L3 laminectomy. He had a leukocytosis and fevers. Found to have bacteremia on blood cultures. Final diagnosis is discitis, psaoas abscess, and bacteremia due to microaerophilic strep. He is getting Ceftriaxone 2gm IV through PICC line. He will continue this for 6 weeks. Started on 3/7 (1 of 42). He will need weekly cbc and bmp. ID was following patient and he will need to follow with them as an outpatient. PT/OT were consulted and suggest SNF for patient. While working with therapist patient had some improvement in mobility and lower extremity weakness.   CHF: Patient noted to have both systolic and diastolic HF. Echo performed  showed EF of 30-35% with akenisis of myocardium; aortic valve calcification with regurgitation. Results below. NYHA  score 3. Unable tos tart appropriate therapy while in the hospital due to unstable vitals. Consider medically treating as outpatient.   Depression: Patient stating her no longer feels like being alive. Feels as though he is a burden. Thinks he will not survive this. He was started on Sertraline.    T2DM: Patient takes Amaryl at home. A1c this admission 5.5. However during hospitalization CBGs 200-300. Elevated sugars likely due to infection and pain. Patient was placed on moderate SSI with HS coverage and Lantus for better control.  The patient's other chronic conditions were stable during this hospitalization and the patient was continued on home medications.    Issues for Follow Up:  1. Repeat CBC and BMP weekly while on Abx 2. Follow-up in ID clinic in 4-6 weeks 3. Consider treatment for CHF if HR and BP tolerate; aldactone, ICD 4. Follow-up on depressive symtpoms 5. BP medications held due soft/normotensive pressures. Add back as needed.  6. Dysphagia 3 diet 7. Monitor pain and adjust as needed 8. Monitor sugars; regimen will likely need to be adjusted  Significant Procedures: PICC placement  Significant Labs and Imaging:   Recent Labs Lab 08/15/14 0621 08/16/14 0606 08/17/14 0620  WBC 10.7* 10.6* 9.6  HGB 11.5* 10.6* 11.5*  HCT 33.9* 31.0* 34.5*  PLT 140* 164 194    Recent Labs Lab 08/12/14 1658 08/13/14 0423 08/14/14 0745 08/15/14 0621 08/16/14 0606  NA 139 135 138 135 134*  K 4.2 3.8 3.9 4.2 3.9  CL 105 103 105 105 106  CO2 GLUCOSE 350* 334* 259* 236* 264*  BUN 26* 29* 30* 24* 23  CREATININE 1.54* 1.56* 1.40* 1.17 1.22  CALCIUM 8.5 8.0* 7.9* 7.9* 8.0*  ALKPHOS 104  --   --   --   --   AST 22  --   --   --   --   ALT 48  --   --   --   --   ALBUMIN 2.4*  --   --   --   --    Echo(08/16/14) Study Conclusions  - Left ventricle: The cavity size was normal. Systolic function was moderately to severely reduced. The estimated ejection  fraction was in the range of 30% to 35%. There is akinesis of the basal-midinferoseptal myocardium. There is akinesis of the basal-midinferior myocardium. Cannot exclude akinesis of the basalanteroseptal myocardium. There was an increased relative contribution of atrial contraction to ventricular filling. Doppler parameters are consistent with abnormal left ventricular relaxation (grade 1 diastolic dysfunction). - Ventricular septum: Septal motion showed paradox. - Aortic valve: Mild thickening and calcification, consistent with sclerosis. There was mild to moderate regurgitation. - Aorta: Ascending aortic diameter: 43 mm (S). - Ascending aorta: The ascending aorta was mildly dilated. - Mitral valve: There was mild regurgitation.   Mr Lumbar Spine Wo Contrast 08/13/2014  IMPRESSION: 1. Compared with the prior MRI from 02/17/2009, there are significantly progressive changes at the L2-3 disc space level with progressive disc bulging and discal T2 hyperintensity. These findings compress the thecal sac, resulting in severe spinal and biforaminal stenosis. Although the discal hyperintensity is new, there is no progressive bone destruction to suggest osteomyelitis. However, there is apparently a history of spinal osteomyelitis, and new T2 hyperintensity is demonstrated within the right psoas muscle such that disc space and paraspinal infection cannot be completely excluded. 2. Chronic spondylosis  at L3-4, L4-5 and L5-S1. There is mildly progressive spinal stenosis at L3-4 without definite acute findings. 3. These results will be called to the ordering clinician or representative by the Radiologist Assistant, and communication documented in the PACS or zVision Dashboard.  Electronically Signed: By: Carey Bullocks M.D. On: 08/13/2014 12:07    Results/Tests Pending at Time of Discharge: None  Discharge Medications:    Medication List    STOP taking these medications         glimepiride 2 MG tablet  Commonly known as:  AMARYL      TAKE these medications        acetaminophen 325 MG tablet  Commonly known as:  TYLENOL  Take 650 mg by mouth every 6 (six) hours as needed for mild pain.     aspirin 81 MG tablet  Take 81 mg by mouth daily.     calcium-vitamin D 250-125 MG-UNIT per tablet  Commonly known as:  OSCAL WITH D  Take 1 tablet by mouth daily.     cefTRIAXone 40 MG/ML IVPB  Commonly known as:  ROCEPHIN  Inject 50 mLs (2 g total) into the vein daily.     Fish Oil Oil  Take 1,000 mg by mouth daily.     food thickener Powd  Commonly known as:  THICK IT  Take 1 Container by mouth as needed (Per Dsyphagia 3 Diet, to make liquids Nectar Thi\ck).     gabapentin 300 MG capsule  Commonly known as:  NEURONTIN  Take 1 capsule (300 mg total) by mouth 2 (two) times daily.     insulin aspart 100 UNIT/ML injection  Commonly known as:  NOVOLOG  Inject 3 Units into the skin 3 (three) times daily with meals.     insulin glargine 100 UNIT/ML injection  Commonly known as:  LANTUS  Inject 0.2 mLs (20 Units total) into the skin at bedtime.     lisinopril 20 MG tablet  Commonly known as:  PRINIVIL,ZESTRIL  Take 1 tablet (20 mg total) by mouth at bedtime.     multivitamin tablet  Take 1 tablet by mouth daily.     Oxycodone HCl 10 MG Tabs  Take 1 tablet (10 mg total) by mouth every 4 (four) hours as needed for moderate pain or severe pain.     pyridOXINE 100 MG tablet  Commonly known as:  VITAMIN B-6  3 tabs once daily.     senna-docusate 8.6-50 MG per tablet  Commonly known as:  Senokot-S  Take 2 tablets by mouth 2 (two) times daily.     vitamin B-12 1000 MCG tablet  Commonly known as:  CYANOCOBALAMIN  Take 1,000 mcg by mouth daily.     Vitamin C 500 MG Chew  Chew by mouth daily.        Discharge Instructions: Please refer to Patient Instructions section of EMR for full details.  Patient was counseled important signs and symptoms that should  prompt return to medical care, changes in medications, dietary instructions, activity restrictions, and follow up appointments.   Follow-Up Appointments: Follow-up Information    Follow up with Judyann Munson, MD. Schedule an appointment as soon as possible for a visit in 5 weeks.   Specialty:  Infectious Diseases   Contact information:   [address] [place] [address] [phone]       Pincus Large, DO 08/18/2014, 12:30 PM PGY-1, [facility]

## 2014-08-16 NOTE — Progress Notes (Signed)
Rectal temp 100.0 @ 0056. MD notified. MD stated to continue with scheduled tylenol. No new orders received at this time.

## 2014-08-16 NOTE — Progress Notes (Signed)
Received prescreen request from PT for inpatient rehab and have reviewed pt's case. Pt has [employer] Medicare and based on pt's current diagnosis of s/p Lumbar laminectomy and psoas infection, it is not likely that his insurance would give authorization for inpatient rehab. At this time, we are recommending that skilled nursing be pursued.   Thanks.  Juliann MuleJanine Meliah Appleman, PT Rehabilitation Admissions Coordinator [phone]

## 2014-08-16 NOTE — Telephone Encounter (Signed)
[facility] a call from pts son. CSW has worked up pt for SNF on Friday however CSW was unable to reach son to provide bed offers. CSW observed that pt is now in the hospital where SNF placement is being arranged from the inpatient CSW. CSW informed son that CSW would request to have the hospital social worker give him a call to confirm bed offers. Son Adult nurseappreciative.  CSW called and spoke to CSW Vickii PennaGina Ingle who will contact son today and provide bed offers. Theresia BoughNorma Tyree Vandruff, MSW, LCSW [phone]

## 2014-08-16 NOTE — Progress Notes (Signed)
    [facility]    Date of Admission:  08/12/2014   Total days of antibiotics 4        Day 4 vanco        Day 4 ceftriaxone           ID: Carlos Andrews is a 81 y.o. male with  discitis  Active Problems:   Weakness of both legs   Leg weakness   Lumbar radiculopathy, acute   Type 2 diabetes mellitus with diabetic chronic kidney disease   Essential hypertension   Leukocytosis   Frequent falls   S/P laminectomy   Depression   Bacteremia    Subjective: Had high fever of 102.6 last night, as well as night prior. Remains afebrile in the morning.   Medications:  . acetaminophen  500 mg Oral Q6H  . aspirin EC  81 mg Oral Daily  . calcium-vitamin D  1 tablet Oral Daily  . cefTRIAXone (ROCEPHIN)  IV  2 g Intravenous Q24H  . gabapentin  300 mg Oral BID  . insulin aspart  0-15 Units Subcutaneous TID WC  . insulin glargine  10 Units Subcutaneous QHS  . multivitamin with minerals  1 tablet Oral Daily  . pyridOXINE  100 mg Oral TID  . senna-docusate  2 tablet Oral BID  . sertraline  25 mg Oral Daily  . vitamin B-12  1,000 mcg Oral Daily  . vitamin C  500 mg Oral Daily    Objective: Vital signs in last 24 hours: Temp:  [98.5 F (36.9 C)-102.6 F (39.2 C)] 98.5 F (36.9 C) (03/08 0500) Pulse Rate:  [92-105] 92 (03/08 0500) Resp:  [18] 18 (03/08 0500) BP: (124-134)/(58-80) 134/58 mmHg (03/08 0500) SpO2:  [96 %-100 %] 97 % (03/08 0500)  Did not examine, away from room  Lab Results  Recent Labs  08/15/14 0621 08/16/14 0606  WBC 10.7* 10.6*  HGB 11.5* 10.6*  HCT 33.9* 31.0*  NA 135 134*  K 4.2 3.9  CL 105 106  CO2 23 24  BUN 24* 23  CREATININE 1.17 1.22   Liver Panel No results for input(s): PROT, ALBUMIN, AST, ALT, ALKPHOS, BILITOT, BILIDIR, IBILI in the last 72 hours. Sedimentation Rate  Recent Labs  08/13/14 2059  ESRSEDRATE 51*   C-Reactive Protein No results for input(s): CRP in the last 72 hours.  Microbiology: 3/7 blood cx  pending 3/5 tissue cx microaerophiilc strep 3/5 urine cx ngtd 3/5 blood cx gpc in prs Studies/Results: No results found.   Assessment/Plan: Fevers = will repeat blood cx if still febrile tomorrow.  Discitis, psoas m abscess, due to Microaerophilic strep = continue with high dose ceftriaxone 2gm iv daily which would treat infection. No need for sensitivities  gpc bacteremia = likely will be strep species  [facility], [facility] Cell: [phone] Pager: [phone]  08/16/2014, 9:36 AM

## 2014-08-17 ENCOUNTER — Inpatient Hospital Stay (HOSPITAL_COMMUNITY): Payer: Medicare Other

## 2014-08-17 DIAGNOSIS — K6812 Psoas muscle abscess: Secondary | ICD-10-CM

## 2014-08-17 DIAGNOSIS — F329 Major depressive disorder, single episode, unspecified: Secondary | ICD-10-CM

## 2014-08-17 DIAGNOSIS — R509 Fever, unspecified: Secondary | ICD-10-CM

## 2014-08-17 DIAGNOSIS — Z9889 Other specified postprocedural states: Secondary | ICD-10-CM

## 2014-08-17 DIAGNOSIS — M4640 Discitis, unspecified, site unspecified: Secondary | ICD-10-CM

## 2014-08-17 DIAGNOSIS — B954 Other streptococcus as the cause of diseases classified elsewhere: Secondary | ICD-10-CM

## 2014-08-17 LAB — GLUCOSE, CAPILLARY
Glucose-Capillary: 245 mg/dL — ABNORMAL HIGH (ref 70–99)
Glucose-Capillary: 195 mg/dL — ABNORMAL HIGH (ref 70–99)
Glucose-Capillary: 164 mg/dL — ABNORMAL HIGH (ref 70–99)
Glucose-Capillary: 192 mg/dL — ABNORMAL HIGH (ref 70–99)

## 2014-08-17 LAB — CBC WITH DIFFERENTIAL/PLATELET
BASOS ABS: 0 10*3/uL (ref 0.0–0.1)
Basophils Relative: 0 % (ref 0–1)
EOS PCT: 1 % (ref 0–5)
Eosinophils Absolute: 0.1 10*3/uL (ref 0.0–0.7)
HEMATOCRIT: 34.5 % — AB (ref 39.0–52.0)
Hemoglobin: 11.5 g/dL — ABNORMAL LOW (ref 13.0–17.0)
LYMPHS PCT: 9 % — AB (ref 12–46)
Lymphs Abs: 0.8 10*3/uL (ref 0.7–4.0)
MCH: 27.8 pg (ref 26.0–34.0)
MCHC: 33.3 g/dL (ref 30.0–36.0)
MCV: 83.5 fL (ref 78.0–100.0)
MONOS PCT: 5 % (ref 3–12)
Monocytes Absolute: 0.5 10*3/uL (ref 0.1–1.0)
NEUTROS PCT: 85 % — AB (ref 43–77)
Neutro Abs: 8.2 10*3/uL — ABNORMAL HIGH (ref 1.7–7.7)
Platelets: 194 10*3/uL (ref 150–400)
RBC: 4.13 MIL/uL — ABNORMAL LOW (ref 4.22–5.81)
RDW: 17.3 % — AB (ref 11.5–15.5)
WBC: 9.6 10*3/uL (ref 4.0–10.5)

## 2014-08-17 MED ORDER — POLYETHYLENE GLYCOL 3350 17 G PO PACK
17.0000 g | PACK | Freq: Two times a day (BID) | ORAL | Status: DC
  Administered 2014-08-17 – 2014-08-19 (×4): 17 g via ORAL
  Filled 2014-08-17 (×4): qty 1

## 2014-08-17 MED ORDER — INSULIN ASPART 100 UNIT/ML ~~LOC~~ SOLN: 0.0000 [IU] | Freq: Every day | SUBCUTANEOUS | Status: DC

## 2014-08-17 MED ORDER — SODIUM CHLORIDE 0.9 % IJ SOLN
10.0000 mL | INTRAMUSCULAR | Status: DC | PRN
  Administered 2014-08-18 – 2014-08-19 (×3): 10 mL
  Filled 2014-08-17 (×3): qty 40

## 2014-08-17 MED ORDER — STARCH (THICKENING) PO POWD
ORAL | Status: DC | PRN
  Filled 2014-08-17: qty 227

## 2014-08-17 MED ORDER — LIDOCAINE HCL 1 % IJ SOLN
INTRAMUSCULAR | Status: AC
  Filled 2014-08-17: qty 20

## 2014-08-17 MED ORDER — CHLORHEXIDINE GLUCONATE 4 % EX LIQD
CUTANEOUS | Status: AC
  Filled 2014-08-17: qty 15

## 2014-08-17 MED ORDER — INSULIN GLARGINE 100 UNIT/ML ~~LOC~~ SOLN
15.0000 [IU] | Freq: Every day | SUBCUTANEOUS | Status: DC
  Administered 2014-08-17: 15 [IU] via SUBCUTANEOUS
  Filled 2014-08-17 (×2): qty 0.15

## 2014-08-17 MED ORDER — SODIUM CHLORIDE 0.9 % IJ SOLN: 10.0000 mL | INTRAMUSCULAR | Status: DC | PRN

## 2014-08-17 MED ORDER — INSULIN ASPART 100 UNIT/ML ~~LOC~~ SOLN
0.0000 [IU] | Freq: Three times a day (TID) | SUBCUTANEOUS | Status: DC
  Administered 2014-08-17 – 2014-08-18 (×4): 3 [IU] via SUBCUTANEOUS
  Administered 2014-08-18: 5 [IU] via SUBCUTANEOUS
  Administered 2014-08-19 (×2): 2 [IU] via SUBCUTANEOUS

## 2014-08-17 NOTE — Progress Notes (Signed)
Speech Language Pathology Treatment: Dysphagia  Patient Details Name: Carlos SentersHerman R Andrews MRN: [id number] DOB: 03/18/1933 Today's Date: 08/17/2014 Time: [id number] SLP Time Calculation (min) (ACUTE ONLY): 12 min  Assessment / Plan / Recommendation Clinical Impression  Pt seen for f/u dysphagia treatment. Pt not as alert, not processing information as well, therefore speech/language evaluation deferred at this time due to pt's ability to adequately participate. Pt required Total A for recall of information from MBS on previous date. Two cup sips of nectar were administered as pt requires Max multimodal cueing, demonstrations, and extra time to utilize hard cough to clear penetrates, thus allowing increased time for penetrates to fall deeper into the airway. Pt consumed a few bites of soft solids without overt signs of aspiration, but current mentation impacts his safety. SLP to return for close monitoring of swallow function as well as cognitive/linguistic evaluation.   HPI HPI: 81 yo male with history of frequent falls at home, weakness of legs progressively worsening over past 2 months. Hx lumbar senosis. Unable to walk, pain "across back and down legs". Evidence of infection including psoas; s/p lumbar laminectomy. Pt reports coughing with PO intake.   Pertinent Vitals Pain Assessment: No/denies pain (no reported pain or overt indicators) Faces Pain Scale: Hurts worst Pain Location: low back and legs  Pain Descriptors / Indicators: Grimacing;Guarding Pain Intervention(s): Premedicated before session;Repositioned;Patient requesting pain meds-RN notified  SLP Plan  Continue with current plan of care    Recommendations Diet recommendations: Dysphagia 3 (mechanical soft);Nectar-thick liquid Liquids provided via: Cup;No straw Medication Administration: Whole meds with puree Supervision: Patient able to self feed;Full supervision/cueing for compensatory strategies Compensations: Slow rate;Small  sips/bites;Hard cough after swallow;Multiple dry swallows after each bite/sip Postural Changes and/or Swallow Maneuvers: Seated upright 90 degrees;Upright 30-60 min after meal       Oral Care Recommendations: Oral care BID Follow up Recommendations: Inpatient Rehab Plan: Continue with current plan of care    Carlos Andrews, Carlos Andrews [phone]  Carlos Andrews, Carlos Andrews 08/17/2014, 3:26 PM

## 2014-08-17 NOTE — Progress Notes (Signed)
Family Medicine Teaching Service Daily Progress Note Intern Pager: [phone]  Patient name: Carlos Andrews Medical record number: [id number] Date of birth: 08-11-1932 Age: 81 y.o. Gender: male  Primary Care Provider: MCDIARMID,TODD D, MD Consultants: None Code Status: DNR  Pt Overview and Major Events to Date:  3/4: Patient admitted for back pain and weakness 3/5: L2-L3 Laminectomy performed 3/6: Blood cultures positive for gram + cocci in pairs and chains 3/9: PICC placement  Assessment and Plan: Carlos Andrews is a 81 y.o. male presenting with bilateral lower extremity weakness. PMH is significant for T2DM, HTN, and recurrent falls.  Bacteremia: On admission leukocytosis 16.2>9.6 today. Blood cultures growing gram+ cocci in pairs and chains. Lactic acid 2.3. Possible source of infection discitis and psosas infection. No evidence of infection or purulence found in epidural space. Hx of spinal osteomyelitis.   Patient febrile overnight on Abx. Stable - trend CBC -urine and wound culture no growth -continue antibiotic ceftriaxone only; discontinued Vanc - possibly causing drug fever -ID consulted; appreciate recs  -Patient will need long term Abx - 6wk IV Ceftriaxone 2g daily - per ID -redrawn blood cultures no growth x48hrs   -PICC scheduled for today -2D ECHO ordered to look for endocarditis - no emboli noted; however with systolic and diastolic dysfunction; akenisis of myocardium; aortic valve calcification with regurgitation  Systolic and Diastolic CHF: Echo results as above and below. Currently asymptomatic. Likely NYHA 3. -unable to start appropriate therapy for patient due to low BPs and bradycardia -no need for cardiology consult at this time -continue to monitor  Lower extremity weakness: DDD with L2-3 disc buldge s/p laminectomy.Persistent. Patient not ambulatory at all. Uses wheelchair at home. - PT/OT consulted - recommend SNF; awaiting placement - CSW consulted - fall  precautions - regaining some LE function  Back Pain: DG lumbar and thoracic significant for degenerative spondylosis. Chronic pain. Hx of spinal stenosis of lumbar region. -neurosurgery was consulted after MRI showing moderate to severe lumbar stenosis with suspected infection -now s/p lumbar laminectomy POD #4 -Pain management: morphine q3 prn, oxycodone q4 prn, and tylenol scheduled q6 -bowel regimen senokot 2 tabs BID and miralax BID  Cough: Developed after patient swallowed medications with water. Risk of aspiritation. Lungs clear.  - SLP consulted  -dysphagia 3 diet  CKD stage 3: Unsure of baseline but last Cr 1.2 in 2014. On admission 1.54. Elevation possibly due to volume depletion vs new baseline. UA with >1000 glucose and ketones no acidosis to suggest DKA at this time. S/p fluid bolus and MIVF. Creatinine stable. - Last Cr 1.2 at baseline - encourage PO intake - avoid nephrotoxic agents  Depression: Patient stating her no longer feels like being here. Feels as though he is a burden. Thinks he will not survive this. Spirits better this morning.  -start medication Sertraline -spiritual consult placed  Hypocalcemia: corrected to 10.1 due to low albumin  HTN: Normotensive BPs. -Will hold home Lisinopril.  T2DM: Last A1c 5.4 (09/2013). Patient takes Amaryl at home. Fasting CBG of 296 on 3/5. CBGs 200-300. Elevated sugars likely due to infection and pain.  - A1c 5.5 - Holding Amaryl at this time  - continue moderate SSI with HS coverage - for better control will add long acting insulin Lantus 15U  History of left heel pressure ulcer - heel pads  Elevated troponin: no chest pain. Heart score: 3. EKG with non-specific t-wave changes that are unchanged from EKG seven days ago. Likely due to CKD 3 with AKI. -  elevated troponins - cardiology believes this to be baseline for patient - EKG with sinus rhythm  FEN/GI: Carb modified diet/ SLIV Prophylaxis: Hep Subq  Disposition:  Continue current management as above; pending placement and PICC placement.  Subjective:  Appears to be in more distress today. Continues to endorse constipation. Has pain that he states is 10/10.  Objective: Temp:  [98.3 F (36.8 C)-98.7 F (37.1 C)] 98.7 F (37.1 C) (03/09 0706) Pulse Rate:  [88-91] 88 (03/09 0706) Resp:  [16] 16 (03/09 0706) BP: (105-119)/(53-81) 119/81 mmHg (03/09 0706) SpO2:  [97 %-99 %] 99 % (03/09 0706)  Physical Exam: General: Fair appearing, appears chronically ill, mild distress Cardiovascular: Regular rate and rhythm, no murmur Respiratory: Clear to auscultation bilaterally Abdomen: Firm, non-tender, non-distended, reducible midline hernia Extremities: WWP, improved ROM Neuro: Persistent weakness BLE Alert but not oriented.   Laboratory:  Recent Labs Lab 08/15/14 0621 08/16/14 0606 08/17/14 0620  WBC 10.7* 10.6* 9.6  HGB 11.5* 10.6* 11.5*  HCT 33.9* 31.0* 34.5*  PLT 140* 164 194    Recent Labs Lab 08/12/14 1658  08/14/14 0745 08/15/14 0621 08/16/14 0606  NA 139  < > 138 135 134*  K 4.2  < > 3.9 4.2 3.9  CL 105  < > 105 105 106  CO2 24  < > 26 23 24   BUN 26*  < > 30* 24* 23  CREATININE 1.54*  < > 1.40* 1.17 1.22  CALCIUM 8.5  < > 7.9* 7.9* 8.0*  PROT 5.9*  --   --   --   --   BILITOT 2.4*  --   --   --   --   ALKPHOS 104  --   --   --   --   ALT 48  --   --   --   --   AST 22  --   --   --   --   GLUCOSE 350*  < > 259* 236* 264*  < > = values in this interval not displayed.   Imaging/Diagnostic Tests: Dg Chest 2 View 08/12/2014   IMPRESSION: Mild cardiomegaly and aortic atherosclerosis.  No acute findings.     Dg Thoracic Spine 2 View 08/12/2014  IMPRESSION: No acute findings.  Degenerative spondylosis, as described above.    Dg Lumbar Spine Complete 08/12/2014    lower lumbar spine and lumbosacral junction. No acute vertebral body height loss. Aortic and branch vessel atherosclerosis.  IMPRESSION: Advanced lumbar spondylosis,  without acute superimposed process.    Mr Thoracic Spine Wo Contrast 08/13/2014  IMPRESSION: 1. No evidence of active infection in the thoracic spine. 2. Appearance of the upper thoracic spine as above likely reflecting remote history of osteomyelitis and fusion. 3. Mild thoracic spondylosis without evidence of significant stenosis. 4. Gastric pull-through versus patulous thoracic esophagus.     Mr Lumbar Spine Wo Contrast 08/13/2014   ADDENDUM REPORT: 08/13/2014 12:42  ADDENDUM: These results were called by telephone at the time of interpretation on 08/13/2014 at 12:41 pm to Dr. Jaquita RectorMelancon, who verbally acknowledged these results.     IMPRESSION: 1. Compared with the prior MRI from 02/17/2009, there are significantly progressive changes at the L2-3 disc space level with progressive disc bulging and discal T2 hyperintensity. These findings compress the thecal sac, resulting in severe spinal and biforaminal stenosis. Although the discal hyperintensity is new, there is no progressive bone destruction to suggest osteomyelitis. However, there is apparently a history of spinal osteomyelitis, and new T2 hyperintensity  is demonstrated within the right psoas muscle such that disc space and paraspinal infection cannot be completely excluded. 2. Chronic spondylosis at L3-4, L4-5 and L5-S1. There is mildly progressive spinal stenosis at L3-4 without definite acute findings. 3. These results will be called to the ordering clinician or representative by the Radiologist Assistant, and communication documented in the PACS or zVision Dashboard.    Echo Study Conclusions  - Left ventricle: The cavity size was normal. Systolic function was moderately to severely reduced. The estimated ejection fraction was in the range of 30% to 35%. There is akinesis of the basal-midinferoseptal myocardium. There is akinesis of the basal-midinferior myocardium. Cannot exclude akinesis of the basalanteroseptal myocardium. There  was an increased relative contribution of atrial contraction to ventricular filling. Doppler parameters are consistent with abnormal left ventricular relaxation (grade 1 diastolic dysfunction). - Ventricular septum: Septal motion showed paradox. - Aortic valve: Mild thickening and calcification, consistent with sclerosis. There was mild to moderate regurgitation. - Aorta: Ascending aortic diameter: 43 mm (S). - Ascending aorta: The ascending aorta was mildly dilated. - Mitral valve: There was mild regurgitation.   Pincus Large, DO 08/17/2014, 8:07 AM PGY-1, [facility] FPTS Intern pager: [phone], text pages welcome

## 2014-08-17 NOTE — Progress Notes (Signed)
Peripherally Inserted Central Catheter/Midline Placement  The IV Nurse has discussed with the patient and/or persons authorized to consent for the patient, the purpose of this procedure and the potential benefits and risks involved with this procedure.  The benefits include less needle sticks, lab draws from the catheter and patient may be discharged home with the catheter.  Risks include, but not limited to, infection, bleeding, blood clot (thrombus formation), and puncture of an artery; nerve damage and irregular heat beat.  Alternatives to this procedure were also discussed.  PICC/Midline Placement Documentation        Carlos Andrews, Carlos Andrews 08/17/2014, 10:59 AM

## 2014-08-17 NOTE — Progress Notes (Signed)
Physical Therapy Treatment Patient Details Name: Carlos Andrews MRN: [id number] DOB: 21-Oct-1932 Today's Date: 08/17/2014    History of Present Illness 81 yo male with history of frequent falls at home, weakness of legs progressively worsening over past 2 months.  Hx lumbar senosis.  Unable to walk, pain "across back and down legs". Evidence of infection including psoas; s/p lumbar laminectomy    PT Comments    Overall pt less alert today, less responsive to questions/commands, and pain a significant factor limiting pt's ability to participate; Continuing work on sitting balance, posture, and weight shifts as building blocks for functional transfers;   Resident present during session and aware of pt's slower processing today   Follow Up Recommendations  SNF     Equipment Recommendations  Rolling walker with 5" wheels;3in1 (PT);Wheelchair (measurements PT);Wheelchair cushion (measurements PT);Other (comment) (drop-arm BSC, sliding board)    Recommendations for Other Services       Precautions / Restrictions Precautions Precautions: Fall (Back prec for comfort)  Noted on dysphagia diet/precautions   Mobility  Bed Mobility Overal bed mobility: Needs Assistance Bed Mobility: Rolling;Sidelying to Sit;Sit to Sidelying Rolling: Max assist Sidelying to sit: Max assist Supine to sit: Max assist;HOB elevated   Sit to sidelying: Max assist General bed mobility comments: cues for technique. Assist with legs and trunk to come to sitting position. Attempted to return to sidelying, but was difficult.  Transfers                 General transfer comment: Given difficulty with sitting balance, and unmistakeable grimace in pain with any movement, opted to lay back down in bed end of session  Ambulation/Gait                 Stairs            Wheelchair Mobility    Modified Rankin (Stroke Patients Only)       Balance Overall balance assessment: Needs  assistance Sitting-balance support: Bilateral upper extremity supported Sitting balance-Leahy Scale: Zero Sitting balance - Comments: tending to sit with posterior pelvic tile lending to significant posterior lean and requiring support/assist to prevent loss of balance posteriorly; Bulk of work this session was on sitting trunk extension and anterior pelvic tilt, and anterior weight shift for upright sitting and sitting balance; there is a componenet of fear of falling anteriorly; and worked on reaching forward and trunk extensio; stiffness noted; pain was a limiting factor as well; once holding to stabilize something in front of him (bars of STEDY) Postural control: Posterior lean                          Cognition Arousal/Alertness: Lethargic (less reponsive to questions/commands than yesterday) Behavior During Therapy: WFL for tasks assessed/performed;Flat affect Overall Cognitive Status: Impaired/Different from baseline Area of Impairment: Orientation;Following commands;Attention;Safety/judgement;Problem solving Orientation Level: Disoriented to;Place;Situation Current Attention Level: Sustained   Following Commands: Follows one step commands inconsistently;Follows one step commands with increased time     Problem Solving: Slow processing;Decreased initiation;Difficulty sequencing;Requires verbal cues;Requires tactile cues General Comments: Less interactive today; taking incr time to answer questions, and at times not answering; Unmistakeable grimace with LE motion, and the work on sitting balance    Exercises General Exercises - Lower Extremity Heel Slides: AAROM;Both;10 reps;Supine;Limitations Heel Slides Limitations: Less strength noted with pt a bit more lethargic than yesterday    General Comments        Pertinent Vitals/Pain Pain Assessment:  Faces Faces Pain Scale: Hurts worst Pain Location: low back and legs; Pt tends to not ask for pain meds, but will agree to  them if offered Pain Descriptors / Indicators: Grimacing;Guarding Pain Intervention(s): Premedicated before session;Repositioned;Patient requesting pain meds-RN notified    Home Living                      Prior Function            PT Goals (current goals can now be found in the care plan section) Acute Rehab PT Goals Patient Stated Goal: to ambulate Progress towards PT goals: Not progressing toward goals - comment (limited by pain, less responsive)    Frequency  Min 5X/week    PT Plan Current plan remains appropriate    Co-evaluation             End of Session Equipment Utilized During Treatment: Other (comment) (bed pad to control hips; used STEDY as well) Activity Tolerance: Patient limited by pain;Patient limited by lethargy Patient left: in bed;with call bell/phone within reach;with nursing/sitter in room     Time: 0906-0929 PT Time Calculation (min) (ACUTE ONLY): 23 min  Charges:  $Therapeutic Activity: 23-37 mins                    G Codes:      Olen PelGarrigan, Zacari Stiff Hamff 08/17/2014, 12:19 PM  Van ClinesHolly Clydene Burack, [place]PT  Acute Rehabilitation Services Pager [phone] Office [phone]

## 2014-08-17 NOTE — Progress Notes (Signed)
Inpatient Diabetes Program Recommendations  AACE/ADA: New Consensus Statement on Inpatient Glycemic Control (2013)  Target Ranges:  Prepandial:   less than 140 mg/dL      Peak postprandial:   less than 180 mg/dL (1-2 hours)      Critically ill patients:  140 - 180 mg/dL   Results for Loran SentersRKS, Jurell R (MRN [id number]) as of 08/17/2014 07:52  Ref. Range 08/16/2014 06:25 08/16/2014 13:47 08/16/2014 17:30 08/16/2014 22:59 08/17/2014 07:10  Glucose-Capillary Latest Range: 70-99 mg/dL 409262 (H) 811207 (H) 914204 (H) 256 (H) 245 (H)   Current orders for Inpatient glycemic control: Lantus 10 units QHS, Novolog 0-15 units TID with meals  Inpatient Diabetes Program Recommendations Insulin - Basal: Please consider increasing Lantus to 15 units QHS. Correction (SSI): Bedtime glucose was 256 mg/dl last night but no insulin given since no bedtime correction scale ordered. Please add Novolog bedtime correction scale.   Thanks, Orlando PennerMarie Dru Laurel, RN, MSN, CCRN, CDE Diabetes Coordinator Inpatient Diabetes Program [phone] (Team Pager) [phone] ([facility] office) [phone] [facility] office)

## 2014-08-17 NOTE — Progress Notes (Signed)
[facility]    Date of Admission:  08/12/2014   Total days of antibiotics 5        Day 5 ceftriaxone           ID: Carlos Andrews is a 81 y.o. 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 Underwent swallow eval that showed moderate dysphagia. Also had TTE that did not show vegetation but has Aortic and mitral valve regurgitation. EF 30-35%  Medications:  . acetaminophen  500 mg Oral Q6H  . aspirin EC  81 mg Oral Daily  . calcium-vitamin D  1 tablet Oral Daily  . cefTRIAXone (ROCEPHIN)  IV  2 g Intravenous Q24H  . gabapentin  300 mg Oral BID  . insulin aspart  0-15 Units Subcutaneous TID WC  . insulin aspart  0-5 Units Subcutaneous QHS  . insulin glargine  15 Units Subcutaneous QHS  . multivitamin with minerals  1 tablet Oral Daily  . polyethylene glycol  17 g Oral BID  . pyridOXINE  100 mg Oral TID  . senna-docusate  2 tablet Oral BID  . sertraline  25 mg Oral Daily  . vitamin B-12  1,000 mcg Oral Daily  . vitamin C  500 mg Oral Daily    Objective: Vital signs in last 24 hours: Temp:  [98.3 F (36.8 C)-98.7 F (37.1 C)] 98.7 F (37.1 C) (03/09 0706) Pulse Rate:  [88-91] 88 (03/09 0706) Resp:  [16] 16 (03/09 0706) BP: (105-119)/(53-81) 119/81 mmHg (03/09 0706) SpO2:  [97 %-99 %] 99 % (03/09 0706)  Did not examine, getting picc line  Lab Results  Recent Labs  08/15/14 0621 08/16/14 0606 08/17/14 0620  WBC 10.7* 10.6* 9.6  HGB 11.5* 10.6* 11.5*  HCT 33.9* 31.0* 34.5*  NA 135 134*  --   K 4.2 3.9  --   CL 105 106  --   CO2 23 24  --   BUN 24* 23  --   CREATININE 1.17 1.22  --     Liver Panel No results for input(s): PROT, ALBUMIN, AST, ALT, ALKPHOS, BILITOT, BILIDIR, IBILI in the last 72 hours. Sedimentation Rate No results for input(s): ESRSEDRATE in the last 72 hours. C-Reactive Protein No results for input(s): CRP in the last 72 hours.  Microbiology: 3/7 blood cx pending 3/5 tissue cx microaerophiilc strep 3/5 urine cx ngtd 3/5 blood cx microaerophilic strep Studies/Results: Dg Swallowing Func-speech Pathology  08/16/2014    Objective Swallowing Evaluation:    Patient Details  Name: Carlos Andrews MRN: [id number] Date of Birth: Nov 02, 1932  Today's Date: 08/16/2014 Time: SLP Start Time (ACUTE ONLY): 1547-SLP Stop Time (ACUTE ONLY): 1610 SLP Time Calculation (min) (ACUTE ONLY): 23 min  Past Medical History:  Past Medical History  Diagnosis Date  . DIABETES MELLITUS, II, COMPLICATIONS 08/07/2006  . Esophageal perforation 1993    managed at [facility]  . PPD positive 1993    Found at [facility]  . Spinal stenosis of lumbar region at multiple levels   . Proteinuria 08/07/2006    Qualifier: Diagnosis of  By: Haydee Salter    .  Open angle with borderline findings and low glaucoma risk in both eyes  02/23/2013  . Regular astigmatism 02/23/2013  . Presbyopia 02/23/2013  . Ptosis of left eyelid 02/23/2013  . At risk for falling 10/04/2013  . GAIT DISTURBANCE 08/01/2008        . HYPERLIPIDEMIA 08/07/2006    Qualifier: Diagnosis of  By: Haydee Salter    . HYPERTENSION, BENIGN SYSTEMIC 08/07/2006    Qualifier: Diagnosis of  By: Haydee Salter    . IMPOTENCE, ORGANIC 08/07/2006    Qualifier: Diagnosis of  By: Haydee Salter    . LUMBAR SPRAIN AND STRAIN 07/19/2008    Qualifier: Diagnosis of  By: Mauricio Po MD, Fayrene Fearing    . Myopia with presbyopia of left eye 02/23/2013  . OBESITY, NOS 08/07/2006    Qualifier: Diagnosis of  By: Haydee Salter    . OSTEOARTHRITIS, LOWER LEG 08/07/2006    Qualifier: Diagnosis of  By: Haydee Salter    . PERIPHERAL EDEMA 08/01/2008    Qualifier: Diagnosis of  By: Sheffield Slider MD, Deniece Portela    .  Peripheral vascular disease, unspecified 04/21/2012    Severe tibial occlusive disease (Dr Hart Rochester, Vasc Surgery, 04/2002)     . Recurrent falls 10/04/2013  . Skin ulcer of foot, left heel 02/23/2013  . Type II or unspecified type diabetes mellitus without mention of  complication, not stated as uncontrolled 08/07/2006    Qualifier: Diagnosis of  By: Haydee Salter    . Unspecified hereditary and idiopathic peripheral neuropathy 08/07/2006        . Proteinuria 08/07/2006  . Impairment of balance 05/10/2014  . Urge incontinence 05/10/2014  . Impaired mobility and ADLs 05/10/2014   Past Surgical History:  Past Surgical History  Procedure Laterality Date  . Neck surgery  1993    s/p C1-2, C2-3 anterior cervical diskectomies, partial corpectomies  through thoracotomy at [facility] 1993  . Cataract extraction      Cataract surgery   . Thoracotomy  1993    s/p T3-T4 osteomyelitis with multiple open drainages including  thoracotomy at [facility] 1993  . Spinal cord decompression  1993    s/p T1-T2 spinal cord compression secondary to granualtion tissue and  abscess material secondary to previous mediastinitis at [facility] 1993  . Jejunostomy feeding tube  1993    s/p Feeding jejunostomy [facility] 1993  . Esophageal perforation  1993    Hx of esophageal perforation complication 1993  . Lumbar epidural injection  2010    Epidural corticosteroid injection for L2-3 & L4-5 severe spinal stenosis  at [facility] on 9/10, 10/10, 11/10    . Lumbar laminectomy/decompression microdiscectomy N/A 08/13/2014    Procedure: LUMBAR LAMINECTOMY ;  Surgeon: Shirlean Kelly, MD;   Location: [facility]UR[facility] ORS;  Service: Neurosurgery;  Laterality: N/A;   HPI:  HPI: 81 yo male with history of frequent falls at home, weakness of legs  progressively worsening over past 2 months. Hx lumbar senosis. Unable to  walk, pain "across back and down legs". Evidence of infection including  psoas; s/p lumbar laminectomy. Pt reports coughing with PO intake.  No Data Recorded   Assessment / Plan / Recommendation CHL IP CLINICAL IMPRESSIONS 08/16/2014  Dysphagia Diagnosis Moderate pharyngeal phase dysphagia;Moderate cervical  esophageal phase dysphagia    Clinical impression Pt has a moderate pharyngeal and cervical esophageal  dysphagia due to reduced hyolaryngeal elevation/excursion leading to  decreased UES relaxation. Of note, view of UES was limited throughout the  course of the study due to obstruction  from pt's shoulders. Pt adequately  protected his airway during his initial swallow, however residual material  from liquids spilled into the laryngeal vestibule upon completion of the  swallow as hyolaryngeal complex returned to resting position. Thin liquids  were aspirated, while nectar and honey thick liquids were both able to be  mostly cleared with SLP cued coughing, as they were resulted in more  shallow penetration.  It is difficult to assess sensation with airway  protection, as once SLP provided prompting for coughing with swallow x1,  pt began to forcefully cough with every swallow regardless of whether  there was any penetration occuring. Recommend Dys 3 diet and nectar thick  liquids with small, single cup sips and hard cough after each sip. SLP to  follow for tolerance and utilization of strategies.      CHL IP TREATMENT RECOMMENDATION 08/16/2014  Treatment Plan Recommendations Therapy as outlined in treatment plan below      CHL IP DIET RECOMMENDATION 08/16/2014  Diet Recommendations Dysphagia 3 (Mechanical Soft);Nectar-thick liquid  Liquid Administration via Cup;No straw  Medication Administration Whole meds with puree  Compensations Slow rate;Small sips/bites;Hard cough after swallow;Multiple  dry swallows after each bite/sip  Postural Changes and/or Swallow Maneuvers Seated upright 90  degrees;Upright 30-60 min after meal     CHL IP OTHER RECOMMENDATIONS 08/16/2014  Recommended Consults (None)  Oral Care Recommendations Oral care BID  Other Recommendations Order thickener from  pharmacy;Prohibited food  (jello, ice cream, thin soups);Remove water pitcher     CHL IP FOLLOW UP RECOMMENDATIONS 08/16/2014  Follow up Recommendations Inpatient Rehab     CHL IP FREQUENCY AND DURATION 08/16/2014  Speech Therapy Frequency (ACUTE ONLY) min 2x/week  Treatment Duration 2 weeks     Pertinent Vitals/Pain: pain with repositioning, monitored     SLP Swallow Goals     CHL IP REASON FOR REFERRAL 08/16/2014  Reason for Referral Objectively evaluate swallowing function     CHL IP ORAL PHASE 08/16/2014  Lips (None)  Tongue (None)  Mucous membranes (None)  Nutritional status (None)  Other (None)  Oxygen therapy (None)  Oral Phase WFL  Oral - Pudding Teaspoon (None)  Oral - Pudding Cup (None)  Oral - Honey Teaspoon (None)  Oral - Honey Cup (None)  Oral - Honey Syringe (None)  Oral - Nectar Teaspoon (None)  Oral - Nectar Cup (None)  Oral - Nectar Straw (None)  Oral - Nectar Syringe (None)  Oral - Ice Chips (None)  Oral - Thin Teaspoon (None)  Oral - Thin Cup (None)  Oral - Thin Straw (None)  Oral - Thin Syringe (None)  Oral - Puree (None)  Oral - Mechanical Soft (None)  Oral - Regular (None)  Oral - Multi-consistency (None)  Oral - Pill (None)  Oral Phase - Comment (None)      CHL IP PHARYNGEAL PHASE 08/16/2014  Pharyngeal Phase Impaired  Pharyngeal - Pudding Teaspoon (None)  Penetration/Aspiration details (pudding teaspoon) (None)  Pharyngeal - Pudding Cup (None)  Penetration/Aspiration details (pudding cup) (None)  Pharyngeal - Honey Teaspoon (None)  Penetration/Aspiration details (honey teaspoon) (None)  Pharyngeal - Honey Cup Reduced anterior laryngeal mobility;Reduced  laryngeal elevation;Reduced pharyngeal peristalsis;Penetration/Aspiration  after swallow;Pharyngeal residue - pyriform sinuses;Pharyngeal residue -  cp segment  Penetration/Aspiration details (honey cup) Material enters airway, remains  ABOVE vocal cords and not ejected out  Pharyngeal - Honey Syringe (None)  Penetration/Aspiration details (honey  syringe) (None)  Pharyngeal - Nectar Teaspoon (None)  Penetration/Aspiration details (nectar teaspoon) (None)  Pharyngeal -  Nectar Cup Reduced anterior laryngeal mobility;Reduced  laryngeal elevation;Reduced pharyngeal peristalsis;Penetration/Aspiration  after swallow;Pharyngeal residue - pyriform sinuses;Pharyngeal residue -  cp segment  Penetration/Aspiration details (nectar cup) Material enters airway,  remains ABOVE vocal cords and not ejected out  Pharyngeal - Nectar Straw (None)  Penetration/Aspiration details (nectar straw) (None)  Pharyngeal - Nectar Syringe (None)  Penetration/Aspiration details (nectar syringe) (None)  Pharyngeal - Ice Chips (None)  Penetration/Aspiration details (ice chips) (None)  Pharyngeal - Thin Teaspoon (None)  Penetration/Aspiration details (thin teaspoon) (None)  Pharyngeal - Thin Cup Reduced anterior laryngeal mobility;Reduced  laryngeal elevation;Reduced pharyngeal peristalsis;Penetration/Aspiration  after swallow;Pharyngeal residue - pyriform sinuses;Pharyngeal residue -  cp segment  Penetration/Aspiration details (thin cup) Material enters airway, passes  BELOW cords without attempt by patient to eject out (silent aspiration)  Pharyngeal - Thin Straw (None)  Penetration/Aspiration details (thin straw) (None)  Pharyngeal - Thin Syringe (None)  Penetration/Aspiration details (thin syringe') (None)  Pharyngeal - Puree Reduced anterior laryngeal mobility;Reduced laryngeal  elevation;Reduced pharyngeal peristalsis;Pharyngeal residue - pyriform  sinuses;Pharyngeal residue - cp segment  Penetration/Aspiration details (puree) Material does not enter airway  Pharyngeal - Mechanical Soft Reduced anterior laryngeal mobility;Reduced  laryngeal elevation;Reduced pharyngeal peristalsis;Pharyngeal residue -  pyriform sinuses;Pharyngeal residue - cp segment  Penetration/Aspiration details (mechanical soft) Material does not enter  airway  Pharyngeal - Regular (None)  Penetration/Aspiration  details (regular) (None)  Pharyngeal - Multi-consistency (None)  Penetration/Aspiration details (multi-consistency) (None)  Pharyngeal - Pill (None)  Penetration/Aspiration details (pill) (None)  Pharyngeal Comment (None)     CHL IP CERVICAL ESOPHAGEAL PHASE 08/16/2014  Cervical Esophageal Phase Impaired  Pudding Teaspoon (None)  Pudding Cup (None)  Honey Teaspoon (None)  Honey Cup Reduced cricopharyngeal relaxation  Honey Syringe (None)  Nectar Teaspoon (None)  Nectar Cup Reduced cricopharyngeal relaxation  Nectar Straw (None)  Nectar Syringe (None)  Thin Teaspoon (None)  Thin Cup Reduced cricopharyngeal relaxation  Thin Straw (None)  Thin Syringe (None)  Cervical Esophageal Comment (None)        Maxcine HamLaura Paiewonsky, M.A. CCC-SLP [phone]  Maxcine Hamaiewonsky, Laura 08/16/2014, 4:37 PM      Assessment/Plan: Fevers = appears now afebrile x 24hr. Repeat cx ngtd, but not finalized  Discitis, psoas m abscess, bacteremia due to Microaerophilic strep = continue with high dose ceftriaxone 2gm iv daily x 6 wks using 3/7 as day 1 of 42. He is getting picc line today. He will need weekly cbc and bmp.  microaeorophilic strep bacteremia = difficult to tell if he has concurrent endocarditis. i think it would be high risk to get TEE given his underlying dysphagia. Treatment course of discitis would also cover endocarditis  We will see him in the ID clinic in 4-6 wk.  Drue SecondSNIDER, [facility] Hospital[facility]NTHIA [facility] Cell: [phone] Pager: [phone]  08/17/2014, 11:00 AM

## 2014-08-17 NOTE — Progress Notes (Signed)
SLP Cancellation Note  Patient Details Name: Carlos Andrews MRN: [id number] DOB: 01/27/1933   Cancelled treatment:       Reason Eval/Treat Not Completed: Patient at procedure or test/unavailable (having sterile procedure completed in room). Will return as schedule allows.   Carlos Andrews, Carlos Andrews [phone]  Carlos Andrews, Carlos Andrews 08/17/2014, 10:47 AM

## 2014-08-18 ENCOUNTER — Inpatient Hospital Stay (HOSPITAL_COMMUNITY): Payer: Medicare Other

## 2014-08-18 DIAGNOSIS — R410 Disorientation, unspecified: Secondary | ICD-10-CM | POA: Insufficient documentation

## 2014-08-18 LAB — GLUCOSE, CAPILLARY
Glucose-Capillary: 207 mg/dL — ABNORMAL HIGH (ref 70–99)
Glucose-Capillary: 183 mg/dL — ABNORMAL HIGH (ref 70–99)
Glucose-Capillary: 170 mg/dL — ABNORMAL HIGH (ref 70–99)
Glucose-Capillary: 165 mg/dL — ABNORMAL HIGH (ref 70–99)

## 2014-08-18 LAB — ANAEROBIC CULTURE: Gram Stain: NONE SEEN

## 2014-08-18 MED ORDER — INSULIN GLARGINE 100 UNIT/ML ~~LOC~~ SOLN: 20.0000 [IU] | Freq: Every day | SUBCUTANEOUS | Status: AC

## 2014-08-18 MED ORDER — CEFTRIAXONE SODIUM IN DEXTROSE 40 MG/ML IV SOLN: 2.0000 g | INTRAVENOUS | Status: DC

## 2014-08-18 MED ORDER — INSULIN ASPART 100 UNIT/ML ~~LOC~~ SOLN: 3.0000 [IU] | Freq: Three times a day (TID) | SUBCUTANEOUS | Status: AC

## 2014-08-18 MED ORDER — STARCH (THICKENING) PO POWD: 1.0000 | ORAL | Status: AC | PRN

## 2014-08-18 MED ORDER — INSULIN GLARGINE 100 UNIT/ML ~~LOC~~ SOLN
20.0000 [IU] | Freq: Every day | SUBCUTANEOUS | Status: DC
  Administered 2014-08-19: 20 [IU] via SUBCUTANEOUS
  Filled 2014-08-18 (×3): qty 0.2

## 2014-08-18 MED ORDER — SENNOSIDES-DOCUSATE SODIUM 8.6-50 MG PO TABS: 2.0000 | ORAL_TABLET | Freq: Two times a day (BID) | ORAL | Status: AC

## 2014-08-18 MED ORDER — OXYCODONE HCL 10 MG PO TABS: 10.0000 mg | ORAL_TABLET | ORAL | Status: DC | PRN

## 2014-08-18 MED ORDER — GABAPENTIN 300 MG PO CAPS: 300.0000 mg | ORAL_CAPSULE | Freq: Two times a day (BID) | ORAL | Status: AC

## 2014-08-18 NOTE — Clinical Social Work Note (Addendum)
CSW met with patient's son Damarri Rampy to review bed offers.  Patient MD/PCP rounds at [facility] and requests patient to be discharged to [facility].  CSW presented bed offers to son and son has chosen [facility] at time of discharge.  Patient will need [facility] transportation.  Disposition: [facility] via Cyndi Lennert, [place] [phone]  Psychiatric & Orthopedics (5N 1-16) Clinical Social Worker

## 2014-08-18 NOTE — Progress Notes (Signed)
Speech Language Pathology Treatment: Dysphagia  Patient Details Name: Carlos SentersHerman R Andrews MRN: [id number] DOB: 10/19/1932 Today's Date: 08/18/2014 Time: [id number] SLP Time Calculation (min) (ACUTE ONLY): 8 min  Assessment / Plan / Recommendation Clinical Impression  Pt better able to participate in PO trials today due to increased alertness and ability to follow one-step commands. He recalled need for effortful cough post-swallow and utilized it with sips of nectar thick liquids with Min cues. Pt had delayed throat clearing concerning for sensed penetration, however on MBS pt was able to expectorate penetrates. He remains afebrile and CXR from previous date is stable. Recommend to continue current diet and precautions.   HPI HPI: 81 yo male with history of frequent falls at home, weakness of legs progressively worsening over past 2 months. Hx lumbar senosis. Unable to walk, pain "across back and down legs". Evidence of infection including psoas; s/p lumbar laminectomy. Pt reports coughing with PO intake.   Pertinent Vitals Pain Assessment: Faces Faces Pain Scale: Hurts little more Pain Location: pt reports pain in back, does not rate Pain Descriptors / Indicators: Grimacing Pain Intervention(s): Limited activity within patient's tolerance;Monitored during session  SLP Plan  Continue with current plan of care    Recommendations Diet recommendations: Dysphagia 3 (mechanical soft);Nectar-thick liquid Liquids provided via: Cup;No straw Medication Administration: Whole meds with puree Supervision: Patient able to self feed;Full supervision/cueing for compensatory strategies Compensations: Slow rate;Small sips/bites;Hard cough after swallow;Multiple dry swallows after each bite/sip Postural Changes and/or Swallow Maneuvers: Seated upright 90 degrees;Upright 30-60 min after meal       Oral Care Recommendations: Oral care BID Follow up Recommendations: Skilled Nursing facility Plan: Continue with  current plan of care    Maxcine HamLaura Paiewonsky, M.A. CCC-SLP [phone]  Maxcine Hamaiewonsky, [name] 08/18/2014, 12:15 PM

## 2014-08-18 NOTE — Discharge Instructions (Signed)
To SNF for rehabilitation.

## 2014-08-18 NOTE — Progress Notes (Signed)
Subjective: Patient sitting up in chair, having been mobilized by physical therapy.  Comfortable. Continues on Rocephin 2 g daily. Continues therapies.  Objective: Vital signs in last 24 hours: Filed Vitals:   08/17/14 0706 08/17/14 1600 08/17/14 1948 08/18/14 0548  BP: 119/81 130/76 120/66 140/74  Pulse: 88 70 91 85  Temp: 98.7 F (37.1 C) 98.1 F (36.7 C) 98.8 F (37.1 C) 98.6 F (37 C)  TempSrc: Oral Oral Oral Oral  Resp: 16 18    SpO2: 99%  100% 100%    Intake/Output from previous day: 03/09 0701 - 03/10 0700 In: 400 [P.O.:400] Out: 550 [Urine:550] Intake/Output this shift:    Physical Exam:  Awake and alert, following commands.  Paraparesis without significant improvement or worsening.   CBC  Recent Labs  08/16/14 0606 08/17/14 0620  WBC 10.6* 9.6  HGB 10.6* 11.5*  HCT 31.0* 34.5*  PLT 164 194   BMET  Recent Labs  08/16/14 0606  NA 134*  K 3.9  CL 106  CO2 24  GLUCOSE 264*  BUN 23  CREATININE 1.22  CALCIUM 8.0*    Assessment/Plan: Stable from a neurosurgical perspective. I will be out of town until March 15, please contact my on call partner if neurosurgical issues or significant questions arise.   Hewitt ShortsNUDELMAN,ROBERT W, MD 08/18/2014, 10:38 AM

## 2014-08-18 NOTE — Progress Notes (Signed)
Physical Therapy Treatment Patient Details Name: Carlos Andrews MRN: [id number] DOB: 06-30-1932 Today's Date: 08/18/2014    History of Present Illness 81 yo male with history of frequent falls at home, weakness of legs progressively worsening over past 2 months.  Hx lumbar senosis.  Unable to walk, pain "across back and down legs". Evidence of infection including psoas; s/p lumbar laminectomy    PT Comments    Pain more tolerable this session. Pt able to perform squat pivot to chair with 2 person (A). Worked on postural exercises at EOB. Will cont to follow per POC.   Follow Up Recommendations  SNF     Equipment Recommendations  Rolling walker with 5" wheels;3in1 (PT);Wheelchair (measurements PT);Wheelchair cushion (measurements PT);Other (comment)    Recommendations for Other Services       Precautions / Restrictions Precautions Precautions: Fall Precaution Booklet Issued: No Precaution Comments: reviewed back precautions  Restrictions Weight Bearing Restrictions: No    Mobility  Bed Mobility Overal bed mobility: Needs Assistance Bed Mobility: Supine to Sit     Supine to sit: +2 for physical assistance;Mod assist     General bed mobility comments: pt able to minimally mobilize LEs; pt reaching for handrails; use of helicopter technique to pivot pt around to sitting; pt very rigid   Transfers Overall transfer level: Needs assistance Equipment used: 2 person hand held assist Transfers: Squat Pivot Transfers     Squat pivot transfers: Total assist;+2 physical assistance     General transfer comment: use of 2 person (A); bil blocking of LEs; use of draw pad and gt belt; pt was able to weightbear through LEs; remained in fwd flex position and transferred to chair with total (A)   Ambulation/Gait             General Gait Details: non ambulatoryat this time    Stairs            Wheelchair Mobility    Modified Rankin (Stroke Patients Only)        Balance Overall balance assessment: Needs assistance Sitting-balance support: Feet supported;Bilateral upper extremity supported Sitting balance-Leahy Scale: Poor Sitting balance - Comments: pt able to progress to supervision/min guard at EOB; pt requiring UE support at all times; chair placed in front of pt allowing pt to reach anteriorly and use armrests for trunk support; tolerated sitting EOB ~10 min to work on postural exercises    Standing balance support: During functional activity;Bilateral upper extremity supported Standing balance-Leahy Scale: Zero Standing balance comment: 2 person total (A) for squat pivot                     Cognition Arousal/Alertness: Awake/alert Behavior During Therapy: Flat affect Overall Cognitive Status: Impaired/Different from baseline Area of Impairment: Orientation;Following commands;Attention;Safety/judgement;Problem solving;Memory Orientation Level: Disoriented to;Place Current Attention Level: Sustained Memory: Decreased recall of precautions;Decreased short-term memory Following Commands: Follows one step commands inconsistently;Follows one step commands with increased time Safety/Judgement: Decreased awareness of safety;Decreased awareness of deficits   Problem Solving: Slow processing;Decreased initiation;Difficulty sequencing;Requires verbal cues;Requires tactile cues General Comments: pt more alert today and oriented to self and situation     Exercises Other Exercises Other Exercises: worked on cervical rotations and sidebends; pt with Rt cervical SB and rotation preference     General Comments        Pertinent Vitals/Pain Pain Assessment: Faces Faces Pain Scale: Hurts even more Pain Location: low back with movement  Pain Descriptors / Indicators: Grimacing Pain Intervention(s): Premedicated before  session;Monitored during session;Repositioned    Home Living                      Prior Function            PT  Goals (current goals can now be found in the care plan section) Acute Rehab PT Goals Patient Stated Goal: to not have pain PT Goal Formulation: With patient Time For Goal Achievement: 08/27/14 Potential to Achieve Goals: Good Progress towards PT goals: Progressing toward goals    Frequency  Min 3X/week    PT Plan Current plan remains appropriate;Frequency needs to be updated    Co-evaluation             End of Session Equipment Utilized During Treatment: Gait belt Activity Tolerance: Patient limited by fatigue;Patient limited by pain Patient left: in chair;with call bell/phone within reach;with family/visitor present     Time: 1610-96040845-0901 PT Time Calculation (min) (ACUTE ONLY): 16 min  Charges:  $Therapeutic Activity: 8-22 mins                    G CodesDonell Sievert:      Princess Karnes N, [place] CarolinaPT  [phone] 08/18/2014, 9:44 AM

## 2014-08-18 NOTE — Evaluation (Signed)
Speech Language Pathology Evaluation Patient Details Name: Carlos Andrews MRN: [id number] DOB: February 09, 1933 Today's Date: 08/18/2014 Time: 1140-1150 SLP Time Calculation (min) (ACUTE ONLY): 10 min  Problem List:  Patient Active Problem List   Diagnosis Date Noted  . Psoas abscess   . Depression 08/15/2014  . Bacteremia 08/15/2014  . S/P laminectomy   . Leg weakness   . Lumbar radiculopathy, acute   . Type 2 diabetes mellitus with diabetic chronic kidney disease   . Essential hypertension   . Leukocytosis   . Frequent falls   . Weakness of both legs 08/12/2014  . Spinal stenosis of lumbar region 05/10/2014  . Impairment of balance 05/10/2014  . Thenar atrophy, bilateral, right greater than left.  05/10/2014  . Urge incontinence 05/10/2014  . Impaired mobility and ADLs 05/10/2014  . At risk for falling 10/04/2013  . Recurrent falls 10/04/2013  . Skin ulcer of foot, left heel 02/23/2013  . Myopia with presbyopia of left eye 02/23/2013  . Open angle with borderline findings and low glaucoma risk in both eyes 02/23/2013  . Regular astigmatism 02/23/2013  . Presbyopia 02/23/2013  . Ptosis of right eyelid 02/23/2013  . Peripheral vascular disease, unspecified 04/21/2012  . Atherosclerosis of native arteries of extremity with intermittent claudication 04/21/2012  . GAIT DISTURBANCE 08/01/2008  . LUMBAR SPRAIN AND STRAIN 07/19/2008  . KIDNEY DISEASE, CHRONIC, STAGE III 03/13/2007  . Diabetes mellitus due to underlying condition with diabetic polyneuropathy 08/07/2006  . HYPERLIPIDEMIA 08/07/2006  . OBESITY, NOS 08/07/2006  . Unspecified hereditary and idiopathic peripheral neuropathy 08/07/2006  . HYPERTENSION, BENIGN SYSTEMIC 08/07/2006  . 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 OSTEOARTHRITIS, LOWER LEG 08/07/2006  . Proteinuria 08/07/2006   Past Medical History:  Past Medical History  Diagnosis Date  . DIABETES MELLITUS, II, COMPLICATIONS 08/07/2006  . Esophageal perforation 1993     managed at [facility]  . PPD positive 1993    Found at [facility]  . Spinal stenosis of lumbar region at multiple levels   . Proteinuria 08/07/2006    Qualifier: Diagnosis of  By: Haydee Salter    . Open angle with borderline findings and low glaucoma risk in both eyes 02/23/2013  . Regular astigmatism 02/23/2013  . Presbyopia 02/23/2013  . Ptosis of left eyelid 02/23/2013  . At risk for falling 10/04/2013  . GAIT DISTURBANCE 08/01/2008        . HYPERLIPIDEMIA 08/07/2006    Qualifier: Diagnosis of  By: Haydee Salter    . HYPERTENSION, BENIGN SYSTEMIC 08/07/2006    Qualifier: Diagnosis of  By: Haydee Salter    . IMPOTENCE, ORGANIC 08/07/2006    Qualifier: Diagnosis of  By: Haydee Salter    . LUMBAR SPRAIN AND STRAIN 07/19/2008    Qualifier: Diagnosis of  By: Mauricio Po MD, Fayrene Fearing    . Myopia with presbyopia of left eye 02/23/2013  . OBESITY, NOS 08/07/2006    Qualifier: Diagnosis of  By: Haydee Salter    . OSTEOARTHRITIS, LOWER LEG 08/07/2006    Qualifier: Diagnosis of  By: Haydee Salter    . PERIPHERAL EDEMA 08/01/2008    Qualifier: Diagnosis of  By: Sheffield Slider MD, Deniece Portela    . Peripheral vascular disease, unspecified 04/21/2012    Severe tibial occlusive disease (Dr Hart Rochester, Vasc Surgery, 04/2002)     . Recurrent falls 10/04/2013  . Skin ulcer of foot, left heel 02/23/2013  . Type II or unspecified type diabetes mellitus without mention of complication, not stated as uncontrolled 08/07/2006    Qualifier: Diagnosis  of  By: Haydee Salter    . Unspecified hereditary and idiopathic peripheral neuropathy 08/07/2006        . Proteinuria 08/07/2006  . Impairment of balance 05/10/2014  . Urge incontinence 05/10/2014  . Impaired mobility and ADLs 05/10/2014   Past Surgical History:  Past Surgical History  Procedure Laterality Date  . Neck surgery  1993    s/p C1-2, C2-3 anterior cervical diskectomies, partial corpectomies through thoracotomy at [facility] 1993  . Cataract extraction      Cataract surgery   .  Thoracotomy  1993    s/p T3-T4 osteomyelitis with multiple open drainages including thoracotomy at [facility] 1993  . Spinal cord decompression  1993    s/p T1-T2 spinal cord compression secondary to granualtion tissue and abscess material secondary to previous mediastinitis at [facility] 1993  . Jejunostomy feeding tube  1993    s/p Feeding jejunostomy [facility] 1993  . Esophageal perforation  1993    Hx of esophageal perforation complication 1993  . Lumbar epidural injection  2010    Epidural corticosteroid injection for L2-3 & L4-5 severe spinal stenosis at [facility] on 9/10, 10/10, 11/10    . Lumbar laminectomy/decompression microdiscectomy N/A 08/13/2014    Procedure: LUMBAR LAMINECTOMY ;  Surgeon: Shirlean Kelly, MD;  Location: [facility];  Service: Neurosurgery;  Laterality: N/A;   HPI:  81 yo male with history of frequent falls at home, weakness of legs progressively worsening over past 2 months. Hx lumbar senosis. Unable to walk, pain "across back and down legs". Evidence of infection including psoas; s/p lumbar laminectomy. Pt reports coughing with PO intake.   Assessment / Plan / Recommendation Clinical Impression  Pt has decreased sustained attention and resultant storage of new information, limiting pt's ability to complete of two-step instructions and participate in mildly complex tasks. He has limited awareness of deficits, often trying to decline SLP attempts at cueing. Pt appears to be regularly requesting pain medications, and question if this may be an underlying source of difficulties. Will continue to follow to assess cognitive abilities and faciltiate cognition for participation in functional tasks during recovery.    SLP Assessment  Patient needs continued Speech Lanaguage Pathology Services    Follow Up Recommendations  Skilled Nursing facility;24 hour supervision/assistance    Frequency and Duration min 2x/week  2 weeks   Pertinent Vitals/Pain Pain Assessment:  Faces Faces Pain Scale: Hurts little more Pain Location: pt reports pain in back, does not rate Pain Descriptors / Indicators: Grimacing Pain Intervention(s): Limited activity within patient's tolerance;Monitored during session   SLP Goals  Progression toward goals: Progressing toward goals Patient/Family Stated Goal: none stated Potential to Achieve Goals (ACUTE ONLY): Good Potential Considerations (ACUTE ONLY): Pain level  SLP Evaluation Prior Functioning  Cognitive/Linguistic Baseline: Information not available   Cognition  Overall Cognitive Status: Impaired/Different from baseline Arousal/Alertness: Suspect due to medications Orientation Level: Oriented to person;Oriented to situation;Disoriented to place;Disoriented to time Attention: Sustained Sustained Attention: Impaired Sustained Attention Impairment: Verbal basic Memory: Impaired Memory Impairment: Storage deficit;Retrieval deficit;Decreased recall of new information Awareness: Impaired Awareness Impairment: Intellectual impairment;Emergent impairment;Anticipatory impairment Safety/Judgment: Impaired    Comprehension  Auditory Comprehension Overall Auditory Comprehension: Impaired Yes/No Questions: Impaired Complex Questions: 25-49% accurate Commands: Impaired One Step Basic Commands: 75-100% accurate Two Step Basic Commands: 50-74% accurate Conversation: Simple Interfering Components: Attention;Other (comment) (? pain meds) EffectiveTechniques: Repetition Reading Comprehension Reading Status: Not tested    Expression Expression Primary Mode of Expression: Verbal Verbal Expression Overall Verbal Expression:  Appears within functional limits for tasks assessed Written Expression Written Expression: Not tested   Oral / Motor Oral Motor/Sensory Function Overall Oral Motor/Sensory Function: Appears within functional limits for tasks assessed Motor Speech Overall Motor Speech: Appears within functional limits  for tasks assessed   GO     Maxcine HamLaura Paiewonsky, M.A. CCC-SLP [phone]  Maxcine Hamaiewonsky, Journii Nierman 08/18/2014, 12:32 PM

## 2014-08-18 NOTE — Progress Notes (Signed)
Family Medicine Teaching Service Daily Progress Note Intern Pager: [phone]  Patient name: [name] Medical record number: [id number] Date of birth: 28-Oct-1932 Age: 81 y.o. Gender: male  Primary Care Provider: MCDIARMID,TODD D, MD Consultants: None Code Status: DNR  Pt Overview and Major Events to Date:  3/4: Patient admitted for back pain and weakness 3/5: L2-L3 Laminectomy performed 3/6: Blood cultures positive for gram + cocci in pairs and chains 3/9: PICC placement  Assessment and Plan: Carlos Andrews is a 81 y.o. male presenting with bilateral lower extremity weakness. PMH is significant for T2DM, HTN, and recurrent falls.  Bacteremia: On admission leukocytosis 16.2. Blood cultures growing gram+ cocci in pairs and chains. Lactic acid 2.3. Possible source of infection discitis and psosas infection. No evidence of infection or purulence found in epidural space. Hx of spinal osteomyelitis. Patient febrile overnight on Abx. Stable - trend CBC -urine and wound culture no growth -continue antibiotic ceftriaxone only; discontinued Vanc - possibly causing drug fever -ID consulted; appreciate recs - needs follow-up in 4-6 weeks -Patient will need long term Abx - 6wk IV Ceftriaxone 2g daily - per ID -redrawn blood cultures no growth  -PICC in place; will continue with PICC at discharge -2D ECHO ordered to look for endocarditis - no emboli noted; however with systolic and diastolic dysfunction; akenisis of myocardium; aortic valve calcification with regurgitation  Delirium: patient appears to have had some delirium yesterday and was more confused than his usual. Suspecting baseline dementia. CT head was ordered due to the acute change in mental status. Improved today. -CT with no acute findings; shows atrophy and chronic microvascular disease -continue to monitor and reorient patient  Systolic and Diastolic CHF: Echo results as above and below. Currently asymptomatic. Likely NYHA 3.  Euvolemic. -unable to start appropriate therapy for patient due to low BPs and bradycardia -no need for cardiology consult at this time -continue to monitor  Lower extremity weakness: DDD with L2-3 disc buldge s/p laminectomy.Persistent. Patient not ambulatory at all. Uses wheelchair at home. - PT/OT consulted - recommend SNF; will be going to [facility] - CSW consulted and following for dispo - fall precautions - regaining some LE function  Back Pain: DG lumbar and thoracic significant for degenerative spondylosis. Chronic pain. Hx of spinal stenosis of lumbar region. -neurosurgery was consulted after MRI showing moderate to severe lumbar stenosis with suspected infection -now s/p lumbar laminectomy POD #4 -Pain management: morphine q3 prn, oxycodone q4 prn, and tylenol scheduled q6 -bowel regimen senokot 2 tabs BID and miralax BID; s/p enema  CKD stage 3: Unsure of baseline but last Cr 1.2 in 2014. On admission 1.54. Elevation possibly due to volume depletion vs new baseline. UA with >1000 glucose and ketones no acidosis to suggest DKA at this time. S/p fluid bolus and MIVF. Creatinine stable. - Last Cr 1.2 at baseline - encourage PO intake - avoid nephrotoxic agents  Depression: Patient stating her no longer feels like being here. Feels as though he is a burden. Thinks he will not survive this. Spirits better this morning.  Mood improved. -continue Sertraline -spiritual consult placed  HTN: Normotensive BPs. -Will hold home Lisinopril.  T2DM: Last A1c 5.4 (09/2013). Patient takes Amaryl at home. Fasting CBG of 296 on 3/5. CBGs 200-300. Elevated sugars likely due to infection and pain.  - A1c 5.5 - Holding Amaryl at this time  - continue moderate SSI with HS coverage - continue insulin Lantus 20U  History of left heel pressure ulcer - heel  pads  FEN/GI: dysphagia 3 diet/ SLIV Prophylaxis: Hep Subq  Disposition: Continue current management as above; SNF today.  Subjective:   Patient doing well this morning. He is still not completely oriented to date but much improved from yesterday. Patient looking more comfortable than prior and states his pain is controlled currently. When asked if he had a bowel movement patient says that he has not. On chart there is no recorded BM.  Objective: Temp:  [98.1 F (36.7 C)-98.8 F (37.1 C)] 98.6 F (37 C) (03/10 0548) Pulse Rate:  [70-91] 85 (03/10 0548) Resp:  [18] 18 (03/09 1600) BP: (120-140)/(66-76) 140/74 mmHg (03/10 0548) SpO2:  [100 %] 100 % (03/10 0548)  Physical Exam: General: Fair appearing, appears chronically ill, NAD Cardiovascular: Regular rate and rhythm, no murmur Respiratory: Clear to auscultation bilaterally Abdomen: Firm, non-tender, non-distended, reducible midline hernia Extremities: WWP, improved ROM Neuro: Persistent weakness BLE Alert and oriented x2..   Laboratory:  Recent Labs Lab 08/15/14 [phone] 08/16/14 0606 08/17/14 0620  WBC 10.7* 10.6* 9.6  HGB 11.5* 10.6* 11.5*  HCT 33.9* 31.0* 34.5*  PLT 140* 164 194    Recent Labs Lab 08/12/14 1658  08/14/14 0745 08/15/14 0621 08/16/14 0606  NA 139  < > 138 135 134*  K 4.2  < > 3.9 4.2 3.9  CL 105  < > 105 105 106  CO2 24  < > BUN 26*  < > 30* 24* 23  CREATININE 1.54*  < > 1.40* 1.17 1.22  CALCIUM 8.5  < > 7.9* 7.9* 8.0*  PROT 5.9*  --   --   --   --   BILITOT 2.4*  --   --   --   --   ALKPHOS 104  --   --   --   --   ALT 48  --   --   --   --   AST 22  --   --   --   --   GLUCOSE 350*  < > 259* 236* 264*  < > = values in this interval not displayed.   Imaging/Diagnostic Tests: Dg Chest 2 View 08/12/2014   IMPRESSION: Mild cardiomegaly and aortic atherosclerosis.  No acute findings.     Dg Thoracic Spine 2 View 08/12/2014  IMPRESSION: No acute findings.  Degenerative spondylosis, as described above.    Dg Lumbar Spine Complete 08/12/2014    lower lumbar spine and lumbosacral junction. No acute vertebral body height  loss. Aortic and branch vessel atherosclerosis.  IMPRESSION: Advanced lumbar spondylosis, without acute superimposed process.    Mr Thoracic Spine Wo Contrast 08/13/2014  IMPRESSION: 1. No evidence of active infection in the thoracic spine. 2. Appearance of the upper thoracic spine as above likely reflecting remote history of osteomyelitis and fusion. 3. Mild thoracic spondylosis without evidence of significant stenosis. 4. Gastric pull-through versus patulous thoracic esophagus.     Mr Lumbar Spine Wo Contrast 08/13/2014   ADDENDUM REPORT: 08/13/2014 12:42  ADDENDUM: These results were called by telephone at the time of interpretation on 08/13/2014 at 12:41 pm to Dr. Jaquita Rector, who verbally acknowledged these results.     IMPRESSION: 1. Compared with the prior MRI from 02/17/2009, there are significantly progressive changes at the L2-3 disc space level with progressive disc bulging and discal T2 hyperintensity. These findings compress the thecal sac, resulting in severe spinal and biforaminal stenosis. Although the discal hyperintensity is new, there is no progressive bone destruction to suggest  osteomyelitis. However, there is apparently a history of spinal osteomyelitis, and new T2 hyperintensity is demonstrated within the right psoas muscle such that disc space and paraspinal infection cannot be completely excluded. 2. Chronic spondylosis at L3-4, L4-5 and L5-S1. There is mildly progressive spinal stenosis at L3-4 without definite acute findings. 3. These results will be called to the ordering clinician or representative by the Radiologist Assistant, and communication documented in the PACS or zVision Dashboard.    Echo Study Conclusions  - Left ventricle: The cavity size was normal. Systolic function was moderately to severely reduced. The estimated ejection fraction was in the range of 30% to 35%. There is akinesis of the basal-midinferoseptal myocardium. There is akinesis of  the basal-midinferior myocardium. Cannot exclude akinesis of the basalanteroseptal myocardium. There was an increased relative contribution of atrial contraction to ventricular filling. Doppler parameters are consistent with abnormal left ventricular relaxation (grade 1 diastolic dysfunction). - Ventricular septum: Septal motion showed paradox. - Aortic valve: Mild thickening and calcification, consistent with sclerosis. There was mild to moderate regurgitation. - Aorta: Ascending aortic diameter: 43 mm (S). - Ascending aorta: The ascending aorta was mildly dilated. - Mitral valve: There was mild regurgitation.   Pincus LargeJazma Y Jenasia Dolinar, DO 08/18/2014, 7:58 AM PGY-1, [facility] FPTS Intern pager: [phone], text pages welcome

## 2014-08-19 LAB — GLUCOSE, CAPILLARY
GLUCOSE-CAPILLARY: 134 mg/dL — AB (ref 70–99)
GLUCOSE-CAPILLARY: 141 mg/dL — AB (ref 70–99)

## 2014-08-19 MED ORDER — SENNOSIDES-DOCUSATE SODIUM 8.6-50 MG PO TABS: 2.0000 | ORAL_TABLET | Freq: Two times a day (BID) | ORAL | Status: DC

## 2014-08-19 MED ORDER — SORBITOL 70 % SOLN
960.0000 mL | TOPICAL_OIL | Freq: Once | ORAL | Status: DC
  Filled 2014-08-19: qty 240

## 2014-08-19 MED ORDER — CEFTRIAXONE SODIUM IN DEXTROSE 40 MG/ML IV SOLN: 2.0000 g | INTRAVENOUS | Status: AC

## 2014-08-19 MED ORDER — OXYCODONE HCL 5 MG PO TABS: 5.0000 mg | ORAL_TABLET | ORAL | Status: DC | PRN

## 2014-08-19 MED ORDER — HEPARIN SOD (PORK) LOCK FLUSH 100 UNIT/ML IV SOLN
250.0000 [IU] | INTRAVENOUS | Status: AC | PRN
  Administered 2014-08-19: 250 [IU]

## 2014-08-19 MED ORDER — OXYCODONE HCL 10 MG PO TABS: 10.0000 mg | ORAL_TABLET | ORAL | Status: DC | PRN

## 2014-08-19 MED ORDER — MORPHINE SULFATE 2 MG/ML IJ SOLN: 2.0000 mg | INTRAMUSCULAR | Status: DC | PRN

## 2014-08-19 NOTE — Discharge Summary (Signed)
Family Medicine Teaching [facility] Discharge Summary  Patient name: Carlos Andrews record number: [id number] Date of birth: [birth year]Age: 81 y.o.Gender: male Date of Admission: 3/4/2016Date of Discharge: 08/18/2013 Admitting Physician: Carlos Rising, MD  Primary Care Provider: MCDIARMID,TODD D, MD Consultants: ID, Neurosurgery  Indication for Hospitalization: Lower extremity weakness  Discharge Diagnoses/Problem List:  Patient Active Problem List   Diagnosis Date Noted  . Psoas abscess   . Depression 08/15/2014  . Bacteremia 08/15/2014  . S/P laminectomy   . Leg weakness   . Lumbar radiculopathy, acute   . Type 2 diabetes mellitus with diabetic chronic kidney disease   . Essential hypertension   . Leukocytosis   . Frequent falls   . Weakness of both legs 08/12/2014  . Spinal stenosis of lumbar region 05/10/2014  . Impairment of balance 05/10/2014  . Thenar atrophy, bilateral, right greater than left.  05/10/2014  . Urge incontinence 05/10/2014  . Impaired mobility and ADLs 05/10/2014  . At risk for falling 10/04/2013  . Recurrent falls 10/04/2013  . Skin ulcer of foot, left heel 02/23/2013  . Myopia with presbyopia of left eye 02/23/2013  . Open angle with borderline findings and low glaucoma risk in both eyes 02/23/2013  . Regular astigmatism 02/23/2013  . Presbyopia 02/23/2013  . Ptosis of right eyelid 02/23/2013  . Peripheral vascular disease, unspecified 04/21/2012  . Atherosclerosis of native arteries of extremity with intermittent claudication 04/21/2012  . GAIT DISTURBANCE 08/01/2008  . LUMBAR SPRAIN AND STRAIN 07/19/2008  . KIDNEY DISEASE, CHRONIC, STAGE III 03/13/2007  . Diabetes mellitus due to underlying condition with diabetic polyneuropathy 08/07/2006  . HYPERLIPIDEMIA 08/07/2006  . OBESITY, NOS  08/07/2006  . Unspecified hereditary and idiopathic peripheral neuropathy 08/07/2006  . HYPERTENSION, BENIGN SYSTEMIC 08/07/2006  . 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 OSTEOARTHRITIS, LOWER LEG 08/07/2006  . Proteinuria 08/07/2006    Disposition: To SNF  Discharge Condition: Stable  Discharge Exam:  Blood pressure 114/62, pulse 84, temperature 98.3 F (36.8 C), temperature source Oral, resp. rate 16, SpO2 99 %.  General: Fair appearing, appears chronically ill, NAD Cardiovascular: Regular rate and rhythm, no murmur Respiratory: Clear to auscultation bilaterally Abdomen: Firm, non-tender, non-distended, reducible midline hernia Extremities: WWP, improved ROM Neuro: Persistent weakness BLE. A&Ox2.  Brief Hospital Course:  Carlos Andrews is a 81 y.o. male who presented with bilateral lower extremity weakness. PMH is significant for T2DM, HTN, and recurrent falls.  Back Pain and lower extremity weakness: patient presented with worsening lower extremity weakness and pain. DG lumbar and thoracic significant for degenerative spondylosis. 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 MRI showing moderate to severe lumbar stenosis and bulge with suspected infection. Neurosurgery was also consulted due to patient have continued severe pain and decreased rectal tone. Patient was taken to have L2-L3 laminectomy. He had a leukocytosis and fevers. Found to have bacteremia on blood cultures. Final diagnosis is discitis, psaoas abscess, and bacteremia due to microaerophilic strep. He is getting Ceftriaxone 2gm IV through PICC line. He will continue this for 6 weeks. Started on 3/7 (1 of 42). He will need weekly cbc and bmp. ID was following patient and he will need to follow with them as an outpatient. PT/OT were consulted and suggest SNF for patient. While working with therapist patient had some improvement in mobility and lower extremity weakness.   CHF: Patient noted  to have both systolic and diastolic HF. Echo performed showed EF of 30-35% with akenisis of myocardium; aortic valve  calcification with regurgitation. Results below. NYHA score 3. Unable tos tart appropriate therapy while in the hospital due to unstable vitals. Consider medically treating as outpatient.   Delirium: Patient became acutely delirious during admission. CT head showed no acute findings; shows atrophy and chronic microvascular disease. Believe obstipation may have played a factor in acute delirium. After BM patient did become more oriented. However believe he has baseline dementia that needs further work-up.  Depression: Patient stating her no longer feels like being alive. Feels as though he is a burden. Thinks he will not survive this. He was started on Sertraline.   T2DM: Patient takes Amaryl at home. A1c this admission 5.5. However during hospitalization CBGs 200-300. Elevated sugars likely due to infection and pain. Patient was placed on moderate SSI with HS coverage and Lantus for better control.  The patient's other chronic conditions were stable during this hospitalization and the patient was continued on home medications.    Issues for Follow Up:  1. Repeat CBC and BMP weekly while on Abx 2. Follow-up in ID clinic in 4-6 weeks 3. Consider treatment for CHF if HR and BP tolerate; aldactone, ICD 4. Follow-up on depressive symtpoms 5. BP medications held due soft/normotensive pressures. Add back as needed.  6. Dysphagia 3 diet 7. Monitor pain and adjust as needed 8. Monitor sugars; regimen will likely need to be adjusted 9. Work-up dementia further  Significant Procedures: PICC placement  Significant Labs and Imaging:   Last Labs      Recent Labs Lab 08/15/14 0621 08/16/14 0606 08/17/14 0620  WBC 10.7* 10.6* 9.6  HGB 11.5* 10.6* 11.5*  HCT 33.9* 31.0* 34.5*  PLT 140* 164 194      Last Labs      Recent Labs Lab 08/12/14 1658  08/13/14 0423 08/14/14 0745 08/15/14 0621 08/16/14 0606  NA 139 135 138 135 134*  K 4.2 3.8 3.9 4.2 3.9  CL 105 103 105 105 106  CO2 GLUCOSE 350* 334* 259* 236* 264*  BUN 26* 29* 30* 24* 23  CREATININE 1.54* 1.56* 1.40* 1.17 1.22  CALCIUM 8.5 8.0* 7.9* 7.9* 8.0*  ALKPHOS 104 --  --  --  --   AST 22 --  --  --  --   ALT 48 --  --  --  --   ALBUMIN 2.4* --  --  --  --      Echo(08/16/14) Study Conclusions  - Left ventricle: The cavity size was normal. Systolic function was moderately to severely reduced. The estimated ejection fraction was in the range of 30% to 35%. There is akinesis of the basal-midinferoseptal myocardium. There is akinesis of the basal-midinferior myocardium. Cannot exclude akinesis of the basalanteroseptal myocardium. There was an increased relative contribution of atrial contraction to ventricular filling. Doppler parameters are consistent with abnormal left ventricular relaxation (grade 1 diastolic dysfunction). - Ventricular septum: Septal motion showed paradox. - Aortic valve: Mild thickening and calcification, consistent with sclerosis. There was mild to moderate regurgitation. - Aorta: Ascending aortic diameter: 43 mm (S). - Ascending aorta: The ascending aorta was mildly dilated. - Mitral valve: There was mild regurgitation.   Mr Lumbar Spine Wo Contrast 08/13/2014 IMPRESSION: 1. Compared with the prior MRI from 02/17/2009, there are significantly progressive changes at the L2-3 disc space level with progressive disc bulging and discal T2 hyperintensity. These findings compress the thecal sac, resulting in severe spinal and biforaminal stenosis. Although the discal hyperintensity is new, there is no  progressive bone destruction to suggest osteomyelitis. However, there is apparently a history of spinal osteomyelitis, and new T2  hyperintensity is demonstrated within the right psoas muscle such that disc space and paraspinal infection cannot be completely excluded. 2. Chronic spondylosis at L3-4, L4-5 and L5-S1. There is mildly progressive spinal stenosis at L3-4 without definite acute findings. 3. These results will be called to the ordering clinician or representative by the Radiologist Assistant, and communication documented in the PACS or zVision Dashboard. Electronically Signed: By: Carey Bullocks M.Andrews. On: 08/13/2014 12:07    Results/Tests Pending at Time of Discharge: None  Discharge Medications:    Medication List    STOP taking these medications       glimepiride 2 MG tablet  Commonly known as: AMARYL      TAKE these medications       acetaminophen 325 MG tablet  Commonly known as: TYLENOL  Take 650 mg by mouth every 6 (six) hours as needed for mild pain.     aspirin 81 MG tablet  Take 81 mg by mouth daily.     calcium-vitamin Andrews 250-125 MG-UNIT per tablet  Commonly known as: OSCAL WITH Andrews  Take 1 tablet by mouth daily.     cefTRIAXone 40 MG/ML IVPB  Commonly known as: ROCEPHIN  Inject 50 mLs (2 g total) into the vein daily.     Fish Oil Oil  Take 1,000 mg by mouth daily.     food thickener Powd  Commonly known as: THICK IT  Take 1 Container by mouth as needed (Per Dsyphagia 3 Diet, to make liquids Nectar Thi\ck).     gabapentin 300 MG capsule  Commonly known as: NEURONTIN  Take 1 capsule (300 mg total) by mouth 2 (two) times daily.     insulin aspart 100 UNIT/ML injection  Commonly known as: NOVOLOG  Inject 3 Units into the skin 3 (three) times daily with meals.     insulin glargine 100 UNIT/ML injection  Commonly known as: LANTUS  Inject 0.2 mLs (20 Units total) into the skin at bedtime.     lisinopril 20 MG tablet  Commonly known as: PRINIVIL,ZESTRIL  Take 1 tablet (20 mg total) by mouth at bedtime.      multivitamin tablet  Take 1 tablet by mouth daily.     Oxycodone HCl 10 MG Tabs  Take 1 tablet (10 mg total) by mouth every 4 (four) hours as needed for moderate pain or severe pain.     pyridOXINE 100 MG tablet  Commonly known as: VITAMIN B-6  3 tabs once daily.     senna-docusate 8.6-50 MG per tablet  Commonly known as: Senokot-S  Take 2 tablets by mouth 2 (two) times daily.     vitamin B-12 1000 MCG tablet  Commonly known as: CYANOCOBALAMIN  Take 1,000 mcg by mouth daily.     Vitamin C 500 MG Chew  Chew by mouth daily.        Discharge Instructions: Please refer to Patient Instructions section of EMR for full details. Patient was counseled important signs and symptoms that should prompt return to medical care, changes in medications, dietary instructions, activity restrictions, and follow up appointments.   Follow-Up Appointments: Follow-up Information    Follow up with Judyann Munson, MD. Schedule an appointment as soon as possible for a visit in 5 weeks.   Specialty: Infectious Diseases   Contact information:   [address] [place] [address] [phone]  Pincus LargeJazma Y Dalores Weger, DO 08/19/2014, 12:10 PM PGY-1, [facility]

## 2014-08-19 NOTE — Discharge Planning (Signed)
Patient will discharge today per MD order. [facility] will [facility] to [facility] RN to call report prior to transportation to [phone] Transportation: [facility]  CSW sent discharge summary to SNF for review.  Packet is complete.  RN, patient and family aware of discharge plans.  Vickii PennaGina Consandra Laske, LCSWA [phone]  Psychiatric & Orthopedics (5N 1-16) Clinical Social Worker

## 2014-08-20 ENCOUNTER — Telehealth: Payer: Self-pay | Admitting: Family Medicine

## 2014-08-20 NOTE — Telephone Encounter (Signed)
Speaking with Carlos Andrews and is requesting a replacement pain medication for oxycodone HCl 10 mg. The patient's pain medication hasn't come in yet. Will give norco 10 mg x 1 and then patient will be able to get his prescribed pain medication tonight.   Myra RudeJeremy E Pattrick Bady, MD PGY-2, [facility] 08/20/2014, 5:39 PM

## 2014-08-21 ENCOUNTER — Telehealth: Payer: Self-pay | Admitting: Family Medicine

## 2014-08-21 LAB — CULTURE, BLOOD (ROUTINE X 2)
CULTURE: NO GROWTH
CULTURE: NO GROWTH

## 2014-08-21 NOTE — Telephone Encounter (Signed)
Received a call [facility] that patient was confused early this morning and pulled out his PICC line. He was receiving CTX 2 g IV for disciitis. Spoke with Levy Sjogrenracy Smith and informed him that he should call pharmacy and request CTX 2 g IM for today. He can go for PICC line insertion tomorrow.   Myra RudeJeremy E Kaelyn Nauta, MD PGY-2, [facility] 08/21/2014, 11:14 AM

## 2014-08-22 ENCOUNTER — Other Ambulatory Visit: Payer: Self-pay | Admitting: Family Medicine

## 2014-08-22 ENCOUNTER — Encounter: Payer: Self-pay | Admitting: Family Medicine

## 2014-08-22 ENCOUNTER — Non-Acute Institutional Stay: Payer: Medicare Other | Admitting: Family Medicine

## 2014-08-22 DIAGNOSIS — E7800 Pure hypercholesterolemia, unspecified: Secondary | ICD-10-CM

## 2014-08-22 DIAGNOSIS — M4646 Discitis, unspecified, lumbar region: Secondary | ICD-10-CM

## 2014-08-22 DIAGNOSIS — Z9889 Other specified postprocedural states: Secondary | ICD-10-CM

## 2014-08-22 DIAGNOSIS — I5022 Chronic systolic (congestive) heart failure: Secondary | ICD-10-CM

## 2014-08-22 DIAGNOSIS — I712 Thoracic aortic aneurysm, without rupture: Secondary | ICD-10-CM

## 2014-08-22 DIAGNOSIS — R39198 Other difficulties with micturition: Secondary | ICD-10-CM

## 2014-08-22 DIAGNOSIS — B999 Unspecified infectious disease: Secondary | ICD-10-CM

## 2014-08-22 DIAGNOSIS — E1122 Type 2 diabetes mellitus with diabetic chronic kidney disease: Secondary | ICD-10-CM

## 2014-08-22 DIAGNOSIS — R1312 Dysphagia, oropharyngeal phase: Secondary | ICD-10-CM | POA: Insufficient documentation

## 2014-08-22 DIAGNOSIS — M48061 Spinal stenosis, lumbar region without neurogenic claudication: Secondary | ICD-10-CM

## 2014-08-22 DIAGNOSIS — R3989 Other symptoms and signs involving the genitourinary system: Secondary | ICD-10-CM

## 2014-08-22 DIAGNOSIS — F32A Depression, unspecified: Secondary | ICD-10-CM

## 2014-08-22 DIAGNOSIS — R410 Disorientation, unspecified: Secondary | ICD-10-CM

## 2014-08-22 DIAGNOSIS — N183 Chronic kidney disease, stage 3 unspecified: Secondary | ICD-10-CM

## 2014-08-22 DIAGNOSIS — I77810 Thoracic aortic ectasia: Secondary | ICD-10-CM

## 2014-08-22 DIAGNOSIS — M4806 Spinal stenosis, lumbar region: Secondary | ICD-10-CM

## 2014-08-22 DIAGNOSIS — I509 Heart failure, unspecified: Secondary | ICD-10-CM

## 2014-08-22 DIAGNOSIS — K6812 Psoas muscle abscess: Secondary | ICD-10-CM

## 2014-08-22 DIAGNOSIS — I5020 Unspecified systolic (congestive) heart failure: Secondary | ICD-10-CM | POA: Insufficient documentation

## 2014-08-22 DIAGNOSIS — G8220 Paraplegia, unspecified: Secondary | ICD-10-CM | POA: Insufficient documentation

## 2014-08-22 DIAGNOSIS — I351 Nonrheumatic aortic (valve) insufficiency: Secondary | ICD-10-CM

## 2014-08-22 DIAGNOSIS — N189 Chronic kidney disease, unspecified: Secondary | ICD-10-CM

## 2014-08-22 DIAGNOSIS — I10 Essential (primary) hypertension: Secondary | ICD-10-CM

## 2014-08-22 DIAGNOSIS — L89622 Pressure ulcer of left heel, stage 2: Secondary | ICD-10-CM

## 2014-08-22 DIAGNOSIS — F329 Major depressive disorder, single episode, unspecified: Secondary | ICD-10-CM

## 2014-08-22 HISTORY — DX: Psoas muscle abscess: K68.12

## 2014-08-22 HISTORY — DX: Chronic systolic (congestive) heart failure: I50.22

## 2014-08-22 HISTORY — DX: Unspecified systolic (congestive) heart failure: I50.20

## 2014-08-22 HISTORY — DX: Nonrheumatic aortic (valve) insufficiency: I35.1

## 2014-08-22 HISTORY — DX: Discitis, unspecified, lumbar region: M46.46

## 2014-08-22 HISTORY — DX: Other difficulties with micturition: R39.198

## 2014-08-22 LAB — CBC AND DIFFERENTIAL
HCT: 32 % — AB (ref 41–53)
HEMOGLOBIN: 10.7 g/dL — AB (ref 13.5–17.5)
NEUTROS ABS: 4 /uL
Platelets: 224 10*3/uL (ref 150–399)
WBC: 5.9 10^3/mL

## 2014-08-22 LAB — BASIC METABOLIC PANEL
BUN: 15 mg/dL (ref 4–21)
Creatinine: 1 mg/dL (ref 0.6–1.3)
Glucose: 177 mg/dL
Potassium: 3.8 mmol/L (ref 3.4–5.3)
Sodium: 141 mmol/L (ref 137–147)

## 2014-08-22 NOTE — Progress Notes (Signed)
Patient ID: Carlos Andrews, male   DOB: 06/22/1932, 81 y.o.   MRN: [id number] [facility]  Visit  Primary Care Provider: Clearance Coots Location of Care: [facility] Visit Information: Admission to [facility] Patient accompanied by patient Source(s) of information for visit: patient, spouse/SO, relative(s), nursing home and past medical records  Chief Complaint:  Chief Complaint  Patient presents with  . Admission to SNF    Nursing Concerns: Pulling out PICC line, pulling out catheter Nutrition Concerns: Dysphagia Wound Care Nurse Concerns: None OT: Plan Therapeutic exercise, Therapeutic activities, Neuromuscular Re-education, ADL retraining. PT: Requiring Maxiaml assist and extensive curing to sit EOB. Lack of truncal endurance.  Requiring maximal assist with transfers to [facility].  Unable to initiate sit-to-stand. Staff will need to use [facility] for transfers. ST: 0/3 recall. Visual stimulation improves recall 2/3.    HISTORY OF PRESENT ILLNESS:  [name] was admitted to [facility] on 08/12/14 with acute back pain and bilateral leg weakness, and fever.  MRI showed severe spinal stenosis with near complete effacement of thecal sac at L2-L3 level.  Additionally, T2 hyperintensity signal in L2-3 disc c/w discitis and Ill defined T2 hyperintensity in right psoas muscle c/w psoas abscess. Patient was started on broad spectrum antibiotics regiment that included vancomycin.   Dr Jeremy Johann (NS) performed L2-L3 decompressive lumbar laminectomy on 08/09/33 without complications. Mr Strength had decreased flexion of hips and knees bilaterally, as well as decreased plantar flexion bilaterally.  PT recommended SNF after Mr Bukhari was declined by CIR.    Laminectomy tissue swab and 1 out of 2 blood cultures grew microaerophilic Streptococcus.  Infectious Disease consultation by Dr Carlyle Basques recommended 6 weeks of Ceftriaxone 2 grams IV daily for 6 weeks with 08/15/14 as Mr Corkins start  date. While the TTE ordered to look for evidence of endocarditis did not show any, it did show a reduced ejection fraction of 30 to 35% with inferior akinetic wall segments.    Patient was delirious throughout the hospital stay.    [name] was started on basal and prandial insulins during the hospitalization.    Speech therapy found delayed throat clearing concerning for penetration, but MBS patient demonstrated ability to expectorate penetrant. ST recommended Dysphagia III diet with Honey thick Liquids.   Spinal stenosis of lumbar region - Post-op day 9 after L2-L3 laminectomy - Pain in lumbar back and back of legs is very disturbing to patient - As needed oxycodone and APAP.  Given two oxycodone tab in 2 days   Skin ulcer of foot, left heel - History of superficial ulceration left posterior heel noted on Home Visit by Dr Kalissa Grays on 05/11/15 - Patient received no additional care of wound. - No pain in wound.    S/P laminectomy   Pure hypercholesterolemia (Chronic) - [name] has declined use of statins in past - Akinetic inferior wall motion on TEE recently.   Psoas abscess, right - Uncertain if patient's pain complaint localizes to right side - Associated L2-L3 Discitis - IV Ceftiraxone for 6 weeks, start on 08/15/14.  End on 09/26/14. - Patient pulled out PICC line on 08/21/14.  Ceftriaxone given IM on 08/22/14    Mild to Moderate aortic regurgitation - Associated mild ascending aorta   Mild ascending aorta dilation - 43 mm found on TTE during workup for possible endocarditis   Lower paraplegia - Onset 08/12/14 - Secondary to lumbar spinal stenosis with bulging L2-L3 disc compromising biforamins.  - Treated with L2-L3 laminectomy on  08/13/14.  - Peripheral neurogenic paraplegia predominately in anterior femoral nerve muscle distributions - Impaired bilateral hip flexion, bilateral knee extension, and bilateral plantar flexion.    KIDNEY DISEASE, CHRONIC, STAGE III (Chronic) -  Lab  Results  Component Value Date   CREATININE 1.22 08/16/2014   eGFR = 55 mL/min - on ACEI lisinopril    HYPERTENSION, BENIGN SYSTEMIC (Chronic) - Longstanding problem for pt.  - On Lisinopril 10 mg daily - EF 30 to 35% with akinetic inferior wall -    Heart failure with reduced ejection fraction - New diagnosis for patient - Suspect an ischemic component given inferior wall akinesis mixed with Hypertension cause.  - On ACEI.  Not on betablocker, aldosterone antagonist.   Dysphagia, oropharyngeal phase - Difficulty with handling pooling in oropharynx.  Able to clear with cough. - ST recommened Dysphagia III with HTL  - May be related to patient's current delirium    Difficulty in voiding - Difficulty initiating void.  - No history of voiding difficulties. - Pt had foley catheter during admission.    Depression - Started on Sertraline during this admission. -    Acute delirium - Present throughout recent hospitalization. - Patient had a normal MMSE in December 2015 on Dr Gabreille Dardis's home visit exam - Acute change from baseline cognition. [name] was unable to say the days of the week backwards on exam today.  He failed several simple reasoning tasks. His level of consciousness is alert, but his speech is halting, perseverative and preoccupied with how doors on his room closet are hanging out of line.         Outpatient Encounter Prescriptions as of 08/22/2014  Medication Sig  . acetaminophen (TYLENOL) 325 MG tablet Take 650 mg by mouth every 6 (six) hours as needed for mild pain.  . Ascorbic Acid (VITAMIN C) 500 MG CHEW Chew by mouth daily.    Marland Kitchen aspirin 81 MG tablet Take 81 mg by mouth daily.    . calcium-vitamin D (OSCAL WITH D 250-125) 250-125 MG-UNIT per tablet Take 1 tablet by mouth daily.    . cefTRIAXone (ROCEPHIN) 40 MG/ML IVPB Inject 50 mLs (2 g total) into the vein daily.  . Fish Oil OIL Take 1,000 mg by mouth daily.   Marland Kitchen gabapentin (NEURONTIN) 300 MG capsule Take 1  capsule (300 mg total) by mouth 2 (two) times daily.  . insulin aspart (NOVOLOG) 100 UNIT/ML injection Inject 3 Units into the skin 3 (three) times daily with meals.  . insulin glargine (LANTUS) 100 UNIT/ML injection Inject 0.2 mLs (20 Units total) into the skin at bedtime.  Marland Kitchen lisinopril (PRINIVIL,ZESTRIL) 20 MG tablet Take 1 tablet (20 mg total) by mouth at bedtime.  . Multiple Vitamin (MULTIVITAMIN) tablet Take 1 tablet by mouth daily.    . Oxycodone HCl 10 MG TABS Take 1 tablet (10 mg total) by mouth every 4 (four) hours as needed.  . Pyridoxine HCl (VITAMIN B-6) 100 MG tablet 3 tabs once daily.   Marland Kitchen senna-docusate (SENOKOT-S) 8.6-50 MG per tablet Take 2 tablets by mouth 2 (two) times daily.  . food thickener (THICK IT) POWD Take 1 Container by mouth as needed (Per Dsyphagia 3 Diet, to make liquids Nectar Thi\ck).  . vitamin B-12 (CYANOCOBALAMIN) 1000 MCG tablet Take 1,000 mcg by mouth daily.    . [DISCONTINUED] morphine 2 MG/ML injection Inject 1 mL (2 mg total) into the vein every 4 (four) hours as needed.   Allergies  Allergen Reactions  .  Naprosyn [Naproxen] Other (See Comments)    GI upset  . Rofecoxib     REACTION: dizzy  . Tramadol Hcl     REACTION: vomiting   History Patient Active Problem List   Diagnosis Date Noted  . Dysphagia, oropharyngeal phase 08/22/2014  . Lower paraplegia 08/22/2014  . Difficulty in voiding 08/22/2014  . 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 Acute delirium   . Psoas abscess   . Depression 08/15/2014  . Bacteremia 08/15/2014  . S/P laminectomy   . Type 2 diabetes mellitus with diabetic chronic kidney disease   . Essential hypertension   . Frequent falls   . Spinal stenosis of lumbar region 05/10/2014  . Impairment of balance 05/10/2014  . Thenar atrophy, bilateral, right greater than left.  05/10/2014  . Urge incontinence 05/10/2014  . Impaired mobility and ADLs 05/10/2014  . At risk for falling 10/04/2013  . Recurrent falls 10/04/2013  .  Skin ulcer of foot, left heel 02/23/2013  . Myopia with presbyopia of left eye 02/23/2013  . Open angle with borderline findings and low glaucoma risk in both eyes 02/23/2013  . Regular astigmatism 02/23/2013  . Presbyopia 02/23/2013  . Ptosis of right eyelid 02/23/2013  . Peripheral vascular disease, unspecified 04/21/2012  . Atherosclerosis of native arteries of extremity with intermittent claudication 04/21/2012  . GAIT DISTURBANCE 08/01/2008  . LUMBAR SPRAIN AND STRAIN 07/19/2008  . KIDNEY DISEASE, CHRONIC, STAGE III 03/13/2007  . Pure hypercholesterolemia 08/07/2006  . OBESITY, NOS 08/07/2006  . Unspecified hereditary and idiopathic peripheral neuropathy 08/07/2006  . HYPERTENSION, BENIGN SYSTEMIC 08/07/2006  . 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 OSTEOARTHRITIS, LOWER LEG 08/07/2006  . Proteinuria 08/07/2006   Past Medical History  Diagnosis Date  . DIABETES MELLITUS, II, COMPLICATIONS 4/65/6812  . Esophageal perforation 1993    managed at [facility]  . PPD positive 1993    Found at [facility]  . Spinal stenosis of lumbar region at multiple levels   . Proteinuria 08/07/2006    Qualifier: Diagnosis of  By: Drucie Ip    . Open angle with borderline findings and low glaucoma risk in both eyes 02/23/2013  . Regular astigmatism 02/23/2013  . Presbyopia 02/23/2013  . Ptosis of left eyelid 02/23/2013  . At risk for falling 10/04/2013  . GAIT DISTURBANCE 08/01/2008        . HYPERLIPIDEMIA 08/07/2006    Qualifier: Diagnosis of  By: Drucie Ip    . HYPERTENSION, BENIGN SYSTEMIC 08/07/2006    Qualifier: Diagnosis of  By: Drucie Ip    . 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 LUMBAR SPRAIN AND STRAIN 07/19/2008    Qualifier: Diagnosis of  By: Lindell Noe MD, Jeneen Rinks    . Myopia with presbyopia of left eye 02/23/2013  . OBESITY, NOS 08/07/2006    Qualifier: Diagnosis of  By: Drucie Ip    . OSTEOARTHRITIS, LOWER LEG 08/07/2006    Qualifier: Diagnosis  of  By: Drucie Ip    . PERIPHERAL EDEMA 08/01/2008    Qualifier: Diagnosis of  By: Walker Kehr MD, Patrick Jupiter    . Peripheral vascular disease, unspecified 04/21/2012    Severe tibial occlusive disease (Dr Kellie Simmering, [facility], 04/2002)     . Recurrent falls 10/04/2013  . Skin ulcer of foot, left heel 02/23/2013  . Type II or unspecified type diabetes mellitus without mention of complication, not stated as uncontrolled 08/07/2006    Qualifier: Diagnosis of  By: Drucie Ip    .  Unspecified hereditary and idiopathic peripheral neuropathy 08/07/2006        . Proteinuria 08/07/2006  . Impairment of balance 05/10/2014  . Urge incontinence 05/10/2014  . Impaired mobility and ADLs 05/10/2014  . Diabetes mellitus due to underlying condition with diabetic polyneuropathy 08/07/2006    Qualifier: Diagnosis of  By: Drucie Ip    . Difficulty in voiding 08/22/2014  . Psoas abscess, right 08/22/2014   Past Surgical History  Procedure Laterality Date  . Neck surgery  1993    s/p C1-2, C2-3 anterior cervical diskectomies, partial corpectomies through thoracotomy at [place]  . Cataract extraction      Cataract surgery   . Thoracotomy  1993    s/p T3-T4 osteomyelitis with multiple open drainages including thoracotomy at [place]  . Spinal cord decompression  1993    s/p T1-T2 spinal cord compression secondary to granualtion tissue and abscess material secondary to previous mediastinitis at [place]  . Jejunostomy feeding tube  1993    s/p Feeding jejunostomy [address]  . Esophageal perforation  1993    Hx of esophageal perforation complication 9242  . Lumbar epidural injection  2010    Epidural corticosteroid injection for L2-3 & L4-5 severe spinal stenosis at [place] on 9/10, 10/10, 11/10    . Lumbar laminectomy/decompression microdiscectomy N/A 08/13/2014    Procedure: LUMBAR LAMINECTOMY ;  Surgeon: Jovita Gamma, MD;  Location: [place] [facility];  Service: Neurosurgery;  Laterality: N/A;    Family History  Problem Relation Age of Onset  . Diabetes Mother   . Diabetes Father   . Hypertension Father   . Other Father     amputaion  . Diabetes Son   . Hypertension Son     reports that he quit smoking about 27 years ago. His smoking use included Cigarettes. He has never used smokeless tobacco. He reports that he does not drink alcohol or use illicit drugs.  Basic Activities of Daily Living   ADLs Independent Needs Assistance Dependent  Bathing   x  Dressing   x  Ambulation   x  Toileting  x   Eating  x      Instrumental Activities of Daily Living- Prior to this recent acute illness  IADL Independent Needs Assistance Dependent  Cooking   x  Housework   x  Manage Medications x    Manage the telephone x    Shopping for food, clothes, Meds, etc   x  Use transportation   x  Manage Finances x      Falls in the past six months:   yes  Diet:  Dysphagia III with HTL Supplemental shakes:  no  Review of Systems  Patient has ability to communicate answers to ROS: No, delirium See HPI  Geriatric Syndromes: Constipation yes ,   Incontinence yes  Dizziness    Syncope    Skin problems yes   Visual Impairment yes   Hearing impairment no  Eating impairment yes  Impaired Memory or Cognition yes   Behavioral problems yes   Sleep problems  Weight loss  Pain:  Pain Location: Back, left leg, right leg Pain Rating: He rates his pain as severe.  Pain Duration: days Pain Therapies: Tylenol and Oxycontin Pain Response to Therapies: unknown Bowel Movement Difficulty: yes  Dyspnea: Dyspnea Rating: none   .   PHYSICAL EXAM:. Wt Readings from Last 3 Encounters:  08/08/14 165 lb (74.844 kg)  01/10/14 172 lb (78.019 kg)  10/04/13 187 lb (  84.823 kg)   Temp Readings from Last 3 Encounters:  08/19/14 98 F (36.7 C)   08/08/14 98 F (36.7 C) Oral  08/06/14 98.1 F (36.7 C) Oral   BP Readings from Last 3 Encounters:  08/19/14 138/74  08/08/14 117/70   08/06/14 137/78   Pulse Readings from Last 3 Encounters:  08/19/14 86  08/08/14 94  08/06/14 93    General: alert, cooperative, distracted, delirious, slowed mentation, well nourished, pleasant, clean, groomed HEENT:  No scleral icterus, no nasal secretions,Oromucosa moist and no erythema or lesion Neck:  Supple, No JVD, no lymphadenopathy CV:  RRR, no murmur, no ankle swelling RESP: No resp distress or accessory muscle use.  Clear to ausc bilat. No wheezing, no rales, no rhonchi.  ABD:  Soft,(+) suprapubic tenderness, non-distended, +bowel sounds, no masses EXT: Warm and well perfused and No edema  Left Heel posterior with superficial ulceration ~13 mm by 8 mm, pale pink base, serous surface, no drainage.  with blushing surrounding skin.  Neurologic:Cranial nerves normal except right eye ptosis;  Motor   Right grip 5/5    Left grip 5/5 Right elbow 5/5  Left elbow 5/5 Right hip flex 3/5       Left hip flex 3/5 Right Knee flex 3/5   Left knee flex 3/5 Right Ankle PF 3/5   Left Ankle PF 3/5 Right Ankle DF 4/5   Left Ankle DF 4/5  Babinski bilaterally equivocal to downgoing.      MMSE - Mini Mental State Exam 05/10/2014  Orientation to time 5  Orientation to Place 5  Registration 3  Attention/ Calculation 5  Recall 2  Language- name 2 objects 2  Language- repeat 1  Language- follow 3 step command 3  Language- read & follow direction 1  Write a sentence 1  Copy design 0  Total score 28     Years of Education: 16 +  Assessment and Plan:   See Problem List for individual problem's assessment and plans.   Family communication: I spoke with Mr Servellon son, Mr Sonnie Pawloski, Jr([phone]), and Mr Blais wife, Luke Falero [phone])   Advanced Directives (MOST form, Living Will, HCPOA):  Patient identified his wife, Korey Prashad, daughter Kashius Dominic [phone]) and/or son Kylie Gros ([phone]) as his emergency contacts.  Patient identifies his wife,  Olajuwon Fosdick, as the person to be his agent for health care decisions ( office visit 10/04/13 with Dr Romona Murdy)    Follow Up:  Next 30 days unless acute issues arise.

## 2014-08-22 NOTE — Assessment & Plan Note (Signed)
Post op day #9 Pain in back and bilateral posterior proximal legs. Balance pain control with opiate related delirium Will increase Gabapentin to 300 mg THREE TIMES DAILY from TWICE DAILY once Mr Arville Carearks toleration of scheduled opiate therapy is assessed.

## 2014-08-22 NOTE — Assessment & Plan Note (Signed)
Patient with acute change from cognitive baseline.  Mr Carlos Andrews scored 28 out of 30 on MMSE between 3 to 4 months ago.  He was unable to repeat days of week backwards He incorrectly answered simple reasoning questions. He is perseverating about how his closet doors are hung. Head CT (08/18/14) showed no acute abnormalities.  Portable Chest Xray (08/17/14) without acute abnormalities.   [facility] which interferes with necessary care for patient.  He has not been unsafe to self or others, including staff so far.   Predisposing conditions for Delirium: Age, dependency in ADLs, medical complains of-morbidities, male) Precipitating conditions for Delirium: New medications including opiates, increase in gabapentin dose, active infection, poor vision, possible urinary retention, recent constipation, post-surgical, recent hyponatremia, uncontrolled pain  Plan: Recheck serum sodium. TSH.          Schedule oxycodone 5 mg QID with 2.5 mg as needed every 4          Schedule APAP 500 mg QID          Treatment of Infections           Get family to bring in patient's glasses          Check post void residual           Monitor for BM at least twice a week.            Mobilize patient and match expected performance to ability as delirium clears.            Can start a low dose of Risperione 0.5 mg daily if patient's behavior prevents provision of necessary care for him.

## 2014-08-22 NOTE — Assessment & Plan Note (Signed)
New problem for patient May be related to his delirium state rather than a neuromuscular issue. Will repeat ST evaluation when delirium clears. Diet is currently Dysphagia III with HTL

## 2014-08-22 NOTE — Assessment & Plan Note (Signed)
New problem Peripheral neurogenic origin from L2-L3 bilateral foraminal encroachment and thecal sac effacement.  S/P Decompressive Laminectomy 08/13/14. [facility]

## 2014-08-22 NOTE — Assessment & Plan Note (Signed)
New problem Suspect secondary to patient's hypertension, +/- role of patient's moderate Aortic regurg. ACC/AHA recommendation is for annual TTE to monitor for progression of ascending aorta dilation if Aorta is > 40 mm.   Patient's proximal Aorta measured at 43 mm on 08/16/14

## 2014-08-22 NOTE — Assessment & Plan Note (Addendum)
New issue Sertraline 25 mg daily Based on patient saying he is no longer feels like being alive and his being a burden while in hospital. Subtherapeutic antidepressants in older adults is ineffective. Will continue Sertralinefor now, but at standard dose 50 mg daily, with re-evaluation of need in 4 weeks.

## 2014-08-22 NOTE — Assessment & Plan Note (Signed)
Stable eGFR 55 ml/min Avoid nephrotoxins.

## 2014-08-22 NOTE — Assessment & Plan Note (Signed)
Previously well controlled with glimepiride Started on Lantus and prandial novolog with SSI during hospitalization for infection related insulin resistance with hyperglycemia  Plan continue Lantus 20 units at bedtime and Novolog 3 units with meals Increase CBGs to THREE TIMES DAILY before meals with SSI

## 2014-08-22 NOTE — Assessment & Plan Note (Signed)
New problem Discitis at L2-L3 disc Hard to decide if Discitis caused right psoas muscle infection or the other way around.  ID consult recommended 6 weeks of IV rocephin to cover microaerophilic Streptococcus that was cultured from swab of laminectomy site and 1/2 blood culture bottles.  End Date 09/26/14.  Fullow up with Dr Drue SecondSnider in 6 weeks at ID clinic.

## 2014-08-22 NOTE — Assessment & Plan Note (Signed)
New pressure ulceration of previous calloused left heel Superficial stage II Pressure ulcer Rx foam island, Prevalon boots, heel suspension off mattress.

## 2014-08-22 NOTE — Assessment & Plan Note (Signed)
New problem Suspect Mixed origin of systolic failure.  Ischemic and Hypertension. Pro BNP 104 in 2010.  No evidence of volume overload at this time.  Tolerating ACEI. Will start Carvedilol as tolerated. Patient may tolerate aldactone antagonist in future despite CKD-III On daily Aspirin

## 2014-08-22 NOTE — Assessment & Plan Note (Signed)
New problem Patient had difficulty initiating void  Pt did have foley cath during recent hospitalizations.  Will check PVR with I&O cath. Send urine for urinalysis and culture.  Patient is already on Rocephin.

## 2014-08-22 NOTE — Assessment & Plan Note (Signed)
New problem Infectious Disease consult, Dr Drue SecondSnider, recommended 6 weeks of IV ceftriaxone 2 g daily starting from 08/15/14.  End date 09/26/14.  Monitoring CBC and BMET weekly ordered as recommended by ID consult Will consult with [facility] to help with management of IV therapy at Nursing home.  NH request to arrange outpatient consultation with Dr Drue SecondSnider at ID clinic in 6 weeks.  Ordered for NH to arrange re-placement of PICC line tomorrow.  Pt to receive Ceftriaxone 2 g IM daily until IV access achieved.

## 2014-08-22 NOTE — Assessment & Plan Note (Signed)
Continue Lisinopril 

## 2014-08-22 NOTE — Assessment & Plan Note (Signed)
Start atorvastatin for likely ischemic heart disease based on akinetic inferior wall on TTE

## 2014-08-23 ENCOUNTER — Encounter: Payer: Self-pay | Admitting: Family Medicine

## 2014-08-23 ENCOUNTER — Ambulatory Visit (HOSPITAL_COMMUNITY)
Admission: RE | Admit: 2014-08-23 | Discharge: 2014-08-23 | Disposition: A | Discharge status: Home / Self Care | Payer: Medicare Other | Source: Ambulatory Visit | Attending: Interventional Radiology | Admitting: Interventional Radiology

## 2014-08-23 ENCOUNTER — Other Ambulatory Visit: Payer: Self-pay | Admitting: Family Medicine

## 2014-08-23 DIAGNOSIS — Z452 Encounter for adjustment and management of vascular access device: Secondary | ICD-10-CM | POA: Diagnosis not present

## 2014-08-23 DIAGNOSIS — B999 Unspecified infectious disease: Secondary | ICD-10-CM

## 2014-08-23 MED ORDER — HEPARIN SOD (PORK) LOCK FLUSH 100 UNIT/ML IV SOLN
INTRAVENOUS | Status: AC
  Filled 2014-08-23: qty 5

## 2014-08-23 MED ORDER — LIDOCAINE HCL 1 % IJ SOLN
INTRAMUSCULAR | Status: AC
  Filled 2014-08-23: qty 20

## 2014-08-23 NOTE — Procedures (Signed)
Successful placement of right basilic vein approach single lumen PICC line with tip at the superior caval-atrial junction.  The PICC line is ready for immediate use. 

## 2014-08-24 ENCOUNTER — Non-Acute Institutional Stay (INDEPENDENT_AMBULATORY_CARE_PROVIDER_SITE_OTHER): Payer: Medicare Other | Admitting: Family Medicine

## 2014-08-24 DIAGNOSIS — R1312 Dysphagia, oropharyngeal phase: Secondary | ICD-10-CM

## 2014-08-24 NOTE — Progress Notes (Signed)
Opened in error

## 2014-08-26 ENCOUNTER — Other Ambulatory Visit: Payer: Self-pay | Admitting: Family Medicine

## 2014-08-27 ENCOUNTER — Telehealth: Payer: Self-pay | Admitting: Family Medicine

## 2014-08-27 NOTE — Telephone Encounter (Signed)
Family Medicine After hours phone call  Phone call from [facility]. Pt continues to pull out lines, pulled on his foley catheter and ruptured the balloon. Pt not complaining of pain, no bleeding. Recommended to nurse to replace foley, or use condom cath, and do their best at keeping him from disrupting these lines. If needed can keep cath out and check bladder volume, if significantly elevated/pt retaining urine can do in and out cath. Note routed to geri team.  Tawni CarnesAndrew Benen Weida, MD 08/27/2014, 1:20 PM PGY-2, [facility]

## 2014-08-29 ENCOUNTER — Encounter: Payer: Self-pay | Admitting: Pharmacist

## 2014-08-29 ENCOUNTER — Other Ambulatory Visit: Payer: Self-pay | Admitting: Family Medicine

## 2014-08-29 DIAGNOSIS — B999 Unspecified infectious disease: Secondary | ICD-10-CM

## 2014-08-30 ENCOUNTER — Other Ambulatory Visit: Payer: Self-pay | Admitting: Family Medicine

## 2014-08-30 DIAGNOSIS — Z9889 Other specified postprocedural states: Secondary | ICD-10-CM

## 2014-08-30 MED ORDER — OXYCODONE HCL 5 MG PO TABS: 2.5000 mg | ORAL_TABLET | ORAL | Status: DC | PRN

## 2014-08-30 MED ORDER — OXYCODONE HCL 5 MG PO TABS: 5.0000 mg | ORAL_TABLET | Freq: Four times a day (QID) | ORAL | Status: DC

## 2014-08-31 ENCOUNTER — Other Ambulatory Visit: Payer: Self-pay | Admitting: Family Medicine

## 2014-08-31 ENCOUNTER — Ambulatory Visit (HOSPITAL_COMMUNITY)
Admission: RE | Admit: 2014-08-31 | Discharge: 2014-08-31 | Disposition: A | Discharge status: Home / Self Care | Payer: Medicare Other | Source: Ambulatory Visit | Attending: Family Medicine | Admitting: Family Medicine

## 2014-08-31 DIAGNOSIS — N3289 Other specified disorders of bladder: Secondary | ICD-10-CM | POA: Diagnosis not present

## 2014-08-31 DIAGNOSIS — B999 Unspecified infectious disease: Secondary | ICD-10-CM

## 2014-08-31 DIAGNOSIS — Z452 Encounter for adjustment and management of vascular access device: Secondary | ICD-10-CM | POA: Insufficient documentation

## 2014-08-31 DIAGNOSIS — N390 Urinary tract infection, site not specified: Secondary | ICD-10-CM | POA: Diagnosis not present

## 2014-08-31 MED ORDER — LIDOCAINE HCL 1 % IJ SOLN
INTRAMUSCULAR | Status: AC
  Filled 2014-08-31: qty 20

## 2014-08-31 NOTE — Procedures (Signed)
Successful placement of right lumen PICC line to basilic vein. Length 40 cm Tip at lower SVC/RA No complications Ready for use.  Brayton ElKevin Jeryn Cerney PA-C Interventional Radiology 08/31/2014 8:44 AM

## 2014-09-01 ENCOUNTER — Encounter (HOSPITAL_COMMUNITY): Payer: Self-pay | Admitting: Nurse Practitioner

## 2014-09-01 ENCOUNTER — Inpatient Hospital Stay (HOSPITAL_COMMUNITY)
Admission: EM | Admit: 2014-09-01 | Discharge: 2014-09-02 | DRG: 699 | Disposition: A | Discharge status: Skilled Nursing Facility | Payer: Medicare Other | Attending: Family Medicine | Admitting: Family Medicine

## 2014-09-01 DIAGNOSIS — F329 Major depressive disorder, single episode, unspecified: Secondary | ICD-10-CM | POA: Diagnosis present

## 2014-09-01 DIAGNOSIS — E1142 Type 2 diabetes mellitus with diabetic polyneuropathy: Secondary | ICD-10-CM | POA: Diagnosis present

## 2014-09-01 DIAGNOSIS — N319 Neuromuscular dysfunction of bladder, unspecified: Secondary | ICD-10-CM | POA: Diagnosis present

## 2014-09-01 DIAGNOSIS — S3739XA Other injury of urethra, initial encounter: Secondary | ICD-10-CM | POA: Diagnosis present

## 2014-09-01 DIAGNOSIS — E785 Hyperlipidemia, unspecified: Secondary | ICD-10-CM | POA: Diagnosis present

## 2014-09-01 DIAGNOSIS — M1990 Unspecified osteoarthritis, unspecified site: Secondary | ICD-10-CM | POA: Diagnosis present

## 2014-09-01 DIAGNOSIS — N390 Urinary tract infection, site not specified: Secondary | ICD-10-CM | POA: Diagnosis present

## 2014-09-01 DIAGNOSIS — G8220 Paraplegia, unspecified: Secondary | ICD-10-CM | POA: Diagnosis present

## 2014-09-01 DIAGNOSIS — Z8249 Family history of ischemic heart disease and other diseases of the circulatory system: Secondary | ICD-10-CM

## 2014-09-01 DIAGNOSIS — N3289 Other specified disorders of bladder: Principal | ICD-10-CM | POA: Diagnosis present

## 2014-09-01 DIAGNOSIS — R310 Gross hematuria: Secondary | ICD-10-CM | POA: Diagnosis present

## 2014-09-01 DIAGNOSIS — I10 Essential (primary) hypertension: Secondary | ICD-10-CM | POA: Diagnosis present

## 2014-09-01 DIAGNOSIS — R319 Hematuria, unspecified: Secondary | ICD-10-CM | POA: Diagnosis present

## 2014-09-01 DIAGNOSIS — Y846 Urinary catheterization as the cause of abnormal reaction of the patient, or of later complication, without mention of misadventure at the time of the procedure: Secondary | ICD-10-CM | POA: Diagnosis present

## 2014-09-01 DIAGNOSIS — I5022 Chronic systolic (congestive) heart failure: Secondary | ICD-10-CM | POA: Diagnosis present

## 2014-09-01 DIAGNOSIS — I739 Peripheral vascular disease, unspecified: Secondary | ICD-10-CM | POA: Diagnosis present

## 2014-09-01 DIAGNOSIS — Z87891 Personal history of nicotine dependence: Secondary | ICD-10-CM

## 2014-09-01 DIAGNOSIS — Z833 Family history of diabetes mellitus: Secondary | ICD-10-CM

## 2014-09-01 DIAGNOSIS — I351 Nonrheumatic aortic (valve) insufficiency: Secondary | ICD-10-CM | POA: Diagnosis present

## 2014-09-01 NOTE — ED Notes (Signed)
Bed: WA20 Expected date: 09/01/14 Expected time: 11:11 PM Means of arrival: Ambulance Comments: 81 yo M  Foley not draining, hematuria

## 2014-09-01 NOTE — ED Notes (Signed)
Pt presents [facility], had a foley cath put yesterday, hx of BPH, facility noted blood clots in the drainage bag with no urine output. Pt denies pain, currently on abx through his PICC line. AOx4.

## 2014-09-02 ENCOUNTER — Inpatient Hospital Stay (HOSPITAL_COMMUNITY): Payer: Medicare Other | Admitting: Certified Registered"

## 2014-09-02 ENCOUNTER — Encounter (HOSPITAL_COMMUNITY)
Admission: EM | Disposition: A | Discharge status: Skilled Nursing Facility | Payer: Self-pay | Source: Home / Self Care | Attending: Family Medicine

## 2014-09-02 ENCOUNTER — Ambulatory Visit: Admit: 2014-09-02 | Payer: Self-pay | Admitting: Urology

## 2014-09-02 ENCOUNTER — Encounter (HOSPITAL_COMMUNITY): Payer: Self-pay | Admitting: Emergency Medicine

## 2014-09-02 DIAGNOSIS — I351 Nonrheumatic aortic (valve) insufficiency: Secondary | ICD-10-CM | POA: Diagnosis present

## 2014-09-02 DIAGNOSIS — E1142 Type 2 diabetes mellitus with diabetic polyneuropathy: Secondary | ICD-10-CM | POA: Diagnosis present

## 2014-09-02 DIAGNOSIS — N319 Neuromuscular dysfunction of bladder, unspecified: Secondary | ICD-10-CM | POA: Diagnosis present

## 2014-09-02 DIAGNOSIS — G8220 Paraplegia, unspecified: Secondary | ICD-10-CM | POA: Diagnosis present

## 2014-09-02 DIAGNOSIS — E785 Hyperlipidemia, unspecified: Secondary | ICD-10-CM | POA: Diagnosis present

## 2014-09-02 DIAGNOSIS — S3739XA Other injury of urethra, initial encounter: Secondary | ICD-10-CM | POA: Diagnosis present

## 2014-09-02 DIAGNOSIS — N3289 Other specified disorders of bladder: Secondary | ICD-10-CM | POA: Diagnosis present

## 2014-09-02 DIAGNOSIS — I10 Essential (primary) hypertension: Secondary | ICD-10-CM | POA: Diagnosis present

## 2014-09-02 DIAGNOSIS — N390 Urinary tract infection, site not specified: Secondary | ICD-10-CM | POA: Diagnosis present

## 2014-09-02 DIAGNOSIS — Z833 Family history of diabetes mellitus: Secondary | ICD-10-CM | POA: Diagnosis not present

## 2014-09-02 DIAGNOSIS — M1990 Unspecified osteoarthritis, unspecified site: Secondary | ICD-10-CM | POA: Diagnosis present

## 2014-09-02 DIAGNOSIS — I5022 Chronic systolic (congestive) heart failure: Secondary | ICD-10-CM | POA: Diagnosis present

## 2014-09-02 DIAGNOSIS — Y846 Urinary catheterization as the cause of abnormal reaction of the patient, or of later complication, without mention of misadventure at the time of the procedure: Secondary | ICD-10-CM | POA: Diagnosis present

## 2014-09-02 DIAGNOSIS — F329 Major depressive disorder, single episode, unspecified: Secondary | ICD-10-CM | POA: Diagnosis present

## 2014-09-02 DIAGNOSIS — I739 Peripheral vascular disease, unspecified: Secondary | ICD-10-CM | POA: Diagnosis present

## 2014-09-02 DIAGNOSIS — Z8249 Family history of ischemic heart disease and other diseases of the circulatory system: Secondary | ICD-10-CM | POA: Diagnosis not present

## 2014-09-02 DIAGNOSIS — Z87891 Personal history of nicotine dependence: Secondary | ICD-10-CM | POA: Diagnosis not present

## 2014-09-02 DIAGNOSIS — R310 Gross hematuria: Secondary | ICD-10-CM | POA: Diagnosis present

## 2014-09-02 DIAGNOSIS — R319 Hematuria, unspecified: Secondary | ICD-10-CM | POA: Diagnosis present

## 2014-09-02 HISTORY — PX: CYSTO: SHX6284

## 2014-09-02 LAB — CBC WITH DIFFERENTIAL/PLATELET
BASOS ABS: 0 10*3/uL (ref 0.0–0.1)
Basophils Relative: 0 % (ref 0–1)
Eosinophils Absolute: 0.2 10*3/uL (ref 0.0–0.7)
Eosinophils Relative: 2 % (ref 0–5)
HEMATOCRIT: 31.3 % — AB (ref 39.0–52.0)
Hemoglobin: 9.8 g/dL — ABNORMAL LOW (ref 13.0–17.0)
LYMPHS ABS: 1.1 10*3/uL (ref 0.7–4.0)
LYMPHS PCT: 12 % (ref 12–46)
MCH: 26.9 pg (ref 26.0–34.0)
MCHC: 31.3 g/dL (ref 30.0–36.0)
MCV: 86 fL (ref 78.0–100.0)
MONO ABS: 0.7 10*3/uL (ref 0.1–1.0)
Monocytes Relative: 8 % (ref 3–12)
Neutro Abs: 7 10*3/uL (ref 1.7–7.7)
Neutrophils Relative %: 78 % — ABNORMAL HIGH (ref 43–77)
Platelets: 290 10*3/uL (ref 150–400)
RBC: 3.64 MIL/uL — AB (ref 4.22–5.81)
RDW: 16.8 % — AB (ref 11.5–15.5)
WBC: 9.1 10*3/uL (ref 4.0–10.5)

## 2014-09-02 LAB — URINALYSIS, ROUTINE W REFLEX MICROSCOPIC
BILIRUBIN URINE: NEGATIVE
GLUCOSE, UA: 100 mg/dL — AB
KETONES UR: NEGATIVE mg/dL
NITRITE: NEGATIVE
PH: 6 (ref 5.0–8.0)
Protein, ur: 30 mg/dL — AB
Specific Gravity, Urine: 1.01 (ref 1.005–1.030)
UROBILINOGEN UA: 0.2 mg/dL (ref 0.0–1.0)

## 2014-09-02 LAB — GLUCOSE, CAPILLARY
Glucose-Capillary: 183 mg/dL — ABNORMAL HIGH (ref 70–99)
Glucose-Capillary: 169 mg/dL — ABNORMAL HIGH (ref 70–99)

## 2014-09-02 LAB — I-STAT CHEM 8, ED
BUN: 18 mg/dL (ref 6–23)
CALCIUM ION: 1.2 mmol/L (ref 1.13–1.30)
CHLORIDE: 103 mmol/L (ref 96–112)
Creatinine, Ser: 1 mg/dL (ref 0.50–1.35)
Glucose, Bld: 256 mg/dL — ABNORMAL HIGH (ref 70–99)
HEMATOCRIT: 31 % — AB (ref 39.0–52.0)
Hemoglobin: 10.5 g/dL — ABNORMAL LOW (ref 13.0–17.0)
POTASSIUM: 3.9 mmol/L (ref 3.5–5.1)
Sodium: 143 mmol/L (ref 135–145)
TCO2: 23 mmol/L (ref 0–100)

## 2014-09-02 LAB — URINE MICROSCOPIC-ADD ON

## 2014-09-02 SURGERY — CYSTO
Anesthesia: General

## 2014-09-02 MED ORDER — FENTANYL CITRATE 0.05 MG/ML IJ SOLN
INTRAMUSCULAR | Status: DC | PRN
Start: 2014-09-02 — End: 2014-09-02
  Administered 2014-09-02 (×2): 25 ug via INTRAVENOUS

## 2014-09-02 MED ORDER — DEXTROSE 5 % IV SOLN
1.0000 g | Freq: Once | INTRAVENOUS | Status: AC
  Administered 2014-09-02: 1 g via INTRAVENOUS
  Filled 2014-09-02: qty 10

## 2014-09-02 MED ORDER — METOPROLOL TARTRATE 1 MG/ML IV SOLN
2.5000 mg | Freq: Once | INTRAVENOUS | Status: AC
  Administered 2014-09-02: 2.5 mg via INTRAVENOUS

## 2014-09-02 MED ORDER — EPHEDRINE SULFATE 50 MG/ML IJ SOLN
INTRAMUSCULAR | Status: AC
  Filled 2014-09-02: qty 1

## 2014-09-02 MED ORDER — SODIUM CHLORIDE 0.9 % IV SOLN
INTRAVENOUS | Status: DC | PRN
  Administered 2014-09-02: 06:00:00 via INTRAVENOUS

## 2014-09-02 MED ORDER — METOPROLOL TARTRATE 1 MG/ML IV SOLN
INTRAVENOUS | Status: AC
  Filled 2014-09-02: qty 5

## 2014-09-02 MED ORDER — FENTANYL CITRATE 0.05 MG/ML IJ SOLN
INTRAMUSCULAR | Status: AC
  Filled 2014-09-02: qty 2

## 2014-09-02 MED ORDER — ATROPINE SULFATE 0.4 MG/ML IJ SOLN
INTRAMUSCULAR | Status: AC
  Filled 2014-09-02: qty 2

## 2014-09-02 MED ORDER — PROPOFOL 10 MG/ML IV BOLUS
INTRAVENOUS | Status: AC
  Filled 2014-09-02: qty 20

## 2014-09-02 MED ORDER — PROPOFOL 10 MG/ML IV BOLUS
INTRAVENOUS | Status: DC | PRN
  Administered 2014-09-02: 100 mg via INTRAVENOUS

## 2014-09-02 MED ORDER — PHENYLEPHRINE HCL 10 MG/ML IJ SOLN
INTRAMUSCULAR | Status: DC | PRN
  Administered 2014-09-02 (×4): 80 ug via INTRAVENOUS

## 2014-09-02 MED ORDER — FENTANYL CITRATE 0.05 MG/ML IJ SOLN: 25.0000 ug | INTRAMUSCULAR | Status: DC | PRN

## 2014-09-02 MED ORDER — SODIUM CHLORIDE 0.9 % IJ SOLN
INTRAMUSCULAR | Status: AC
  Filled 2014-09-02: qty 10

## 2014-09-02 MED ORDER — PHENYLEPHRINE 40 MCG/ML (10ML) SYRINGE FOR IV PUSH (FOR BLOOD PRESSURE SUPPORT)
PREFILLED_SYRINGE | INTRAVENOUS | Status: AC
  Filled 2014-09-02: qty 10

## 2014-09-02 MED ORDER — SODIUM CHLORIDE 0.9 % IR SOLN
Status: DC | PRN
  Administered 2014-09-02: 6000 mL

## 2014-09-02 MED ORDER — LIDOCAINE HCL (CARDIAC) 20 MG/ML IV SOLN
INTRAVENOUS | Status: DC | PRN
  Administered 2014-09-02: 80 mg via INTRAVENOUS

## 2014-09-02 MED ORDER — LIDOCAINE HCL (CARDIAC) 20 MG/ML IV SOLN
INTRAVENOUS | Status: AC
  Filled 2014-09-02: qty 5

## 2014-09-02 MED ORDER — ONDANSETRON HCL 4 MG/2ML IJ SOLN
INTRAMUSCULAR | Status: DC | PRN
  Administered 2014-09-02: 4 mg via INTRAVENOUS

## 2014-09-02 MED ORDER — 0.9 % SODIUM CHLORIDE (POUR BTL) OPTIME
TOPICAL | Status: DC | PRN
  Administered 2014-09-02: 1000 mL

## 2014-09-02 MED ORDER — ONDANSETRON HCL 4 MG/2ML IJ SOLN
INTRAMUSCULAR | Status: AC
  Filled 2014-09-02: qty 2

## 2014-09-02 MED ORDER — LIDOCAINE HCL 2 % EX GEL
1.0000 "application " | Freq: Once | CUTANEOUS | Status: AC
  Administered 2014-09-02: 1 via URETHRAL
  Filled 2014-09-02: qty 10

## 2014-09-02 SURGICAL SUPPLY — 9 items
BAG URO CATCHER STRL LF (DRAPE) ×2 IMPLANT
GLOVE BIOGEL M STRL SZ7.5 (GLOVE) ×2 IMPLANT
GOWN STRL REUS W/TWL LRG LVL3 (GOWN DISPOSABLE) ×2 IMPLANT
GUIDEWIRE STR DUAL SENSOR (WIRE) ×2 IMPLANT
MANIFOLD NEPTUNE II (INSTRUMENTS) ×2 IMPLANT
PACK CYSTO (CUSTOM PROCEDURE TRAY) ×2 IMPLANT
SYRINGE IRR TOOMEY STRL 70CC (SYRINGE) ×2 IMPLANT
TUBING CONNECTING 10 (TUBING) ×1 IMPLANT
TUBING CONNECTING 10' (TUBING) ×1

## 2014-09-02 NOTE — Progress Notes (Signed)
Dr. Glade Stanford. Fitzgerald made aware of patient's CBG results in PACU and that patient is going back to  Nursing Facility

## 2014-09-02 NOTE — Consult Note (Signed)
Reason for Consult: Hematuria, Neurogenic Bladder  Referring Physician: April Palujmbo MD  Carlos Andrews is an 81 y.o. male.   HPI:   1 - Hematuria / Foley Trauma - new gross hematuria and non-working cathter noted at SNF today. Had catheter since last admission for L-spine osteomyelitis. Pt cannot confirm or deny recent catheteter trauma. Blood at meatus. Attempt at replacement in ER unable to confirm correct position. Cr 1.0. Denies blood thinner usage other than dailys  ASA.   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 No formal urodynamic testing. Has only partial use of lower extremeties.  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 Lives in SNF at baseline. HIs PCP is Dr. Sherron Andrews with family medicine.  Today "Carlos Andrews" is seen as urgent consult for above. Denies fevers.   Past Medical History  Diagnosis Date  . DIABETES MELLITUS, II, COMPLICATIONS 08/07/2006  . Esophageal perforation 1993    managed at [facility]  . PPD positive 1993    Found at [facility]  . Spinal stenosis of lumbar region at multiple levels   . Proteinuria 08/07/2006    Qualifier: Diagnosis of  By: Haydee Salter    . Open angle with borderline findings and low glaucoma risk in both eyes 02/23/2013  . Regular astigmatism 02/23/2013  . Presbyopia 02/23/2013  . Ptosis of left eyelid 02/23/2013  . At risk for falling 10/04/2013  . GAIT DISTURBANCE 08/01/2008        . HYPERLIPIDEMIA 08/07/2006    Qualifier: Diagnosis of  By: Haydee Salter    . HYPERTENSION, BENIGN SYSTEMIC 08/07/2006    Qualifier: Diagnosis of  By: Haydee Salter    . IMPOTENCE, ORGANIC 08/07/2006    Qualifier: Diagnosis of  By: Haydee Salter    . LUMBAR SPRAIN AND STRAIN 07/19/2008    Qualifier: Diagnosis of  By: Mauricio Po MD, Fayrene Fearing    . Myopia with presbyopia of left eye 02/23/2013  . OBESITY, NOS 08/07/2006    Qualifier: Diagnosis of  By: Haydee Salter     . OSTEOARTHRITIS, LOWER LEG 08/07/2006    Qualifier: Diagnosis of  By: Haydee Salter    . PERIPHERAL EDEMA 08/01/2008    Qualifier: Diagnosis of  By: Sheffield Slider MD, Deniece Portela    . Peripheral vascular disease, unspecified 04/21/2012    Severe tibial occlusive disease (Dr Hart Rochester, Vasc Surgery, 04/2002)     . Recurrent falls 10/04/2013  . Skin ulcer of foot, left heel 02/23/2013  . Type II or unspecified type diabetes mellitus without mention of complication, not stated as uncontrolled 08/07/2006    Qualifier: Diagnosis of  By: Haydee Salter    . Unspecified hereditary and idiopathic peripheral neuropathy 08/07/2006        . Proteinuria 08/07/2006  . Impairment of balance 05/10/2014  . Urge incontinence 05/10/2014  . Impaired mobility and ADLs 05/10/2014  . Diabetes mellitus due to underlying condition with diabetic polyneuropathy 08/07/2006    Qualifier: Diagnosis of  By: Haydee Salter    . Difficulty in voiding 08/22/2014  . 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 Chronic systolic heart failure 08/22/2014  . Mild to Moderate aortic regurgitation 08/22/2014  . Heart failure with reduced ejection fraction 08/22/2014  . Discitis of lumbar region 08/22/2014    Hyperintense signal on Lumbar MRI 08/12/14 in L2-L3 disc.    Past Surgical History  Procedure Laterality Date  . Neck surgery  1993    s/p C1-2, C2-3 anterior cervical diskectomies,  partial corpectomies through thoracotomy at [facility] 1993  . Cataract extraction      Cataract surgery   . Thoracotomy  1993    s/p T3-T4 osteomyelitis with multiple open drainages including thoracotomy at [facility] 1993  . Spinal cord decompression  1993    s/p T1-T2 spinal cord compression secondary to granualtion tissue and abscess material secondary to previous mediastinitis at [facility] 1993  . Jejunostomy feeding tube  1993    s/p Feeding jejunostomy [facility] 1993  . Esophageal perforation  1993    Hx of esophageal perforation complication 1993  . Lumbar epidural injection  2010     Epidural corticosteroid injection for L2-3 & L4-5 severe spinal stenosis at [facility] on 9/10, 10/10, 11/10    . Lumbar laminectomy/decompression microdiscectomy N/A 08/13/2014    Procedure: LUMBAR LAMINECTOMY ;  Surgeon: Shirlean Kelly, MD;  Location: [facility] NEURO ORS;  Service: Neurosurgery;  Laterality: N/A;    Family History  Problem Relation Age of Onset  . Diabetes Mother   . Diabetes Father   . Hypertension Father   . Other Father     amputaion  . Diabetes Son   . Hypertension Son     Social History:  reports that he quit smoking about 27 years ago. His smoking use included Cigarettes. He has never used smokeless tobacco. He reports that he does not drink alcohol or use illicit drugs.  Allergies:  Allergies  Allergen Reactions  . Naprosyn [Naproxen] Other (See Comments)    GI upset  . Rofecoxib     REACTION: dizzy  . Tramadol Hcl     REACTION: vomiting    Medications: I have reviewed the patient's current medications.  Results for orders placed or performed during the hospital encounter of 09/01/14 (from the past 48 hour(s))  CBC with Differential/Platelet     Status: Abnormal   Collection Time: 09/02/14 12:29 AM  Result Value Ref Range   WBC 9.1 4.0 - 10.5 K/uL   RBC 3.64 (L) 4.22 - 5.81 MIL/uL   Hemoglobin 9.8 (L) 13.0 - 17.0 g/dL   HCT 16.1 (L) 09.6 - 04.5 %   MCV 86.0 78.0 - 100.0 fL   MCH 26.9 26.0 - 34.0 pg   MCHC 31.3 30.0 - 36.0 g/dL   RDW 40.9 (H) 81.1 - 91.4 %   Platelets 290 150 - 400 K/uL   Neutrophils Relative % 78 (H) 43 - 77 %   Neutro Abs 7.0 1.7 - 7.7 K/uL   Lymphocytes Relative 12 12 - 46 %   Lymphs Abs 1.1 0.7 - 4.0 K/uL   Monocytes Relative 8 3 - 12 %   Monocytes Absolute 0.7 0.1 - 1.0 K/uL   Eosinophils Relative 2 0 - 5 %   Eosinophils Absolute 0.2 0.0 - 0.7 K/uL   Basophils Relative 0 0 - 1 %   Basophils Absolute 0.0 0.0 - 0.1 K/uL  I-stat chem 8, ed     Status: Abnormal   Collection Time: 09/02/14 12:43 AM  Result Value Ref  Range   Sodium 143 135 - 145 mmol/L   Potassium 3.9 3.5 - 5.1 mmol/L   Chloride 103 96 - 112 mmol/L   BUN 18 6 - 23 mg/dL   Creatinine, Ser 7.82 0.50 - 1.35 mg/dL   Glucose, Bld 956 (H) 70 - 99 mg/dL   Calcium, Ion 2.13 0.86 - 1.30 mmol/L   TCO2 23 0 - 100 mmol/L   Hemoglobin 10.5 (L) 13.0 - 17.0 g/dL  HCT 31.0 (L) 39.0 - 52.0 %  Urinalysis, Routine w reflex microscopic     Status: Abnormal   Collection Time: 09/02/14  3:22 AM  Result Value Ref Range   Color, Urine RED (A) YELLOW    Comment: BIOCHEMICALS MAY BE AFFECTED BY COLOR   APPearance TURBID (A) CLEAR   Specific Gravity, Urine 1.010 1.005 - 1.030   pH 6.0 5.0 - 8.0   Glucose, UA 100 (A) NEGATIVE mg/dL   Hgb urine dipstick LARGE (A) NEGATIVE   Bilirubin Urine NEGATIVE NEGATIVE   Ketones, ur NEGATIVE NEGATIVE mg/dL   Protein, ur 30 (A) NEGATIVE mg/dL   Urobilinogen, UA 0.2 0.0 - 1.0 mg/dL   Nitrite NEGATIVE NEGATIVE   Leukocytes, UA MODERATE (A) NEGATIVE  Urine microscopic-add on     Status: None   Collection Time: 09/02/14  3:22 AM  Result Value Ref Range   RBC / HPF TOO NUMEROUS TO COUNT <3 RBC/hpf   Urine-Other FIELD OBSCURED BY RBC'S     Ir Fluoro Guide Cv Line Right  08/31/2014   CLINICAL DATA:  Poor venous access.  Request PICC line placement.  EXAM: IR RIGHT FLUORO GUIDE CV LINE; IR ULTRASOUND GUIDANCE VASC ACCESS RIGHT  FLUOROSCOPY TIME:  6 seconds.  TECHNIQUE: The right arm was prepped with chlorhexidine, draped in the usual sterile fashion using maximum barrier technique (cap and mask, sterile gown, sterile gloves, large sterile sheet, hand hygiene and cutaneous antiseptic). Local anesthesia was attained by infiltration with 1% lidocaine.  Ultrasound demonstrated patency of the basilic vein, and this was documented with an image. Under real-time ultrasound guidance, this vein was accessed with a 21 gauge micropuncture needle and image documentation was performed. The needle was exchanged over a guidewire for a  peel-away sheath through which a 40 cm 5 [place] single lumen power injectable PICC was advanced, and positioned with its tip at the lower SVC/right atrial junction. Fluoroscopy during the procedure and fluoro spot radiograph confirms appropriate catheter position. The catheter was flushed, secured to the skin with Prolene sutures, and covered with a sterile dressing.  COMPLICATIONS: None.  The patient tolerated the procedure well.  IMPRESSION: Successful placement of a right arm PICC with sonographic and fluoroscopic guidance. The catheter is ready for use.  Read by Brayton El PA-C   Electronically Signed   By: Richarda Overlie M.D.   On: 08/31/2014 08:47   Ir US Guide Vasc Access Right  08/31/2014   CLINICAL DATA:  Poor venous access.  Request PICC line placement.  EXAM: IR RIGHT FLUORO GUIDE CV LINE; IR ULTRASOUND GUIDANCE VASC ACCESS RIGHT  FLUOROSCOPY TIME:  6 seconds.  TECHNIQUE: The right arm was prepped with chlorhexidine, draped in the usual sterile fashion using maximum barrier technique (cap and mask, sterile gown, sterile gloves, large sterile sheet, hand hygiene and cutaneous antiseptic). Local anesthesia was attained by infiltration with 1% lidocaine.  Ultrasound demonstrated patency of the basilic vein, and this was documented with an image. Under real-time ultrasound guidance, this vein was accessed with a 21 gauge micropuncture needle and image documentation was performed. The needle was exchanged over a guidewire for a peel-away sheath through which a 40 cm 5 [place] single lumen power injectable PICC was advanced, and positioned with its tip at the lower SVC/right atrial junction. Fluoroscopy during the procedure and fluoro spot radiograph confirms appropriate catheter position. The catheter was flushed, secured to the skin with Prolene sutures, and covered with a sterile dressing.  COMPLICATIONS: None.  The  patient tolerated the procedure well.  IMPRESSION: Successful placement of a right arm PICC  with sonographic and fluoroscopic guidance. The catheter is ready for use.  Read by Brayton ElKevin Bruning PA-C   Electronically Signed   By: Richarda OverlieAdam  Henn M.D.   On: 08/31/2014 08:47    Review of Systems  Constitutional: Negative.   HENT: Negative.   Eyes: Negative.   Respiratory: Negative.   Cardiovascular: Negative.   Gastrointestinal: Negative.   Genitourinary: Positive for hematuria.  Musculoskeletal: Negative.   Skin: Negative.   Neurological: Positive for focal weakness.  Endo/Heme/Allergies: Negative.   Psychiatric/Behavioral: Positive for depression and memory loss.   Blood pressure 134/75, pulse 101, temperature 99.5 F (37.5 C), temperature source Oral, resp. rate 22, SpO2 100 %. Physical Exam  Constitutional: He appears well-developed.  Disheveled, stigmata of chronic depression and neurologic disase  HENT:  Head: Normocephalic.  Neck: Normal range of motion.  Cardiovascular: Normal rate.   Respiratory: Effort normal.  GI:  Large ventral hernias  Genitourinary:  3 way catheter with blood at meatus, does not irrigate with repositioning.   Musculoskeletal: Normal range of motion.  Neurological: He is alert.  AOx1, very tangential answers only very basic questions  Skin: Skin is warm.    Assessment/Plan:  1 - Hematuria / Foley Trauma - suspect foley trauma and urethral false pass. Pt unable to tollerate bedside cysto well. Rec OR cysto, clot evac, fulgeration, catheter replacement urgently. Risks including bleeding, infection, need for tubes / catheters, non-cure, as well as rare risks such as DVT, PE, MI, CVA, mortality discussed.  2 - Neurogenic Bladder - will likely need formal bladder testing v. Continue chronic foley at some point. Do not favor SPT placement today as has large ventral hernia and pt unsure of surgical history and no recent axial imaging.     Janice Bodine 09/02/2014, 5:21 AM

## 2014-09-02 NOTE — Progress Notes (Signed)
Metoprolol 2.5 mg IVP given as ordered. 

## 2014-09-02 NOTE — ED Notes (Signed)
Spoke with nursing home staff and they stated that pt pulled his urinary catheter out 2 days ago  Catheter was replaced at the nursing home and since then pt has had blood in his urine and catheter has not been draining correctly

## 2014-09-02 NOTE — Progress Notes (Signed)
S:  Pt s/p cysto with clot evacuation and catheter replacement for pendulous urethral injury. No active bleeding and new catheter working well.  O: NAD, at baseline RRR SNDNT, ventral hernias Foley c/d/i with yellow uirn  A/P 1 - I feel pt stable for return to nursing home, current catheter to remain in place. He will need GU follow up for this and for his likely neurogenic bladder. Pt's wife called and updated as was his nursing facility [facility].

## 2014-09-02 NOTE — Anesthesia Postprocedure Evaluation (Signed)
  Anesthesia Post-op Note  Patient: Carlos SentersHerman R Bergemann  Procedure(s) Performed: Procedure(s): CYSTO, clot evacuation (N/A)  Patient Location: PACU  Anesthesia Type:General  Level of Consciousness: awake and alert   Airway and Oxygen Therapy: Patient Spontanous Breathing  Post-op Pain: none  Post-op Assessment: Post-op Vital signs reviewed  Post-op Vital Signs: Reviewed  Last Vitals:  Filed Vitals:   09/02/14 0940  BP: 115/46  Pulse: 94  Temp: 37.1 C  Resp: 20    Complications: No apparent anesthesia complications

## 2014-09-02 NOTE — Progress Notes (Signed)
Dr. Berneice HeinrichManny made aware of patient's temperature of 99.46F. ORALLY

## 2014-09-02 NOTE — ED Notes (Signed)
OR notified patient is ready. States she will come retrieve patient.

## 2014-09-02 NOTE — Anesthesia Procedure Notes (Signed)
Procedure Name: LMA Insertion Date/Time: 09/02/2014 6:46 AM Performed by: Jarvis NewcomerARMISTEAD, Emmajane Altamura A Pre-anesthesia Checklist: Emergency Drugs available, Patient identified, Patient being monitored, Timeout performed and Suction available Patient Re-evaluated:Patient Re-evaluated prior to inductionOxygen Delivery Method: Circle system utilized Preoxygenation: Pre-oxygenation with 100% oxygen Intubation Type: IV induction LMA: LMA inserted LMA Size: 4.0 Number of attempts: 1 Placement Confirmation: positive ETCO2 and breath sounds checked- equal and bilateral Tube secured with: Tape Dental Injury: Teeth and Oropharynx as per pre-operative assessment

## 2014-09-02 NOTE — Progress Notes (Signed)
Dr. Glade Stanford. Fitzgerald notified of patient's heart rates being 94-95- O.K. To go to Short Stay

## 2014-09-02 NOTE — ED Notes (Signed)
Surgical consent obtained via telephone from patients son Johnn Hai(Dhairya Rhem Jr). Second verified by Marinda ElkLisa Adkins, RN

## 2014-09-02 NOTE — Progress Notes (Signed)
Dr. York Spaniel. Fizgerald  Made aware of patient's heart rates and blood pressure-orders given

## 2014-09-02 NOTE — Discharge Instructions (Signed)
1 - You may have urinary urgency (bladder spasms) and bloody urine on / off with catheter in place. This is normal. ° °2 - Call MD or go to ER for fever >102, severe pain / nausea / vomiting not relieved by medications, or acute change in medical status ° °

## 2014-09-02 NOTE — ED Notes (Signed)
Mistakenly clicked off 20 urine.

## 2014-09-02 NOTE — Progress Notes (Signed)
Dr. Berneice HeinrichManny made aware of patient's temperature in Short Stay- 100.28F orally- O.K. For patient to go back to Nursing Facility per Dr. Berneice HeinrichManny

## 2014-09-02 NOTE — Op Note (Signed)
NAMETAAHIR, GRISBY                ACCOUNT NO.:  [id number]  MEDICAL RECORD NO.:  [id number]  LOCATION:                                 FACILITY:  PHYSICIAN:  Sebastian Ache, MD     DATE OF BIRTH:  1933-05-09  DATE OF PROCEDURE:  09/01/2014                              OPERATIVE REPORT   PREOPERATIVE DIAGNOSIS:  Gross hematuria with clot retention and urethral trauma.  POSTOPERATIVE DIAGNOSIS:  Gross hematuria with clot retention and urethral trauma.  PROCEDURE: 1. Cystoscopy with evacuation of clots. 2. Catheter placement complicated.  ESTIMATED BLOOD LOSS:  Approximately 100 mL of old blood, no new blood.  FINDINGS: 1. Evidence of urethral trauma with approximately 30% to 50% proximal     pendulous urethra disruption likely prior catheter trauma. 2. Approximately 100 mL of formed clot in the urinary bladder.  This     was irrigated, otherwise, unremarkable urethra and bladder. 3. Placement of 24-French Councill catheter.  A 10 mL of water     in the balloon.  INDICATIONS:  Mr. Witucki is an unfortunate 81 year old gentleman with a history of severe depression and osteomyelitis of the spine with partial paraplegia.  He is most recently a resident of a skilled nursing facility.  He has had a catheter and since his most recent admission likely for partial neurogenic bladder in the setting of his L-spine disease.  He was noted to have new significant gross hematuria and malfunction of the catheter at a skilled nursing facility and was transferred to the ER for further evaluation.  A single upper placement attempt was performed there by ER staff and catheter still was not working appropriately.  Urologic consultation was sought.  Thus, we examined the patient and evaluated him, and it was felt this likely represented urethral trauma.  His catheter was found to be repositioned gently in the emergency room, this was not successful, so the most prudent management would be  operative endoscopy with cysto clot evacuation, and catheter placement complicated.  This was not attempted in the ER as the patient was significantly confused at baseline.  This was discussed the patient's wife and wished to proceed.  Informed consent was same placed in medical record.  DESCRIPTION OF PROCEDURE:  The patient being Micha Dosanjh, was verified. Procedure being cystoscopy clot evacuation was confirmed.  Procedure was carried out.  Time-out was performed.  Intravenous antibiotics administered.  General LMA anesthesia was introduced.  The patient placed into a low lithotomy position.  Sterile field was created by prepping the patient's penis, perineum, and proximal thighs using iodine x3.  Next, the previous catheter was removed.  Cystourethroscopy was performed using a 22-French rigid cystoscope with 30-degree offset lens.  Inspection of the anterior and posterior urethra revealed approximately 30% to 50% mucosal disruption in the proximal pendulous urethra.  This was felt to be likely consistent with malposition of prior catheter and there was significant blood clot within the urethral lumen.  The prostatic urethra was unremarkable. Inspection of bladder revealed formed blood clot and no active bleeding. This clot was irrigated with the To[place]y syringe.  Repeat inspection revealed complete resolution of all blood clot in urinary  bladder. There were no papillary masses or diverticula or calcifications noted. Again the only abnormality at this point being the membranous urethral with the pendulous urethral disruption.  It is felt that the most prudent means of management would be placement of catheter over a wire. As such, a Sensor wire was advanced at the level of the urinary bladder. The cystoscope and a new 24-French Councill catheter was placed using a wire guidance.  This immediately effluxed clear urine.  10 mL of water was placed in the balloon and this irrigated  quantitatively confirming position and this was connected to straight drain.  The procedure was terminated. The patient tolerated the procedure well.  There were no immediate periprocedural complications.  The patient was taken to the Postanesthesia Care Unit in stable condition.          ______________________________ Sebastian Acheheodore Pete Schnitzer, MD     TM/MEDQ  D:  09/02/2014  T:  09/02/2014  Job:  [id number]

## 2014-09-02 NOTE — ED Provider Notes (Signed)
CSN: [id number]     Arrival date & time 09/01/14  2337 History   First MD Initiated Contact with Patient 09/02/14 0000     No chief complaint on file.    (Consider location/radiation/quality/duration/timing/severity/associated sxs/prior Treatment) Patient is a 81 y.o. male presenting with hematuria. The history is provided by the patient and medical records.  Hematuria This is a new problem. The current episode started yesterday. The problem occurs constantly. The problem has not changed since onset.Pertinent negatives include no chest pain, no abdominal pain, no headaches and no shortness of breath. Nothing aggravates the symptoms. Nothing relieves the symptoms. He has tried nothing for the symptoms. The treatment provided no relief.  blood in the foley.  Blood around the meatus per nursing home staff patient pulled out his own foley 2 days ago.  It was replaced at the facility and has had clots and bleeding at the meatus for 2 days.  Now foley obstructed.    Past Medical History  Diagnosis Date  . DIABETES MELLITUS, II, COMPLICATIONS 1/47/8295  . Esophageal perforation 1993    managed at [facility]  . PPD positive 1993    Found at [facility]  . Spinal stenosis of lumbar region at multiple levels   . Proteinuria 08/07/2006    Qualifier: Diagnosis of  By: Drucie Ip    . Open angle with borderline findings and low glaucoma risk in both eyes 02/23/2013  . Regular astigmatism 02/23/2013  . Presbyopia 02/23/2013  . Ptosis of left eyelid 02/23/2013  . At risk for falling 10/04/2013  . GAIT DISTURBANCE 08/01/2008        . HYPERLIPIDEMIA 08/07/2006    Qualifier: Diagnosis of  By: Drucie Ip    . HYPERTENSION, BENIGN SYSTEMIC 08/07/2006    Qualifier: Diagnosis of  By: Drucie Ip    . 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 LUMBAR SPRAIN AND STRAIN 07/19/2008    Qualifier: Diagnosis of  By: Lindell Noe MD, Jeneen Rinks    . Myopia with presbyopia of left eye  02/23/2013  . OBESITY, NOS 08/07/2006    Qualifier: Diagnosis of  By: Drucie Ip    . OSTEOARTHRITIS, LOWER LEG 08/07/2006    Qualifier: Diagnosis of  By: Drucie Ip    . PERIPHERAL EDEMA 08/01/2008    Qualifier: Diagnosis of  By: Walker Kehr MD, Patrick Jupiter    . Peripheral vascular disease, unspecified 04/21/2012    Severe tibial occlusive disease (Dr Kellie Simmering, [facility], 04/2002)     . Recurrent falls 10/04/2013  . Skin ulcer of foot, left heel 02/23/2013  . Type II or unspecified type diabetes mellitus without mention of complication, not stated as uncontrolled 08/07/2006    Qualifier: Diagnosis of  By: Drucie Ip    . Unspecified hereditary and idiopathic peripheral neuropathy 08/07/2006        . Proteinuria 08/07/2006  . Impairment of balance 05/10/2014  . Urge incontinence 05/10/2014  . Impaired mobility and ADLs 05/10/2014  . Diabetes mellitus due to underlying condition with diabetic polyneuropathy 08/07/2006    Qualifier: Diagnosis of  By: Drucie Ip    . Difficulty in voiding 08/22/2014  . 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 Chronic systolic heart failure 11/28/3084  . Mild to Moderate aortic regurgitation 08/22/2014  . Heart failure with reduced ejection fraction 08/22/2014  . Discitis of lumbar region 08/22/2014    Hyperintense signal on Lumbar MRI 08/12/14 in L2-L3 disc.   Past Surgical History  Procedure  Laterality Date  . Neck surgery  1993    s/p C1-2, C2-3 anterior cervical diskectomies, partial corpectomies through thoracotomy at [place]  . Cataract extraction      Cataract surgery   . Thoracotomy  1993    s/p T3-T4 osteomyelitis with multiple open drainages including thoracotomy at [place]  . Spinal cord decompression  1993    s/p T1-T2 spinal cord compression secondary to granualtion tissue and abscess material secondary to previous mediastinitis at [place]  . Jejunostomy feeding tube  1993    s/p Feeding jejunostomy [place]  . Esophageal perforation  1993     Hx of esophageal perforation complication 4401  . Lumbar epidural injection  2010    Epidural corticosteroid injection for L2-3 & L4-5 severe spinal stenosis at [place] on 9/10, 10/10, 11/10    . Lumbar laminectomy/decompression microdiscectomy N/A 08/13/2014    Procedure: LUMBAR LAMINECTOMY ;  Surgeon: Jovita Gamma, MD;  Location: [facility];  Service: Neurosurgery;  Laterality: N/A;   Family History  Problem Relation Age of Onset  . Diabetes Mother   . Diabetes Father   . Hypertension Father   . Other Father     amputaion  . Diabetes Son   . Hypertension Son    History  Substance Use Topics  . Smoking status: Former Smoker    Types: Cigarettes    Quit date: 04/22/1987  . Smokeless tobacco: Never Used  . Alcohol Use: No    Review of Systems  Respiratory: Negative for shortness of breath.   Cardiovascular: Negative for chest pain.  Gastrointestinal: Negative for abdominal pain.  Genitourinary: Positive for hematuria.  Neurological: Negative for headaches.  All other systems reviewed and are negative.     Allergies  Naprosyn; Rofecoxib; and Tramadol hcl  Home Medications   Prior to Admission medications   Medication Sig Start Date End Date Taking? Authorizing Provider  acetaminophen (TYLENOL) 500 MG tablet Take 500 mg by mouth every 6 (six) hours.    Historical Provider, MD  Ascorbic Acid (VITAMIN C) 500 MG CHEW Chew by mouth daily.      Historical Provider, MD  aspirin 81 MG tablet Take 81 mg by mouth daily.      Historical Provider, MD  calcium-vitamin D (OSCAL WITH D 250-125) 250-125 MG-UNIT per tablet Take 1 tablet by mouth daily.      Historical Provider, MD  cefTRIAXone (ROCEPHIN) 40 MG/ML IVPB Inject 50 mLs (2 g total) into the vein daily. 08/19/14 09/27/14  Katheren Shams, DO  Fish Oil OIL Take 1,000 mg by mouth daily.     Historical Provider, MD  food thickener (THICK IT) POWD Take 1 Container by mouth as needed (Per Dsyphagia 3 Diet, to make  liquids Nectar Thi\ck). 08/18/14   Katheren Shams, DO  gabapentin (NEURONTIN) 300 MG capsule Take 1 capsule (300 mg total) by mouth 2 (two) times daily. 08/18/14   Katheren Shams, DO  insulin aspart (NOVOLOG) 100 UNIT/ML injection Inject 3 Units into the skin 3 (three) times daily with meals. 08/18/14   Katheren Shams, DO  insulin glargine (LANTUS) 100 UNIT/ML injection Inject 0.2 mLs (20 Units total) into the skin at bedtime. 08/18/14   Katheren Shams, DO  lisinopril (PRINIVIL,ZESTRIL) 20 MG tablet Take 1 tablet (20 mg total) by mouth at bedtime. 02/09/14   Blane Ohara McDiarmid, MD  Multiple Vitamin (MULTIVITAMIN) tablet Take 1 tablet by mouth daily.      Historical  Provider, MD  oxyCODONE (OXY IR/ROXICODONE) 5 MG immediate release tablet Take 1 tablet (5 mg total) by mouth 4 (four) times daily. 08/30/14   Blane Ohara McDiarmid, MD  oxyCODONE (OXY IR/ROXICODONE) 5 MG immediate release tablet Take 0.5 tablets (2.5 mg total) by mouth every 4 (four) hours as needed for breakthrough pain. 08/30/14   Blane Ohara McDiarmid, MD  Pyridoxine HCl (VITAMIN B-6) 100 MG tablet 3 tabs once daily.     Historical Provider, MD  senna-docusate (SENOKOT-S) 8.6-50 MG per tablet Take 2 tablets by mouth 2 (two) times daily. 08/18/14   Katheren Shams, DO  vitamin B-12 (CYANOCOBALAMIN) 1000 MCG tablet Take 1,000 mcg by mouth daily.      Historical Provider, MD   BP 137/80 mmHg  Pulse 95  Temp(Src) 98.7 F (37.1 C) (Oral)  Resp 16  SpO2 100% Physical Exam  Constitutional: He appears well-developed and well-nourished. No distress.  HENT:  Head: Normocephalic and atraumatic.  Mouth/Throat: Oropharynx is clear and moist.  Eyes: Conjunctivae are normal. Pupils are equal, round, and reactive to light.  Neck: Normal range of motion. Neck supple.  Cardiovascular: Normal rate, regular rhythm and intact distal pulses.   Pulmonary/Chest: Effort normal and breath sounds normal. No respiratory distress. He has no wheezes. He has no rales.   Abdominal: Soft. Bowel sounds are normal. There is no tenderness. There is no rebound and no guarding.  Genitourinary:  Blood at urethral meatus and clots in the bag immediately upon seeing patient  Musculoskeletal: He exhibits edema. He exhibits no tenderness.  Neurological: He is alert. He has normal reflexes.  Skin: Skin is warm and dry.  Psychiatric: He has a normal mood and affect.    ED Course  Procedures (including critical care time) Labs Review Labs Reviewed  CBC WITH DIFFERENTIAL/PLATELET  URINALYSIS, ROUTINE W REFLEX MICROSCOPIC  I-STAT CHEM 8, ED    Imaging Review Ir Fluoro Guide Cv Line Right  08/31/2014   CLINICAL DATA:  Poor venous access.  Request PICC line placement.  EXAM: IR RIGHT FLUORO GUIDE CV LINE; IR ULTRASOUND GUIDANCE VASC ACCESS RIGHT  FLUOROSCOPY TIME:  6 seconds.  TECHNIQUE: The right arm was prepped with chlorhexidine, draped in the usual sterile fashion using maximum barrier technique (cap and mask, sterile gown, sterile gloves, large sterile sheet, hand hygiene and cutaneous antiseptic). Local anesthesia was attained by infiltration with 1% lidocaine.  Ultrasound demonstrated patency of the basilic vein, and this was documented with an image. Under real-time ultrasound guidance, this vein was accessed with a 21 gauge micropuncture needle and image documentation was performed. The needle was exchanged over a guidewire for a peel-away sheath through which a 40 cm 5 [place] single lumen power injectable PICC was advanced, and positioned with its tip at the lower SVC/right atrial junction. Fluoroscopy during the procedure and fluoro spot radiograph confirms appropriate catheter position. The catheter was flushed, secured to the skin with Prolene sutures, and covered with a sterile dressing.  COMPLICATIONS: None.  The patient tolerated the procedure well.  IMPRESSION: Successful placement of a right arm PICC with sonographic and fluoroscopic guidance. The catheter is  ready for use.  Read by Ascencion Dike PA-C   Electronically Signed   By: Markus Daft M.D.   On: 08/31/2014 08:47   Ir US Guide Vasc Access Right  08/31/2014   CLINICAL DATA:  Poor venous access.  Request PICC line placement.  EXAM: IR RIGHT FLUORO GUIDE CV LINE; IR ULTRASOUND GUIDANCE VASC ACCESS RIGHT  FLUOROSCOPY TIME:  6 seconds.  TECHNIQUE: The right arm was prepped with chlorhexidine, draped in the usual sterile fashion using maximum barrier technique (cap and mask, sterile gown, sterile gloves, large sterile sheet, hand hygiene and cutaneous antiseptic). Local anesthesia was attained by infiltration with 1% lidocaine.  Ultrasound demonstrated patency of the basilic vein, and this was documented with an image. Under real-time ultrasound guidance, this vein was accessed with a 21 gauge micropuncture needle and image documentation was performed. The needle was exchanged over a guidewire for a peel-away sheath through which a 40 cm 5 [place] single lumen power injectable PICC was advanced, and positioned with its tip at the lower SVC/right atrial junction. Fluoroscopy during the procedure and fluoro spot radiograph confirms appropriate catheter position. The catheter was flushed, secured to the skin with Prolene sutures, and covered with a sterile dressing.  COMPLICATIONS: None.  The patient tolerated the procedure well.  IMPRESSION: Successful placement of a right arm PICC with sonographic and fluoroscopic guidance. The catheter is ready for use.  Read by Ascencion Dike PA-C   Electronically Signed   By: Markus Daft M.D.   On: 08/31/2014 08:47     EKG Interpretation None      MDM   Final diagnoses:  None    Patient had blood in foley bag and around meatus on arrival.    Medications  cefTRIAXone (ROCEPHIN) 1 g in dextrose 5 % 50 mL IVPB (1 g Intravenous New Bag/Given 09/02/14 0557)  lidocaine (XYLOCAINE) 2 % jelly 1 application (1 application Urethral Given 09/02/14 0116)    Results for orders  placed or performed during the hospital encounter of 09/01/14  CBC with Differential/Platelet  Result Value Ref Range   WBC 9.1 4.0 - 10.5 K/uL   RBC 3.64 (L) 4.22 - 5.81 MIL/uL   Hemoglobin 9.8 (L) 13.0 - 17.0 g/dL   HCT 31.3 (L) 39.0 - 52.0 %   MCV 86.0 78.0 - 100.0 fL   MCH 26.9 26.0 - 34.0 pg   MCHC 31.3 30.0 - 36.0 g/dL   RDW 16.8 (H) 11.5 - 15.5 %   Platelets 290 150 - 400 K/uL   Neutrophils Relative % 78 (H) 43 - 77 %   Neutro Abs 7.0 1.7 - 7.7 K/uL   Lymphocytes Relative 12 12 - 46 %   Lymphs Abs 1.1 0.7 - 4.0 K/uL   Monocytes Relative 8 3 - 12 %   Monocytes Absolute 0.7 0.1 - 1.0 K/uL   Eosinophils Relative 2 0 - 5 %   Eosinophils Absolute 0.2 0.0 - 0.7 K/uL   Basophils Relative 0 0 - 1 %   Basophils Absolute 0.0 0.0 - 0.1 K/uL  Urinalysis, Routine w reflex microscopic  Result Value Ref Range   Color, Urine RED (A) YELLOW   APPearance TURBID (A) CLEAR   Specific Gravity, Urine 1.010 1.005 - 1.030   pH 6.0 5.0 - 8.0   Glucose, UA 100 (A) NEGATIVE mg/dL   Hgb urine dipstick LARGE (A) NEGATIVE   Bilirubin Urine NEGATIVE NEGATIVE   Ketones, ur NEGATIVE NEGATIVE mg/dL   Protein, ur 30 (A) NEGATIVE mg/dL   Urobilinogen, UA 0.2 0.0 - 1.0 mg/dL   Nitrite NEGATIVE NEGATIVE   Leukocytes, UA MODERATE (A) NEGATIVE  Urine microscopic-add on  Result Value Ref Range   RBC / HPF TOO NUMEROUS TO COUNT <3 RBC/hpf   Urine-Other FIELD OBSCURED BY RBC'S   I-stat chem 8, ed  Result Value Ref Range  Sodium 143 135 - 145 mmol/L   Potassium 3.9 3.5 - 5.1 mmol/L   Chloride 103 96 - 112 mmol/L   BUN 18 6 - 23 mg/dL   Creatinine, Ser 1.00 0.50 - 1.35 mg/dL   Glucose, Bld 256 (H) 70 - 99 mg/dL   Calcium, Ion 1.20 1.13 - 1.30 mmol/L   TCO2 23 0 - 100 mmol/L   Hemoglobin 10.5 (L) 13.0 - 17.0 g/dL   HCT 31.0 (L) 39.0 - 52.0 %   Dg Chest 2 View  08/12/2014   CLINICAL DATA:  Low back pain without trauma.  EXAM: CHEST  2 VIEW  COMPARISON:  None.  FINDINGS: Lateral view degraded by patient  arm position. Lower thoracic spondylosis is moderate. Surgical clips which project over the right upper lobe and right lung apex medially. Midline trachea. Marked cardiomegaly with atherosclerosis in the transverse aorta. No pleural effusion or pneumothorax. No congestive failure. Clear lungs.  IMPRESSION: Mild cardiomegaly and aortic atherosclerosis.  No acute findings.   Electronically Signed   By: Abigail Miyamoto M.D.   On: 08/12/2014 20:05   Dg Thoracic Spine 2 View  08/12/2014   CLINICAL DATA:  Thoracic back pain.  EXAM: THORACIC SPINE - 2 VIEW  COMPARISON:  None.  FINDINGS: There is no evidence of thoracic spine fracture. Alignment is normal. Degenerative osteophytosis is seen at multiple levels in the mid lower thoracic spine and the upper lumbar spine. No focal lytic or sclerotic bone lesions identified.  IMPRESSION: No acute findings.  Degenerative spondylosis, as described above.   Electronically Signed   By: Earle Gell M.D.   On: 08/12/2014 20:06   Dg Lumbar Spine 2-3 Views  08/13/2014   CLINICAL DATA:  L2-3 lumbar laminectomy.  EXAM: LUMBAR SPINE - 2-3 VIEW  COMPARISON:  08/13/2014 MR and prior studies  FINDINGS: Lateral intraoperative views of the lumbar spine are submitted postoperatively for interpretation.  The same numbering system will be utilized as on the recent MR.  Film 1 demonstrates 2 posterior metallic probes, the first directed at the T12-L1 interspace an the second directed at the L2 vertebral body.  Film 2 demonstrates a posterior metallic probe in between the L2 and L3 spinous processes.  Film 3 demonstrates a posterior metallic probe directed at the L2-3 interspace.  IMPRESSION: Haematologist Signed   By: Margarette Canada M.D.   On: 08/13/2014 19:13   Dg Lumbar Spine Complete  08/12/2014   CLINICAL DATA:  No injury.  Low back pain.  EXAM: LUMBAR SPINE - COMPLETE 4+ VIEW  COMPARISON:  MRI 02/17/2009 lumbar spine  FINDINGS: Five lumbar type vertebral bodies. Mild superior  endplate irregularity at L3 is unchanged. Advanced degenerative disc disease and spondylosis. L2 through S1. No malalignment. Advanced facet arthropathy within the lower lumbar spine and lumbosacral junction. No acute vertebral body height loss. Aortic and branch vessel atherosclerosis.  IMPRESSION: Advanced lumbar spondylosis, without acute superimposed process.   Electronically Signed   By: Abigail Miyamoto M.D.   On: 08/12/2014 20:03   Ct Head Wo Contrast  08/18/2014   CLINICAL DATA:  Acute delirium.  EXAM: CT HEAD WITHOUT CONTRAST  TECHNIQUE: Contiguous axial images were obtained from the base of the skull through the vertex without intravenous contrast.  COMPARISON:  Radiograph 08/08/2014  FINDINGS: No acute intracranial hemorrhage. No focal mass lesion. No CT evidence of acute infarction. No midline shift or mass effect. No hydrocephalus. Basilar cisterns are patent.  There is extensive cortical atrophy  and proportional ventricular dilatation. Extensive periventricular white matter hypodensities unchanged from prior.  Paranasal sinuses and  mastoid air cells are clear.  IMPRESSION: 1. No acute intracranial findings. 2. Atrophy and chronic microvascular disease similar to comparison exam.   Electronically Signed   By: Suzy Bouchard M.D.   On: 08/18/2014 18:41   Ct Head Wo Contrast  08/08/2014   CLINICAL DATA:  PT FELL GETTING OFF TOILET TODAY. PT DID NOT HAVE LOC OR HIT HIS HEAD. PT STATES NO PAIN  EXAM: CT HEAD WITHOUT CONTRAST  TECHNIQUE: Contiguous axial images were obtained from the base of the skull through the vertex without intravenous contrast.  COMPARISON:  None.  FINDINGS: No acute intracranial hemorrhage. No focal mass lesion. No CT evidence of acute infarction. No midline shift or mass effect. No hydrocephalus. Basilar cisterns are patent.  There is extensive cortical atrophy and proportional ventricular dilatation. Extensive periventricular white matter hypodensities.  Paranasal sinuses and   mastoid air cells are clear.  IMPRESSION: 1. No intracranial trauma. 2. Extensive cortical atrophy. 3. Chronic white matter microvascular disease.   Electronically Signed   By: Suzy Bouchard M.D.   On: 08/08/2014 18:04   Mr Thoracic Spine Wo Contrast  08/13/2014   CLINICAL DATA:  Lower extremity weakness. Multiple falls. Lower back and lower extremity pain. Remote history of upper thoracic spinal infection.  EXAM: MRI THORACIC SPINE WITHOUT CONTRAST  TECHNIQUE: Multiplanar, multisequence MR imaging of the thoracic spine was performed. No intravenous contrast was administered.  COMPARISON:  Thoracic spine radiographs 08/12/2014  FINDINGS: The examination had to be discontinued prior to completion as the patient is urgently going to the operating room for lumbar surgery. Axial merge images of the lower thoracic spine were not obtained. Images are mildly degraded by motion artifact.  Limited imaging of the cervical spine for localization purposes demonstrates multilevel spondylosis with large anterior osteophytes at C3-4 and likely multilevel intervertebral osseous fusion in the mid and lower cervical spine.  There is no significant listhesis in the thoracic spine. There appears to be intervertebral osseous fusion from T1-T4. The T3 vertebral body appears small, likely reflecting prior erosion given remote history of osteomyelitis. There may be involvement of the T2 and T4 vertebral bodies as well. Thoracic lumbar vertebral body heights are preserved elsewhere. Vertebral bone marrow signal is moderately diminished diffusely. No marrow or disc space signal suggestive of active infection is identified.  Thoracic spinal cord is normal in signal. There is mild spinal stenosis at C7-T1 due to disc bulging and infolding of the ligamentum flavum. There is mild disc bulging at T6-7 greater than T9-10 and T10-11 without significant spinal stenosis. Disc bulging and mild facet hypertrophy at T12-L1 result in borderline  spinal stenosis and mild bilateral neural foraminal stenosis. There is also mild right neural foraminal stenosis at T10-11.  The thyroid is incompletely imaged but appears diffusely enlarged with several small nodules noted. Dilated, fluid-filled structure in the posterior mediastinum may reflect a gastric pull-through or patulous esophagus.  IMPRESSION: 1. No evidence of active infection in the thoracic spine. 2. Appearance of the upper thoracic spine as above likely reflecting remote history of osteomyelitis and fusion. 3. Mild thoracic spondylosis without evidence of significant stenosis. 4. Gastric pull-through versus patulous thoracic esophagus.   Electronically Signed   By: Logan Bores   On: 08/13/2014 15:56   Mr Lumbar Spine Wo Contrast  08/13/2014   ADDENDUM REPORT: 08/13/2014 12:42  ADDENDUM: These results were called by telephone at the  time of interpretation on 08/13/2014 at 12:41 pm to Dr. Minda Ditto, who verbally acknowledged these results.   Electronically Signed   By: Richardean Sale M.D.   On: 08/13/2014 12:42   08/13/2014   CLINICAL DATA:  Low back pain for 2 months. No known injury. Remote back surgery. Initial encounter.  EXAM: MRI LUMBAR SPINE WITHOUT CONTRAST  TECHNIQUE: Multiplanar, multisequence MR imaging of the lumbar spine was performed. No intravenous contrast was administered.  COMPARISON:  Radiographs 08/12/2014.  MRI 02/17/2009.  FINDINGS: Radiographs demonstrate no visible ribs at T12 and 5 lumbar type vertebral bodies. This corresponds with the numbering utilized on the prior MRI. The alignment is stable with 4 mm of anterolisthesis at L4-5. There are endplate degenerative changes throughout the lumbar spine which are similar to the prior study. There is increased T2 hyperintensity within the L2-3 disc without gross endplate destruction. Underlying deformity of the superior endplate of L3 appears unchanged.  The conus medullaris extends to the L1 level and appears normal. Extensive  tortuosity of the nerve roots is again noted within the upper lumbar spinal canal. There is new ill-defined T2 hyperintensity within the right psoas muscle. No focal fluid collection identified.  T12-L1: Mildly progressive annular disc bulging with mild facet and ligamentous hypertrophy. No spinal stenosis or nerve root encroachment.  L1-2: Disc height and hydration are maintained. No spinal stenosis or nerve root encroachment.  L2-3: Previously, the patient had moderate to severe multifactorial spinal stenosis at this level. There is progressive disc degeneration with annular bulging and T2 hyperintensity within the disc as described above. There is resulting near complete effacement of the thecal sac consistent with severe spinal stenosis. Severe foraminal narrowing is present bilaterally with probable bilateral L2 nerve root encroachment. Facet and ligamentous hypertrophy does not appear significantly changed. There is no gross bone destruction.  L3-4: Chronic degenerative disc disease with loss of disc height, annular disc bulging and posterior osteophytes. There is moderate facet and ligamentous hypertrophy. The resulting spinal stenosis appears mildly progressive, moderate in degree. Moderate left greater than right foraminal stenosis appears about the same.  L4-5: Chronic degenerative disc disease with loss of disc height, annular disc bulging and posterior osteophytes. Moderate to severe multifactorial spinal stenosis again noted. At least moderate right greater than left lateral recess and foraminal stenosis is similar to the prior study.  L5-S1: Chronic degenerative disc disease with loss of disc height, annular disc bulging and posterior osteophytes. The resulting mild spinal, lateral recess and foraminal stenosis bilaterally does not appear significantly changed.  IMPRESSION: 1. Compared with the prior MRI from 02/17/2009, there are significantly progressive changes at the L2-3 disc space level with  progressive disc bulging and discal T2 hyperintensity. These findings compress the thecal sac, resulting in severe spinal and biforaminal stenosis. Although the discal hyperintensity is new, there is no progressive bone destruction to suggest osteomyelitis. However, there is apparently a history of spinal osteomyelitis, and new T2 hyperintensity is demonstrated within the right psoas muscle such that disc space and paraspinal infection cannot be completely excluded. 2. Chronic spondylosis at L3-4, L4-5 and L5-S1. There is mildly progressive spinal stenosis at L3-4 without definite acute findings. 3. These results will be called to the ordering clinician or representative by the Radiologist Assistant, and communication documented in the PACS or zVision Dashboard.  Electronically Signed: By: Richardean Sale M.D. On: 08/13/2014 12:07   Ir Fluoro Guide Cv Line Right  08/31/2014   CLINICAL DATA:  Poor venous access.  Request  PICC line placement.  EXAM: IR RIGHT FLUORO GUIDE CV LINE; IR ULTRASOUND GUIDANCE VASC ACCESS RIGHT  FLUOROSCOPY TIME:  6 seconds.  TECHNIQUE: The right arm was prepped with chlorhexidine, draped in the usual sterile fashion using maximum barrier technique (cap and mask, sterile gown, sterile gloves, large sterile sheet, hand hygiene and cutaneous antiseptic). Local anesthesia was attained by infiltration with 1% lidocaine.  Ultrasound demonstrated patency of the basilic vein, and this was documented with an image. Under real-time ultrasound guidance, this vein was accessed with a 21 gauge micropuncture needle and image documentation was performed. The needle was exchanged over a guidewire for a peel-away sheath through which a 40 cm 5 [place] single lumen power injectable PICC was advanced, and positioned with its tip at the lower SVC/right atrial junction. Fluoroscopy during the procedure and fluoro spot radiograph confirms appropriate catheter position. The catheter was flushed, secured to the  skin with Prolene sutures, and covered with a sterile dressing.  COMPLICATIONS: None.  The patient tolerated the procedure well.  IMPRESSION: Successful placement of a right arm PICC with sonographic and fluoroscopic guidance. The catheter is ready for use.  Read by Ascencion Dike PA-C   Electronically Signed   By: Markus Daft M.D.   On: 08/31/2014 08:47   Ir US Guide Vasc Access Right  08/31/2014   CLINICAL DATA:  Poor venous access.  Request PICC line placement.  EXAM: IR RIGHT FLUORO GUIDE CV LINE; IR ULTRASOUND GUIDANCE VASC ACCESS RIGHT  FLUOROSCOPY TIME:  6 seconds.  TECHNIQUE: The right arm was prepped with chlorhexidine, draped in the usual sterile fashion using maximum barrier technique (cap and mask, sterile gown, sterile gloves, large sterile sheet, hand hygiene and cutaneous antiseptic). Local anesthesia was attained by infiltration with 1% lidocaine.  Ultrasound demonstrated patency of the basilic vein, and this was documented with an image. Under real-time ultrasound guidance, this vein was accessed with a 21 gauge micropuncture needle and image documentation was performed. The needle was exchanged over a guidewire for a peel-away sheath through which a 40 cm 5 [place] single lumen power injectable PICC was advanced, and positioned with its tip at the lower SVC/right atrial junction. Fluoroscopy during the procedure and fluoro spot radiograph confirms appropriate catheter position. The catheter was flushed, secured to the skin with Prolene sutures, and covered with a sterile dressing.  COMPLICATIONS: None.  The patient tolerated the procedure well.  IMPRESSION: Successful placement of a right arm PICC with sonographic and fluoroscopic guidance. The catheter is ready for use.  Read by Ascencion Dike PA-C   Electronically Signed   By: Markus Daft M.D.   On: 08/31/2014 08:47   Ir US Guide Vasc Access Right  08/23/2014   INDICATION: Bacteremia. Poor venous access. In need of durable intravenous access for  antibiotic administration  EXAM: ULTRASOUND AND FLUOROSCOPIC GUIDED PICC LINE INSERTION  MEDICATIONS: None.  CONTRAST:  None  FLUOROSCOPY TIME:  6 seconds (2 mGy)  COMPLICATIONS: None immediate  TECHNIQUE: The procedure, risks, benefits, and alternatives were explained to the patient and informed written consent was obtained. A timeout was performed prior to the initiation of the procedure.  The right upper extremity was prepped with chlorhexidine in a sterile fashion, and a sterile drape was applied covering the operative field. Maximum barrier sterile technique with sterile gowns and gloves were used for the procedure. A timeout was performed prior to the initiation of the procedure. Local anesthesia was provided with 1% lidocaine.  Under direct ultrasound guidance,  the right basilic vein was accessed with a micropuncture kit after the overlying soft tissues were anesthetized with 1% lidocaine. An ultrasound image was saved for documentation purposes. A guidewire was advanced to the level of the superior caval-atrial junction for measurement purposes and the PICC line was cut to length. A peel-away sheath was placed and a 41 cm, 5 [place], single lumen was inserted to level of the superior caval-atrial junction. A post procedure spot fluoroscopic was obtained. The catheter easily aspirated and flushed and was sutured in place. A dressing was placed. The patient tolerated the procedure well without immediate post procedural complication.  FINDINGS: After catheter placement, the tip lies within the superior cavoatrial junction. The catheter aspirates and flushes normally and is ready for immediate use.  IMPRESSION: Successful ultrasound and fluoroscopic guided placement of a right basilic vein approach, 41 cm, 5 French, single lumen PICC with tip at the superior caval-atrial junction. The PICC line is ready for immediate use.   Electronically Signed   By: Sandi Mariscal M.D.   On: 08/23/2014 15:46   Dg Chest Port 1  View  08/17/2014   CLINICAL DATA:  PICC line placement.  EXAM: PORTABLE CHEST - 1 VIEW  COMPARISON:  08/12/2014  FINDINGS: The left PICC line is coiled in a branch vessel of the subclavian vein, likely the lateral thoracic vein. The should be retracted and repositioned. The heart and lungs are stable.  IMPRESSION: The left PICC line tip is coiled in a branch of the left subclavian vein and should be retracted and repositioned.   Electronically Signed   By: Marijo Sanes M.D.   On: 08/17/2014 15:05   Dg Swallowing Func-speech Pathology  08/16/2014    Objective Swallowing Evaluation:    Patient Details  Name: NAHEIM BURGEN MRN: [id number] Date of Birth: 05/04/1933  Today's Date: 08/16/2014 Time: SLP Start Time (ACUTE ONLY): 1547-SLP Stop Time (ACUTE ONLY): 1610 SLP Time Calculation (min) (ACUTE ONLY): 23 min  Past Medical History:  Past Medical History  Diagnosis Date  . DIABETES MELLITUS, II, COMPLICATIONS 4/74/2595  . Esophageal perforation 1993    managed at [facility]  . PPD positive 1993    Found at [facility]  . Spinal stenosis of lumbar region at multiple levels   . Proteinuria 08/07/2006    Qualifier: Diagnosis of  By: Drucie Ip    . Open angle with borderline findings and low glaucoma risk in both eyes  02/23/2013  . Regular astigmatism 02/23/2013  . Presbyopia 02/23/2013  . Ptosis of left eyelid 02/23/2013  . At risk for falling 10/04/2013  . GAIT DISTURBANCE 08/01/2008        . HYPERLIPIDEMIA 08/07/2006    Qualifier: Diagnosis of  By: Drucie Ip    . HYPERTENSION, BENIGN SYSTEMIC 08/07/2006    Qualifier: Diagnosis of  By: Drucie Ip    . 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 LUMBAR SPRAIN AND STRAIN 07/19/2008    Qualifier: Diagnosis of  By: Lindell Noe MD, Jeneen Rinks    . Myopia with presbyopia of left eye 02/23/2013  . OBESITY, NOS 08/07/2006    Qualifier: Diagnosis of  By: Drucie Ip    . OSTEOARTHRITIS, LOWER LEG 08/07/2006    Qualifier: Diagnosis of  By: Drucie Ip    .  PERIPHERAL EDEMA 08/01/2008    Qualifier: Diagnosis of  By: Walker Kehr MD, Patrick Jupiter    . Peripheral vascular disease, unspecified 04/21/2012    Severe  tibial occlusive disease (Dr Kellie Simmering, [facility], 04/2002)     . Recurrent falls 10/04/2013  . Skin ulcer of foot, left heel 02/23/2013  . Type II or unspecified type diabetes mellitus without mention of  complication, not stated as uncontrolled 08/07/2006    Qualifier: Diagnosis of  By: Drucie Ip    . Unspecified hereditary and idiopathic peripheral neuropathy 08/07/2006        . Proteinuria 08/07/2006  . Impairment of balance 05/10/2014  . Urge incontinence 05/10/2014  . Impaired mobility and ADLs 05/10/2014   Past Surgical History:  Past Surgical History  Procedure Laterality Date  . Neck surgery  1993    s/p C1-2, C2-3 anterior cervical diskectomies, partial corpectomies  through thoracotomy at [place]  . Cataract extraction      Cataract surgery   . Thoracotomy  1993    s/p T3-T4 osteomyelitis with multiple open drainages including  thoracotomy at [place]  . Spinal cord decompression  1993    s/p T1-T2 spinal cord compression secondary to granualtion tissue and  abscess material secondary to previous mediastinitis at [place]  . Jejunostomy feeding tube  1993    s/p Feeding jejunostomy [place]  . Esophageal perforation  1993    Hx of esophageal perforation complication 1017  . Lumbar epidural injection  2010    Epidural corticosteroid injection for L2-3 & L4-5 severe spinal stenosis  at [place] on 9/10, 10/10, 11/10    . Lumbar laminectomy/decompression microdiscectomy N/A 08/13/2014    Procedure: LUMBAR LAMINECTOMY ;  Surgeon: Jovita Gamma, MD;   Location: [facility];  Service: Neurosurgery;  Laterality: N/A;   HPI:  HPI: 81 yo male with history of frequent falls at home, weakness of legs  progressively worsening over past 2 months. Hx lumbar senosis. Unable to  walk, pain "across back and down legs". Evidence of infection including  psoas; s/p  lumbar laminectomy. Pt reports coughing with PO intake.  No Data Recorded  Assessment / Plan / Recommendation CHL IP CLINICAL IMPRESSIONS 08/16/2014  Dysphagia Diagnosis Moderate pharyngeal phase dysphagia;Moderate cervical  esophageal phase dysphagia    Clinical impression Pt has a moderate pharyngeal and cervical esophageal  dysphagia due to reduced hyolaryngeal elevation/excursion leading to  decreased UES relaxation. Of note, view of UES was limited throughout the  course of the study due to obstruction from pt's shoulders. Pt adequately  protected his airway during his initial swallow, however residual material  from liquids spilled into the laryngeal vestibule upon completion of the  swallow as hyolaryngeal complex returned to resting position. Thin liquids  were aspirated, while nectar and honey thick liquids were both able to be  mostly cleared with SLP cued coughing, as they were resulted in more  shallow penetration.  It is difficult to assess sensation with airway  protection, as once SLP provided prompting for coughing with swallow x1,  pt began to forcefully cough with every swallow regardless of whether  there was any penetration occuring. Recommend Dys 3 diet and nectar thick  liquids with small, single cup sips and hard cough after each sip. SLP to  follow for tolerance and utilization of strategies.      CHL IP TREATMENT RECOMMENDATION 08/16/2014  Treatment Plan Recommendations Therapy as outlined in treatment plan below      CHL IP DIET RECOMMENDATION 08/16/2014  Diet Recommendations Dysphagia 3 (Mechanical Soft);Nectar-thick liquid  Liquid Administration via Cup;No straw  Medication Administration Whole meds with puree  Compensations Slow  rate;Small sips/bites;Hard cough after swallow;Multiple  dry swallows after each bite/sip  Postural Changes and/or Swallow Maneuvers Seated upright 90  degrees;Upright 30-60 min after meal     CHL IP OTHER RECOMMENDATIONS 08/16/2014  Recommended Consults (None)  Oral  Care Recommendations Oral care BID  Other Recommendations Order thickener from pharmacy;Prohibited food  (jello, ice cream, thin soups);Remove water pitcher     CHL IP FOLLOW UP RECOMMENDATIONS 08/16/2014  Follow up Recommendations Inpatient Rehab     CHL IP FREQUENCY AND DURATION 08/16/2014  Speech Therapy Frequency (ACUTE ONLY) min 2x/week  Treatment Duration 2 weeks     Pertinent Vitals/Pain: pain with repositioning, monitored     SLP Swallow Goals     CHL IP REASON FOR REFERRAL 08/16/2014  Reason for Referral Objectively evaluate swallowing function     CHL IP ORAL PHASE 08/16/2014  Lips (None)  Tongue (None)  Mucous membranes (None)  Nutritional status (None)  Other (None)  Oxygen therapy (None)  Oral Phase WFL  Oral - Pudding Teaspoon (None)  Oral - Pudding Cup (None)  Oral - Honey Teaspoon (None)  Oral - Honey Cup (None)  Oral - Honey Syringe (None)  Oral - Nectar Teaspoon (None)  Oral - Nectar Cup (None)  Oral - Nectar Straw (None)  Oral - Nectar Syringe (None)  Oral - Ice Chips (None)  Oral - Thin Teaspoon (None)  Oral - Thin Cup (None)  Oral - Thin Straw (None)  Oral - Thin Syringe (None)  Oral - Puree (None)  Oral - Mechanical Soft (None)  Oral - Regular (None)  Oral - Multi-consistency (None)  Oral - Pill (None)  Oral Phase - Comment (None)      CHL IP PHARYNGEAL PHASE 08/16/2014  Pharyngeal Phase Impaired  Pharyngeal - Pudding Teaspoon (None)  Penetration/Aspiration details (pudding teaspoon) (None)  Pharyngeal - Pudding Cup (None)  Penetration/Aspiration details (pudding cup) (None)  Pharyngeal - Honey Teaspoon (None)  Penetration/Aspiration details (honey teaspoon) (None)  Pharyngeal - Honey Cup Reduced anterior laryngeal mobility;Reduced  laryngeal elevation;Reduced pharyngeal peristalsis;Penetration/Aspiration  after swallow;Pharyngeal residue - pyriform sinuses;Pharyngeal residue -  cp segment  Penetration/Aspiration details (honey cup) Material enters airway, remains  ABOVE vocal cords and not ejected out   Pharyngeal - Honey Syringe (None)  Penetration/Aspiration details (honey syringe) (None)  Pharyngeal - Nectar Teaspoon (None)  Penetration/Aspiration details (nectar teaspoon) (None)  Pharyngeal - Nectar Cup Reduced anterior laryngeal mobility;Reduced  laryngeal elevation;Reduced pharyngeal peristalsis;Penetration/Aspiration  after swallow;Pharyngeal residue - pyriform sinuses;Pharyngeal residue -  cp segment  Penetration/Aspiration details (nectar cup) Material enters airway,  remains ABOVE vocal cords and not ejected out  Pharyngeal - Nectar Straw (None)  Penetration/Aspiration details (nectar straw) (None)  Pharyngeal - Nectar Syringe (None)  Penetration/Aspiration details (nectar syringe) (None)  Pharyngeal - Ice Chips (None)  Penetration/Aspiration details (ice chips) (None)  Pharyngeal - Thin Teaspoon (None)  Penetration/Aspiration details (thin teaspoon) (None)  Pharyngeal - Thin Cup Reduced anterior laryngeal mobility;Reduced  laryngeal elevation;Reduced pharyngeal peristalsis;Penetration/Aspiration  after swallow;Pharyngeal residue - pyriform sinuses;Pharyngeal residue -  cp segment  Penetration/Aspiration details (thin cup) Material enters airway, passes  BELOW cords without attempt by patient to eject out (silent aspiration)  Pharyngeal - Thin Straw (None)  Penetration/Aspiration details (thin straw) (None)  Pharyngeal - Thin Syringe (None)  Penetration/Aspiration details (thin syringe') (None)  Pharyngeal - Puree Reduced anterior laryngeal mobility;Reduced laryngeal  elevation;Reduced pharyngeal peristalsis;Pharyngeal residue - pyriform  sinuses;Pharyngeal residue - cp segment  Penetration/Aspiration details (puree) Material does not enter airway  Pharyngeal -  Mechanical Soft Reduced anterior laryngeal mobility;Reduced  laryngeal elevation;Reduced pharyngeal peristalsis;Pharyngeal residue -  pyriform sinuses;Pharyngeal residue - cp segment  Penetration/Aspiration details (mechanical soft) Material does  not enter  airway  Pharyngeal - Regular (None)  Penetration/Aspiration details (regular) (None)  Pharyngeal - Multi-consistency (None)  Penetration/Aspiration details (multi-consistency) (None)  Pharyngeal - Pill (None)  Penetration/Aspiration details (pill) (None)  Pharyngeal Comment (None)     CHL IP CERVICAL ESOPHAGEAL PHASE 08/16/2014  Cervical Esophageal Phase Impaired  Pudding Teaspoon (None)  Pudding Cup (None)  Honey Teaspoon (None)  Honey Cup Reduced cricopharyngeal relaxation  Honey Syringe (None)  Nectar Teaspoon (None)  Nectar Cup Reduced cricopharyngeal relaxation  Nectar Straw (None)  Nectar Syringe (None)  Thin Teaspoon (None)  Thin Cup Reduced cricopharyngeal relaxation  Thin Straw (None)  Thin Syringe (None)  Cervical Esophageal Comment (None)        Germain Osgood, M.A. CCC-SLP [phone]  Germain Osgood 08/16/2014, 4:37 PM    Ir Cyndy Freeze Guide Cv Midline Picc Right  08/23/2014   INDICATION: Bacteremia. Poor venous access. In need of durable intravenous access for antibiotic administration  EXAM: ULTRASOUND AND FLUOROSCOPIC GUIDED PICC LINE INSERTION  MEDICATIONS: None.  CONTRAST:  None  FLUOROSCOPY TIME:  6 seconds (2 mGy)  COMPLICATIONS: None immediate  TECHNIQUE: The procedure, risks, benefits, and alternatives were explained to the patient and informed written consent was obtained. A timeout was performed prior to the initiation of the procedure.  The right upper extremity was prepped with chlorhexidine in a sterile fashion, and a sterile drape was applied covering the operative field. Maximum barrier sterile technique with sterile gowns and gloves were used for the procedure. A timeout was performed prior to the initiation of the procedure. Local anesthesia was provided with 1% lidocaine.  Under direct ultrasound guidance, the right basilic vein was accessed with a micropuncture kit after the overlying soft tissues were anesthetized with 1% lidocaine. An ultrasound image was saved for  documentation purposes. A guidewire was advanced to the level of the superior caval-atrial junction for measurement purposes and the PICC line was cut to length. A peel-away sheath was placed and a 41 cm, 5 [place], single lumen was inserted to level of the superior caval-atrial junction. A post procedure spot fluoroscopic was obtained. The catheter easily aspirated and flushed and was sutured in place. A dressing was placed. The patient tolerated the procedure well without immediate post procedural complication.  FINDINGS: After catheter placement, the tip lies within the superior cavoatrial junction. The catheter aspirates and flushes normally and is ready for immediate use.  IMPRESSION: Successful ultrasound and fluoroscopic guided placement of a right basilic vein approach, 41 cm, 5 French, single lumen PICC with tip at the superior caval-atrial junction. The PICC line is ready for immediate use.   Electronically Signed   By: Sandi Mariscal M.D.   On: 08/23/2014 15:46   Ir Fluoro Guide Cv Midline Picc Left  08/18/2014   CLINICAL DATA:  Left upper extremity PICC is coiled in the left axilla  EXAM: IR LEFT FLOURO GUIDE CV MIDLINE PICC  FLUOROSCOPY TIME:  4 minutes and 16 seconds.  MEDICATIONS AND MEDICAL HISTORY: None  ANESTHESIA/SEDATION: None  CONTRAST:  None  PROCEDURE: The procedure, risks, benefits, and alternatives were explained to the patient. Questions regarding the procedure were encouraged and answered. The patient understands and consents to the procedure.  The left arm PICC site was prepped with Betadine in a sterile fashion, and a sterile drape was applied covering the  operative field. A sterile gown and sterile gloves were used for the procedure.  The existing PICC was cut and exchanged over a glidewire for a copy catheter. Despite exhaustive efforts, the copy catheter could not be advanced across a glidewire into the central venous system.  A 25 cm PICC was then cut and advanced over the glidewire  to the left subclavian vein. Venous blood was easily aspirated. It was easily flushed with heparinized saline.  FINDINGS: The image demonstrates placement of a left upper extremity midline venous catheter with its tip in the left subclavian vein.  COMPLICATIONS: None  IMPRESSION: Successful left upper extremity midline venous catheter placement with its tip in the left subclavian vein.   Electronically Signed   By: Marybelle Killings M.D.   On: 08/18/2014 08:24   MDM Interpretation: labs (uti) Consults: urology (seen by Dr. Tresa Moore who is taking the patient to the OR.  )     Admit to inpatient step down per Dr. Posey Pronto as Dr. Tresa Moore does not feel it is in patient's best interest to be transferred to [facility].      Veatrice Kells, MD 09/02/14 [phone]

## 2014-09-02 NOTE — Progress Notes (Signed)
Dr. Sampson GoonFitzgerald made aware of patient's temperature in Short Stay- 100.1- orally- made aware of Dr. Emmaline LifeManny's order to still transfer patient back to Nursing Facility

## 2014-09-02 NOTE — Progress Notes (Signed)
[facility]

## 2014-09-02 NOTE — Anesthesia Preprocedure Evaluation (Addendum)
Anesthesia Evaluation  Patient identified by MRN, date of birth, ID band Patient awake    Reviewed: Allergy & Precautions, H&P , NPO status , Patient's Chart, lab work & pertinent test results  Airway Mallampati: II  TM Distance: >3 FB Neck ROM: Full    Dental no notable dental hx. (+) Edentulous Upper, Edentulous Lower, Dental Advisory Given   Pulmonary neg pulmonary ROS, former smoker,  breath sounds clear to auscultation  Pulmonary exam normal       Cardiovascular hypertension, Pt. on medications +CHF + Valvular Problems/Murmurs AI Rhythm:Regular Rate:Normal     Neuro/Psych Depression negative neurological ROS     GI/Hepatic negative GI ROS, Neg liver ROS,   Endo/Other  diabetes, Insulin Dependent  Renal/GU Renal disease  negative genitourinary   Musculoskeletal  (+) Arthritis -, Osteoarthritis,    Abdominal   Peds  Hematology negative hematology ROS (+)   Anesthesia Other Findings   Reproductive/Obstetrics negative OB ROS                          Anesthesia Physical Anesthesia Plan  ASA: III and emergent  Anesthesia Plan: General   Post-op Pain Management:    Induction: Intravenous  Airway Management Planned: LMA and Oral ETT  Additional Equipment:   Intra-op Plan:   Post-operative Plan: Extubation in OR  Informed Consent: I have reviewed the patients History and Physical, chart, labs and discussed the procedure including the risks, benefits and alternatives for the proposed anesthesia with the patient or authorized representative who has indicated his/her understanding and acceptance.   Dental advisory given  Plan Discussed with: CRNA  Anesthesia Plan Comments:         Anesthesia Quick Evaluation

## 2014-09-02 NOTE — Brief Op Note (Signed)
09/01/2014 - 09/02/2014  7:10 AM  PATIENT:  Carlos Andrews  81 y.o. male  PRE-OPERATIVE DIAGNOSIS:  bladder clots  POST-OPERATIVE DIAGNOSIS:  bladder clots, urethral injury  PROCEDURE:  Procedure(s): CYSTO, clot evacuation (N/A)  SURGEON:  Surgeon(s) and Role:    * Sebastian Acheheodore Rea Reser, MD - Primary  PHYSICIAN ASSISTANT:   ASSISTANTS: none   ANESTHESIA:   general  EBL:     BLOOD ADMINISTERED:none  DRAINS: 62F foley to straight drain   LOCAL MEDICATIONS USED:  NONE  SPECIMEN:  No Specimen  DISPOSITION OF SPECIMEN:  N/A  COUNTS:  YES  TOURNIQUET:  * No tourniquets in log *  DICTATION: .Other Dictation: Dictation Number [id number]  PLAN OF CARE: Admit to inpatient   PATIENT DISPOSITION:  PACU - hemodynamically stable.   Delay start of Pharmacological VTE agent (>24hrs) due to surgical blood loss or risk of bleeding: yes

## 2014-09-02 NOTE — ED Notes (Addendum)
Irrigation with a 3 way unsuccessful at this time, minimal drainage through the foley tubing, moderate blood drainage through the penial meatus, pt still c/o of the suprapubic/pain pressure 6/10. EDP notified.

## 2014-09-02 NOTE — Transfer of Care (Signed)
Immediate Anesthesia Transfer of Care Note  Patient: Carlos SentersHerman R Tennant  Procedure(s) Performed: Procedure(s): CYSTO, clot evacuation (N/A)  Patient Location: PACU  Anesthesia Type:General  Level of Consciousness: awake, alert , oriented and patient cooperative  Airway & Oxygen Therapy: Patient Spontanous Breathing and Patient connected to face mask oxygen  Post-op Assessment: Report given to RN, Post -op Vital signs reviewed and stable and Patient moving all extremities  Post vital signs: Reviewed and stable  Last Vitals:  Filed Vitals:   09/02/14 0339  BP: 134/75  Pulse: 101  Temp: 37.5 C  Resp: 22    Complications: No apparent anesthesia complications

## 2014-09-02 NOTE — Progress Notes (Signed)
[facility] LPN[facility].  Report given. Copy of DC instructions to go back w patient

## 2014-09-02 NOTE — ED Notes (Signed)
Irrigation of the foley catheter unsuccessful, 120 mL of irrigation with no output, pre-irriagtion bladder scan amount 165 mL, pt c/o of pain in his suprapubic area that he describes as pressure.

## 2014-09-04 ENCOUNTER — Emergency Department (HOSPITAL_COMMUNITY)
Admission: EM | Admit: 2014-09-04 | Discharge: 2014-09-04 | Disposition: A | Discharge status: Home / Self Care | Payer: Medicare Other | Attending: Emergency Medicine | Admitting: Emergency Medicine

## 2014-09-04 ENCOUNTER — Emergency Department (HOSPITAL_COMMUNITY): Payer: Medicare Other

## 2014-09-04 DIAGNOSIS — I5022 Chronic systolic (congestive) heart failure: Secondary | ICD-10-CM | POA: Diagnosis not present

## 2014-09-04 DIAGNOSIS — Z862 Personal history of diseases of the blood and blood-forming organs and certain disorders involving the immune mechanism: Secondary | ICD-10-CM | POA: Diagnosis not present

## 2014-09-04 DIAGNOSIS — Z0000 Encounter for general adult medical examination without abnormal findings: Secondary | ICD-10-CM | POA: Diagnosis not present

## 2014-09-04 DIAGNOSIS — R509 Fever, unspecified: Secondary | ICD-10-CM | POA: Insufficient documentation

## 2014-09-04 DIAGNOSIS — Z7982 Long term (current) use of aspirin: Secondary | ICD-10-CM | POA: Insufficient documentation

## 2014-09-04 DIAGNOSIS — Z87891 Personal history of nicotine dependence: Secondary | ICD-10-CM | POA: Diagnosis not present

## 2014-09-04 DIAGNOSIS — I10 Essential (primary) hypertension: Secondary | ICD-10-CM | POA: Insufficient documentation

## 2014-09-04 DIAGNOSIS — E669 Obesity, unspecified: Secondary | ICD-10-CM | POA: Diagnosis not present

## 2014-09-04 DIAGNOSIS — Z79899 Other long term (current) drug therapy: Secondary | ICD-10-CM | POA: Insufficient documentation

## 2014-09-04 DIAGNOSIS — Z87828 Personal history of other (healed) physical injury and trauma: Secondary | ICD-10-CM | POA: Insufficient documentation

## 2014-09-04 DIAGNOSIS — Z9181 History of falling: Secondary | ICD-10-CM | POA: Diagnosis not present

## 2014-09-04 DIAGNOSIS — Z8669 Personal history of other diseases of the nervous system and sense organs: Secondary | ICD-10-CM | POA: Diagnosis not present

## 2014-09-04 DIAGNOSIS — M1990 Unspecified osteoarthritis, unspecified site: Secondary | ICD-10-CM | POA: Insufficient documentation

## 2014-09-04 DIAGNOSIS — Z794 Long term (current) use of insulin: Secondary | ICD-10-CM | POA: Insufficient documentation

## 2014-09-04 DIAGNOSIS — R0981 Nasal congestion: Secondary | ICD-10-CM | POA: Diagnosis present

## 2014-09-04 DIAGNOSIS — Z872 Personal history of diseases of the skin and subcutaneous tissue: Secondary | ICD-10-CM | POA: Insufficient documentation

## 2014-09-04 DIAGNOSIS — E119 Type 2 diabetes mellitus without complications: Secondary | ICD-10-CM | POA: Diagnosis not present

## 2014-09-04 DIAGNOSIS — E785 Hyperlipidemia, unspecified: Secondary | ICD-10-CM | POA: Insufficient documentation

## 2014-09-04 LAB — BASIC METABOLIC PANEL
Anion gap: 5 (ref 5–15)
BUN: 19 mg/dL (ref 6–23)
CO2: 26 mmol/L (ref 19–32)
Calcium: 7.7 mg/dL — ABNORMAL LOW (ref 8.4–10.5)
Chloride: 109 mmol/L (ref 96–112)
Creatinine, Ser: 1.2 mg/dL (ref 0.50–1.35)
GFR calc Af Amer: 65 mL/min — ABNORMAL LOW (ref >90)
GFR calc non Af Amer: 56 mL/min — ABNORMAL LOW (ref >90)
Glucose, Bld: 164 mg/dL — ABNORMAL HIGH (ref 70–99)
Potassium: 3.6 mmol/L (ref 3.5–5.1)
Sodium: 140 mmol/L (ref 135–145)

## 2014-09-04 LAB — CBC WITH DIFFERENTIAL/PLATELET
BASOS ABS: 0 10*3/uL (ref 0.0–0.1)
BASOS PCT: 0 % (ref 0–1)
EOS PCT: 1 % (ref 0–5)
Eosinophils Absolute: 0 10*3/uL (ref 0.0–0.7)
HCT: 28.7 % — ABNORMAL LOW (ref 39.0–52.0)
Hemoglobin: 9 g/dL — ABNORMAL LOW (ref 13.0–17.0)
LYMPHS PCT: 8 % — AB (ref 12–46)
Lymphs Abs: 0.4 10*3/uL — ABNORMAL LOW (ref 0.7–4.0)
MCH: 26.8 pg (ref 26.0–34.0)
MCHC: 31.4 g/dL (ref 30.0–36.0)
MCV: 85.4 fL (ref 78.0–100.0)
MONO ABS: 0.2 10*3/uL (ref 0.1–1.0)
Monocytes Relative: 4 % (ref 3–12)
Neutro Abs: 4.5 10*3/uL (ref 1.7–7.7)
Neutrophils Relative %: 87 % — ABNORMAL HIGH (ref 43–77)
Platelets: 200 10*3/uL (ref 150–400)
RBC: 3.36 MIL/uL — AB (ref 4.22–5.81)
RDW: 16.8 % — ABNORMAL HIGH (ref 11.5–15.5)
WBC: 5.2 10*3/uL (ref 4.0–10.5)

## 2014-09-04 LAB — URINE MICROSCOPIC-ADD ON

## 2014-09-04 LAB — URINALYSIS, ROUTINE W REFLEX MICROSCOPIC
BILIRUBIN URINE: NEGATIVE
Glucose, UA: NEGATIVE mg/dL
Ketones, ur: 15 mg/dL — AB
NITRITE: NEGATIVE
PH: 5 (ref 5.0–8.0)
Protein, ur: 100 mg/dL — AB
Specific Gravity, Urine: 1.031 — ABNORMAL HIGH (ref 1.005–1.030)
Urobilinogen, UA: 0.2 mg/dL (ref 0.0–1.0)

## 2014-09-04 LAB — I-STAT CG4 LACTIC ACID, ED: Lactic Acid, Venous: 0.59 mmol/L (ref 0.5–2.0)

## 2014-09-04 LAB — CBG MONITORING, ED: Glucose-Capillary: 154 mg/dL — ABNORMAL HIGH (ref 70–99)

## 2014-09-04 NOTE — ED Provider Notes (Signed)
CSN: [id number]     Arrival date & time 09/04/14  0319 History   First MD Initiated Contact with Patient 09/04/14 [phone]     Chief Complaint  Patient presents with  . Fever  . URI    (Consider location/radiation/quality/duration/timing/severity/associated sxs/prior Treatment) HPI Comments: Patient is a 81 year old male who resides at [place]. He presents to the emergency department today for further evaluation of congestion. Facility also reporting a fever for the past day, though patient is afebrile on arrival. It is unknown whether the patient received medication for fever prior to his transport to the ED. Patient is alert. He is pleasant and has no complaints at this time. He denies chest pain, shortness of breath, abdominal pain, cough, or vomiting/diarrhea. He states he has been eating and drinking well. Patient was discharged from the hospital less than 48 hours ago, at which time he was admitted for operative cystoscopy and clot evacuation for persistent hematuria and blockage of his foley catheter.  Patient is a 81 y.o. male presenting with fever and URI. The history is provided by the patient. No language interpreter was used.  Fever Associated symptoms: no chest pain, no congestion, no cough and no vomiting   URI Presenting symptoms: no congestion, no cough and no fever     Past Medical History  Diagnosis Date  . DIABETES MELLITUS, II, COMPLICATIONS 08/07/2006  . Esophageal perforation 1993    managed at [facility]  . PPD positive 1993    Found at [facility]  . Spinal stenosis of lumbar region at multiple levels   . Proteinuria 08/07/2006    Qualifier: Diagnosis of  By: Haydee Salter    . Open angle with borderline findings and low glaucoma risk in both eyes 02/23/2013  . Regular astigmatism 02/23/2013  . Presbyopia 02/23/2013  . Ptosis of left eyelid 02/23/2013  . At risk for falling 10/04/2013  . GAIT DISTURBANCE 08/01/2008        . HYPERLIPIDEMIA 08/07/2006    Qualifier: Diagnosis of   By: Haydee Salter    . HYPERTENSION, BENIGN SYSTEMIC 08/07/2006    Qualifier: Diagnosis of  By: Haydee Salter    . IMPOTENCE, ORGANIC 08/07/2006    Qualifier: Diagnosis of  By: Haydee Salter    . LUMBAR SPRAIN AND STRAIN 07/19/2008    Qualifier: Diagnosis of  By: Mauricio Po MD, Fayrene Fearing    . Myopia with presbyopia of left eye 02/23/2013  . OBESITY, NOS 08/07/2006    Qualifier: Diagnosis of  By: Haydee Salter    . OSTEOARTHRITIS, LOWER LEG 08/07/2006    Qualifier: Diagnosis of  By: Haydee Salter    . PERIPHERAL EDEMA 08/01/2008    Qualifier: Diagnosis of  By: Sheffield Slider MD, Deniece Portela    . Peripheral vascular disease, unspecified 04/21/2012    Severe tibial occlusive disease (Dr Hart Rochester, Vasc Surgery, 04/2002)     . Recurrent falls 10/04/2013  . Skin ulcer of foot, left heel 02/23/2013  . Type II or unspecified type diabetes mellitus without mention of complication, not stated as uncontrolled 08/07/2006    Qualifier: Diagnosis of  By: Haydee Salter    . Unspecified hereditary and idiopathic peripheral neuropathy 08/07/2006        . Proteinuria 08/07/2006  . Impairment of balance 05/10/2014  . Urge incontinence 05/10/2014  . Impaired mobility and ADLs 05/10/2014  . Diabetes mellitus due to underlying condition with diabetic polyneuropathy 08/07/2006    Qualifier: Diagnosis of  By: Haydee Salter    . Difficulty in voiding 08/22/2014  .  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 Chronic systolic heart failure 08/22/2014  . Mild to Moderate aortic regurgitation 08/22/2014  . Heart failure with reduced ejection fraction 08/22/2014  . Discitis of lumbar region 08/22/2014    Hyperintense signal on Lumbar MRI 08/12/14 in L2-L3 disc.   Past Surgical History  Procedure Laterality Date  . Neck surgery  1993    s/p C1-2, C2-3 anterior cervical diskectomies, partial corpectomies through thoracotomy at [facility] 1993  . Cataract extraction      Cataract surgery   . Thoracotomy  1993    s/p T3-T4 osteomyelitis with multiple open  drainages including thoracotomy at [facility] 1993  . Spinal cord decompression  1993    s/p T1-T2 spinal cord compression secondary to granualtion tissue and abscess material secondary to previous mediastinitis at [facility] 1993  . Jejunostomy feeding tube  1993    s/p Feeding jejunostomy [facility] 1993  . Esophageal perforation  1993    Hx of esophageal perforation complication 1993  . Lumbar epidural injection  2010    Epidural corticosteroid injection for L2-3 & L4-5 severe spinal stenosis at [facility] on 9/10, 10/10, 11/10    . Lumbar laminectomy/decompression microdiscectomy N/A 08/13/2014    Procedure: LUMBAR LAMINECTOMY ;  Surgeon: Shirlean Lejend Dalby, MD;  Location: [facility]UR[facility];  Service: Neurosurgery;  Laterality: N/A;   Family History  Problem Relation Age of Onset  . Diabetes Mother   . Diabetes Father   . Hypertension Father   . Other Father     amputaion  . Diabetes Son   . Hypertension Son    History  Substance Use Topics  . Smoking status: Former Smoker    Types: Cigarettes    Quit date: 04/22/1987  . Smokeless tobacco: Never Used  . Alcohol Use: No    Review of Systems  Constitutional: Negative for fever.  HENT: Negative for congestion.   Respiratory: Negative for cough and shortness of breath.   Cardiovascular: Negative for chest pain.  Gastrointestinal: Negative for vomiting.  All other systems reviewed and are negative.   Allergies  Naprosyn; Rofecoxib; and Tramadol hcl  Home Medications   Prior to Admission medications   Medication Sig Start Date End Date Taking? Authorizing Provider  acetaminophen (TYLENOL) 500 MG tablet Take 500 mg by mouth every 6 (six) hours.    Historical Provider, MD  Ascorbic Acid (VITAMIN C) 500 MG CHEW Chew by mouth daily.      Historical Provider, MD  aspirin 81 MG tablet Take 81 mg by mouth daily.      Historical Provider, MD  calcium-vitamin D (OSCAL WITH D 250-125) 250-125 MG-UNIT per tablet Take 1 tablet by mouth daily.       Historical Provider, MD  cefTRIAXone (ROCEPHIN) 40 MG/ML IVPB Inject 50 mLs (2 g total) into the vein daily. Patient not taking: Reported on 09/02/2014 08/19/14 09/27/14  Pincus Large, DO  Fish Oil OIL Take 1,000 mg by mouth daily.     Historical Provider, MD  food thickener (THICK IT) POWD Take 1 Container by mouth as needed (Per Dsyphagia 3 Diet, to make liquids Nectar Thi\ck). Patient not taking: Reported on 09/02/2014 08/18/14   Pincus Large, DO  gabapentin (NEURONTIN) 300 MG capsule Take 1 capsule (300 mg total) by mouth 2 (two) times daily. 08/18/14   Pincus Large, DO  insulin aspart (NOVOLOG) 100 UNIT/ML injection Inject 3 Units into the skin 3 (three) times daily with meals. 08/18/14   Pincus Large,  DO  insulin glargine (LANTUS) 100 UNIT/ML injection Inject 0.2 mLs (20 Units total) into the skin at bedtime. 08/18/14   Pincus LargeJazma Y Phelps, DO  lisinopril (PRINIVIL,ZESTRIL) 20 MG tablet Take 1 tablet (20 mg total) by mouth at bedtime. 02/09/14   Leighton Roachodd D McDiarmid, MD  magnesium hydroxide (MILK OF MAGNESIA) 400 MG/5ML suspension Take 30 mLs by mouth daily as needed for mild constipation.    Historical Provider, MD  Multiple Vitamin (MULTIVITAMIN) tablet Take 1 tablet by mouth daily.      Historical Provider, MD  oxyCODONE (OXY IR/ROXICODONE) 5 MG immediate release tablet Take 0.5 tablets (2.5 mg total) by mouth every 4 (four) hours as needed for breakthrough pain. 08/30/14   Leighton Roachodd D McDiarmid, MD  Pyridoxine HCl (VITAMIN B-6) 100 MG tablet 3 tabs once daily.     Historical Provider, MD  senna-docusate (SENOKOT-S) 8.6-50 MG per tablet Take 2 tablets by mouth 2 (two) times daily. 08/18/14   Pincus LargeJazma Y Phelps, DO  tamsulosin (FLOMAX) 0.4 MG CAPS capsule Take 0.4 mg by mouth daily.    Historical Provider, MD  vitamin B-12 (CYANOCOBALAMIN) 1000 MCG tablet Take 1,000 mcg by mouth daily.      Historical Provider, MD   BP 101/53 mmHg  Pulse 94  Temp(Src) 98.6 F (37 C) (Oral)  Resp 23  SpO2 100%   Physical  Exam  Constitutional: He is oriented to person, place, and time. He appears well-developed and well-nourished. No distress.  Nontoxic/nonseptic appearing. Pleasant.  HENT:  Head: Normocephalic and atraumatic.  Dry mm  Eyes: Conjunctivae and EOM are normal. No scleral icterus.  Neck: Normal range of motion.  Cardiovascular: Normal rate, regular rhythm and intact distal pulses.   Pulmonary/Chest: Effort normal. No respiratory distress. He has no wheezes. He has no rales.  Decreased breath sounds with mild adventitious sounds in low lobes b/l. No tachypnea or dyspnea. Chest expansion symmetric.  Musculoskeletal: Normal range of motion.  Neurological: He is alert and oriented to person, place, and time. He exhibits normal muscle tone. Coordination normal.  GCS 15. Speech is goal oriented. Patient moves extremities without ataxia.  Skin: Skin is warm and dry. No rash noted. He is not diaphoretic. No erythema. No pallor.  Psychiatric: He has a normal mood and affect. His behavior is normal.  Nursing note and vitals reviewed.   ED Course  Procedures (including critical care time) Labs Review Labs Reviewed  CBC WITH DIFFERENTIAL/PLATELET - Abnormal; Notable for the following:    RBC 3.36 (*)    Hemoglobin 9.0 (*)    HCT 28.7 (*)    RDW 16.8 (*)    Neutrophils Relative % 87 (*)    Lymphocytes Relative 8 (*)    Lymphs Abs 0.4 (*)    All other components within normal limits  BASIC METABOLIC PANEL - Abnormal; Notable for the following:    Glucose, Bld 164 (*)    Calcium 7.7 (*)    GFR calc non Af Amer 56 (*)    GFR calc Af Amer 65 (*)    All other components within normal limits  URINALYSIS, ROUTINE W REFLEX MICROSCOPIC - Abnormal; Notable for the following:    Color, Urine AMBER (*)    APPearance TURBID (*)    Specific Gravity, Urine 1.031 (*)    Hgb urine dipstick LARGE (*)    Ketones, ur 15 (*)    Protein, ur 100 (*)    Leukocytes, UA SMALL (*)    All other components within  normal limits  URINE MICROSCOPIC-ADD ON - Abnormal; Notable for the following:    Bacteria, UA FEW (*)    Casts GRANULAR CAST (*)    All other components within normal limits  CBG MONITORING, ED - Abnormal; Notable for the following:    Glucose-Capillary 154 (*)    All other components within normal limits  URINE CULTURE  I-STAT CG4 LACTIC ACID, ED    Imaging Review Dg Chest 2 View  09/04/2014   CLINICAL DATA:  Fever and congestion.  EXAM: CHEST  2 VIEW  COMPARISON:  08/17/2014  FINDINGS: Right upper extremity PICC in the SVC. Heart is at the upper limits normal in size. Minimal linear atelectasis at both lung bases. Pulmonary vasculature is normal. No consolidation, pleural effusion, or pneumothorax. No acute osseous abnormalities are seen.  IMPRESSION: Minimal bibasilar atelectasis.   Electronically Signed   By: Rubye Oaks M.D.   On: 09/04/2014 04:45     EKG Interpretation None      MDM   Final diagnoses:  Normal physical examination    81 year old male presents to the emergency department via EMS for further evaluation of supposedly fever and congestion. Facility expressed concern over pneumonia. Patient was discharged from the hospital after cystoscopy with clot evacuation on 09/02/14. Patient is afebrile over ED course. He is hemodynamically stable. He has no leukocytosis to suggest infection and no evidence of focal consolidation or pneumonia on chest x-ray. Labs today c/w baseline. Do not believe further emergent workup is indicated and the patient is stable for discharge back to [place] facility. PCP f/u advised and return precautions given. Patient seen also by Dr. Lynelle Doctor who is in agreement with this workup, assessment, management plan, and patient's stability for discharge.   Filed Vitals:   09/04/14 0335 09/04/14 0500 09/04/14 0530 09/04/14 0545  BP: 107/57 102/52 101/53   Pulse: 94 94    Temp: 98.6 F (37 C)     TempSrc: Oral     Resp: SpO2: 100%  100%  100%     Antony Madura, PA-C 09/04/14 1610  Devoria Albe, MD 09/04/14 [phone]

## 2014-09-04 NOTE — Discharge Instructions (Signed)
Upper Respiratory Infection, Adult An upper respiratory infection (URI) is also sometimes known as the common cold. The upper respiratory tract includes the nose, sinuses, throat, trachea, and bronchi. Bronchi are the airways leading to the lungs. Most people improve within 1 week, but symptoms can last up to 2 weeks. A residual cough may last even longer.  CAUSES Many different viruses can infect the tissues lining the upper respiratory tract. The tissues become irritated and inflamed and often become very moist. Mucus production is also common. A cold is contagious. You can easily spread the virus to others by oral contact. This includes kissing, sharing a glass, coughing, or sneezing. Touching your mouth or nose and then touching a surface, which is then touched by another person, can also spread the virus. SYMPTOMS  Symptoms typically develop 1 to 3 days after you come in contact with a cold virus. Symptoms vary from person to person. They may include:  Runny nose.  Sneezing.  Nasal congestion.  Sinus irritation.  Sore throat.  Loss of voice (laryngitis).  Cough.  Fatigue.  Muscle aches.  Loss of appetite.  Headache.  Low-grade fever. DIAGNOSIS  You might diagnose your own cold based on familiar symptoms, since most people get a cold 2 to 3 times a year. Your caregiver can confirm this based on your exam. Most importantly, your caregiver can check that your symptoms are not due to another disease such as strep throat, sinusitis, pneumonia, asthma, or epiglottitis. Blood tests, throat tests, and X-rays are not necessary to diagnose a common cold, but they may sometimes be helpful in excluding other more serious diseases. Your caregiver will decide if any further tests are required. RISKS AND COMPLICATIONS  You may be at risk for a more severe case of the common cold if you smoke cigarettes, have chronic heart disease (such as heart failure) or lung disease (such as asthma), or if  you have a weakened immune system. The very young and very old are also at risk for more serious infections. Bacterial sinusitis, middle ear infections, and bacterial pneumonia can complicate the common cold. The common cold can worsen asthma and chronic obstructive pulmonary disease (COPD). Sometimes, these complications can require emergency medical care and may be life-threatening. PREVENTION  The best way to protect against getting a cold is to practice good hygiene. Avoid oral or hand contact with people with cold symptoms. Wash your hands often if contact occurs. There is no clear evidence that vitamin C, vitamin E, echinacea, or exercise reduces the chance of developing a cold. However, it is always recommended to get plenty of rest and practice good nutrition. TREATMENT  Treatment is directed at relieving symptoms. There is no cure. Antibiotics are not effective, because the infection is caused by a virus, not by bacteria. Treatment may include:  Increased fluid intake. Sports drinks offer valuable electrolytes, sugars, and fluids.  Breathing heated mist or steam (vaporizer or shower).  Eating chicken soup or other clear broths, and maintaining good nutrition.  Getting plenty of rest.  Using gargles or lozenges for comfort.  Controlling fevers with ibuprofen or acetaminophen as directed by your caregiver.  Increasing usage of your inhaler if you have asthma. Zinc gel and zinc lozenges, taken in the first 24 hours of the common cold, can shorten the duration and lessen the severity of symptoms. Pain medicines may help with fever, muscle aches, and throat pain. A variety of non-prescription medicines are available to treat congestion and runny nose. Your caregiver   can make recommendations and may suggest nasal or lung inhalers for other symptoms.  HOME CARE INSTRUCTIONS   Only take over-the-counter or prescription medicines for pain, discomfort, or fever as directed by your  caregiver.  Use a warm mist humidifier or inhale steam from a shower to increase air moisture. This may keep secretions moist and make it easier to breathe.  Drink enough water and fluids to keep your urine clear or pale yellow.  Rest as needed.  Return to work when your temperature has returned to normal or as your caregiver advises. You may need to stay home longer to avoid infecting others. You can also use a face mask and careful hand washing to prevent spread of the virus. SEEK MEDICAL CARE IF:   After the first few days, you feel you are getting worse rather than better.  You need your caregiver's advice about medicines to control symptoms.  You develop chills, worsening shortness of breath, or brown or red sputum. These may be signs of pneumonia.  You develop yellow or brown nasal discharge or pain in the face, especially when you bend forward. These may be signs of sinusitis.  You develop a fever, swollen neck glands, pain with swallowing, or white areas in the back of your throat. These may be signs of strep throat. SEEK IMMEDIATE MEDICAL CARE IF:   You have a fever.  You develop severe or persistent headache, ear pain, sinus pain, or chest pain.  You develop wheezing, a prolonged cough, cough up blood, or have a change in your usual mucus (if you have chronic lung disease).  You develop sore muscles or a stiff neck. Document Released: 11/20/2000 Document Revised: 08/19/2011 Document Reviewed: 09/01/2013 ExitCare Patient Information 2015 [employer]. This information is not intended to replace advice given to you by your health care provider. Make sure you discuss any questions you have with your health care provider.  

## 2014-09-04 NOTE — ED Notes (Signed)
Patient resides at [facility].  He has had congestion and fever for the past day.  Patient is alert.  Denies any pain.  Patient with no s/sx of distress.

## 2014-09-04 NOTE — ED Notes (Signed)
Call [facility]/ remember DNR

## 2014-09-04 NOTE — ED Notes (Signed)
Report called to [facility] regarding patient d/c.  Patient to be transported via [facility].

## 2014-09-04 NOTE — ED Notes (Signed)
Culture add on to original urine specimen

## 2014-09-05 ENCOUNTER — Encounter (HOSPITAL_COMMUNITY): Payer: Self-pay | Admitting: Urology

## 2014-09-06 LAB — URINE CULTURE: Colony Count: >=100000

## 2014-09-08 ENCOUNTER — Other Ambulatory Visit: Payer: Self-pay | Admitting: Family Medicine

## 2014-09-08 DIAGNOSIS — Z9889 Other specified postprocedural states: Secondary | ICD-10-CM

## 2014-09-08 MED ORDER — OXYCODONE HCL 5 MG PO TABS: 2.5000 mg | ORAL_TABLET | ORAL | Status: AC | PRN

## 2014-09-09 ENCOUNTER — Other Ambulatory Visit (HOSPITAL_COMMUNITY): Payer: Self-pay

## 2014-09-09 ENCOUNTER — Encounter (HOSPITAL_COMMUNITY): Payer: Self-pay | Admitting: *Deleted

## 2014-09-09 ENCOUNTER — Telehealth: Payer: Self-pay | Admitting: Family Medicine

## 2014-09-09 ENCOUNTER — Other Ambulatory Visit: Payer: Self-pay

## 2014-09-09 ENCOUNTER — Non-Acute Institutional Stay (INDEPENDENT_AMBULATORY_CARE_PROVIDER_SITE_OTHER): Payer: Medicare Other | Admitting: Family Medicine

## 2014-09-09 ENCOUNTER — Emergency Department (HOSPITAL_COMMUNITY)
Admission: EM | Admit: 2014-09-09 | Discharge: 2014-09-09 | Disposition: A | Discharge status: Home / Self Care | Payer: Medicare Other | Attending: Emergency Medicine | Admitting: Emergency Medicine

## 2014-09-09 ENCOUNTER — Emergency Department (HOSPITAL_COMMUNITY): Payer: Medicare Other

## 2014-09-09 DIAGNOSIS — Z8719 Personal history of other diseases of the digestive system: Secondary | ICD-10-CM | POA: Insufficient documentation

## 2014-09-09 DIAGNOSIS — R0602 Shortness of breath: Secondary | ICD-10-CM | POA: Diagnosis present

## 2014-09-09 DIAGNOSIS — N390 Urinary tract infection, site not specified: Secondary | ICD-10-CM | POA: Diagnosis not present

## 2014-09-09 DIAGNOSIS — Z9181 History of falling: Secondary | ICD-10-CM | POA: Insufficient documentation

## 2014-09-09 DIAGNOSIS — E119 Type 2 diabetes mellitus without complications: Secondary | ICD-10-CM | POA: Insufficient documentation

## 2014-09-09 DIAGNOSIS — Z862 Personal history of diseases of the blood and blood-forming organs and certain disorders involving the immune mechanism: Secondary | ICD-10-CM | POA: Insufficient documentation

## 2014-09-09 DIAGNOSIS — J209 Acute bronchitis, unspecified: Secondary | ICD-10-CM | POA: Diagnosis not present

## 2014-09-09 DIAGNOSIS — Z87891 Personal history of nicotine dependence: Secondary | ICD-10-CM | POA: Insufficient documentation

## 2014-09-09 DIAGNOSIS — M1990 Unspecified osteoarthritis, unspecified site: Secondary | ICD-10-CM | POA: Diagnosis not present

## 2014-09-09 DIAGNOSIS — Z872 Personal history of diseases of the skin and subcutaneous tissue: Secondary | ICD-10-CM | POA: Diagnosis not present

## 2014-09-09 DIAGNOSIS — E669 Obesity, unspecified: Secondary | ICD-10-CM | POA: Diagnosis not present

## 2014-09-09 DIAGNOSIS — E785 Hyperlipidemia, unspecified: Secondary | ICD-10-CM | POA: Diagnosis not present

## 2014-09-09 DIAGNOSIS — Z794 Long term (current) use of insulin: Secondary | ICD-10-CM | POA: Diagnosis not present

## 2014-09-09 DIAGNOSIS — Z87828 Personal history of other (healed) physical injury and trauma: Secondary | ICD-10-CM | POA: Insufficient documentation

## 2014-09-09 DIAGNOSIS — I5022 Chronic systolic (congestive) heart failure: Secondary | ICD-10-CM | POA: Insufficient documentation

## 2014-09-09 DIAGNOSIS — Z792 Long term (current) use of antibiotics: Secondary | ICD-10-CM | POA: Diagnosis not present

## 2014-09-09 DIAGNOSIS — Z7982 Long term (current) use of aspirin: Secondary | ICD-10-CM | POA: Diagnosis not present

## 2014-09-09 DIAGNOSIS — I10 Essential (primary) hypertension: Secondary | ICD-10-CM | POA: Insufficient documentation

## 2014-09-09 DIAGNOSIS — R509 Fever, unspecified: Secondary | ICD-10-CM

## 2014-09-09 DIAGNOSIS — G609 Hereditary and idiopathic neuropathy, unspecified: Secondary | ICD-10-CM | POA: Diagnosis not present

## 2014-09-09 DIAGNOSIS — Z79899 Other long term (current) drug therapy: Secondary | ICD-10-CM | POA: Diagnosis not present

## 2014-09-09 LAB — URINALYSIS, ROUTINE W REFLEX MICROSCOPIC
Bilirubin Urine: NEGATIVE
GLUCOSE, UA: NEGATIVE mg/dL
KETONES UR: NEGATIVE mg/dL
NITRITE: NEGATIVE
Protein, ur: 100 mg/dL — AB
Specific Gravity, Urine: 1.022 (ref 1.005–1.030)
UROBILINOGEN UA: 0.2 mg/dL (ref 0.0–1.0)
pH: 6 (ref 5.0–8.0)

## 2014-09-09 LAB — COMPREHENSIVE METABOLIC PANEL
ALT: 23 U/L (ref 0–53)
AST: 40 U/L — AB (ref 0–37)
Albumin: 2.2 g/dL — ABNORMAL LOW (ref 3.5–5.2)
Alkaline Phosphatase: 67 U/L (ref 39–117)
Anion gap: 11 (ref 5–15)
BUN: 18 mg/dL (ref 6–23)
CALCIUM: 8.2 mg/dL — AB (ref 8.4–10.5)
CO2: 26 mmol/L (ref 19–32)
CREATININE: 1.07 mg/dL (ref 0.50–1.35)
Chloride: 110 mmol/L (ref 96–112)
GFR calc Af Amer: 74 mL/min — ABNORMAL LOW (ref >90)
GFR, EST NON AFRICAN AMERICAN: 64 mL/min — AB (ref >90)
Glucose, Bld: 97 mg/dL (ref 70–99)
POTASSIUM: 3.7 mmol/L (ref 3.5–5.1)
SODIUM: 147 mmol/L — AB (ref 135–145)
Total Bilirubin: 0.7 mg/dL (ref 0.3–1.2)
Total Protein: 6.6 g/dL (ref 6.0–8.3)

## 2014-09-09 LAB — CBC WITH DIFFERENTIAL/PLATELET
BASOS PCT: 0 % (ref 0–1)
Basophils Absolute: 0 10*3/uL (ref 0.0–0.1)
Eosinophils Absolute: 0 10*3/uL (ref 0.0–0.7)
Eosinophils Relative: 0 % (ref 0–5)
HEMATOCRIT: 28.9 % — AB (ref 39.0–52.0)
HEMOGLOBIN: 9.3 g/dL — AB (ref 13.0–17.0)
Lymphocytes Relative: 8 % — ABNORMAL LOW (ref 12–46)
Lymphs Abs: 0.8 10*3/uL (ref 0.7–4.0)
MCH: 27.3 pg (ref 26.0–34.0)
MCHC: 32.2 g/dL (ref 30.0–36.0)
MCV: 84.8 fL (ref 78.0–100.0)
MONOS PCT: 6 % (ref 3–12)
Monocytes Absolute: 0.6 10*3/uL (ref 0.1–1.0)
NEUTROS ABS: 8.8 10*3/uL — AB (ref 1.7–7.7)
Neutrophils Relative %: 86 % — ABNORMAL HIGH (ref 43–77)
Platelets: 237 10*3/uL (ref 150–400)
RBC: 3.41 MIL/uL — AB (ref 4.22–5.81)
RDW: 17 % — ABNORMAL HIGH (ref 11.5–15.5)
WBC: 10.2 10*3/uL (ref 4.0–10.5)

## 2014-09-09 LAB — URINE MICROSCOPIC-ADD ON

## 2014-09-09 LAB — I-STAT CG4 LACTIC ACID, ED
Lactic Acid, Venous: 0.91 mmol/L (ref 0.5–2.0)
Lactic Acid, Venous: 1.9 mmol/L (ref 0.5–2.0)

## 2014-09-09 MED ORDER — DEXTROSE 5 % IV SOLN
2.0000 g | Freq: Once | INTRAVENOUS | Status: AC
  Administered 2014-09-09: 2 g via INTRAVENOUS
  Filled 2014-09-09: qty 2

## 2014-09-09 MED ORDER — ALBUTEROL SULFATE (2.5 MG/3ML) 0.083% IN NEBU
5.0000 mg | INHALATION_SOLUTION | Freq: Once | RESPIRATORY_TRACT | Status: AC
  Administered 2014-09-09: 5 mg via RESPIRATORY_TRACT
  Filled 2014-09-09: qty 6

## 2014-09-09 MED ORDER — ACETAMINOPHEN 325 MG PO TABS
650.0000 mg | ORAL_TABLET | Freq: Once | ORAL | Status: DC
  Filled 2014-09-09: qty 2

## 2014-09-09 MED ORDER — SODIUM CHLORIDE 0.9 % IV BOLUS (SEPSIS)
500.0000 mL | INTRAVENOUS | Status: AC
  Administered 2014-09-09: 500 mL via INTRAVENOUS

## 2014-09-09 MED ORDER — LEVOFLOXACIN 750 MG PO TABS: 750.0000 mg | ORAL_TABLET | Freq: Every day | ORAL | Status: DC

## 2014-09-09 MED ORDER — VANCOMYCIN HCL IN DEXTROSE 1-5 GM/200ML-% IV SOLN
1000.0000 mg | Freq: Once | INTRAVENOUS | Status: AC
  Administered 2014-09-09: 1000 mg via INTRAVENOUS
  Filled 2014-09-09: qty 200

## 2014-09-09 MED ORDER — SODIUM CHLORIDE 0.9 % IV BOLUS (SEPSIS)
1000.0000 mL | INTRAVENOUS | Status: AC
  Administered 2014-09-09 (×2): 1000 mL via INTRAVENOUS

## 2014-09-09 MED ORDER — HEPARIN SOD (PORK) LOCK FLUSH 100 UNIT/ML IV SOLN
500.0000 [IU] | Freq: Once | INTRAVENOUS | Status: AC
  Administered 2014-09-09: 500 [IU]
  Filled 2014-09-09: qty 5

## 2014-09-09 MED ORDER — IPRATROPIUM BROMIDE 0.02 % IN SOLN
0.5000 mg | Freq: Once | RESPIRATORY_TRACT | Status: AC
  Administered 2014-09-09: 0.5 mg via RESPIRATORY_TRACT
  Filled 2014-09-09: qty 2.5

## 2014-09-09 NOTE — ED Notes (Signed)
Bed: [id number] Expected date:  Expected time:  Means of arrival:  Comments: Ems pna

## 2014-09-09 NOTE — Progress Notes (Signed)
CSW met with pt at bedside. There was no family present. Per note,patient is from [place]. Per note, nursing home staff states that the pt has a had a cough and SOB which, started today. Also, note states that the pt has a hx of dementia.  Patient was extremely difficult to understand during the interview. However, pt states that he does not have any family and friends in [place] but he states that he does have people who visit. Pt also states that he has not fallen within the past  6 months. CSW states that he does receive assistance with ADL's while a t the facility.  Willette Brace [phone] ED CSW 09/09/2014 09/09/2014 7:23 PM

## 2014-09-09 NOTE — ED Notes (Addendum)
Per EMS, pt is from [facility]. Nursing home staff state the pt has had cough, shortness of breath starting today. Nursing home performed x-ray, which showed right sided pneumonia. Staff state pt had fever of 102 this morning, which dropped down to 99.. Pt has hx of dementia

## 2014-09-09 NOTE — Progress Notes (Signed)
EDCM spoke to patient at bedside.  Patient is currently resident at [facility] facility.  Patient has been recently admitted and discharged from 03/04 to 03/10.  Physician listed at nursing facility Dr. Tawanna Coolerodd Mcdiarmid.

## 2014-09-09 NOTE — Telephone Encounter (Signed)
Called by Rene Kocheregina, RN, that Carlos Andrews's CXR shows RLL infiltrate on CXR, requiring albuterol tx, and still febrile.  Recommend further evaluation in the ED as he may need IV ABx for aspiration/HCAP.    Twana FirstBryan R. Paulina FusiHess, DO of [facility] 09/09/2014, 1:55 PM

## 2014-09-09 NOTE — ED Provider Notes (Addendum)
CSN: [id number]     Arrival date & time 09/09/14  1510 History   First MD Initiated Contact with Patient 09/09/14 1546     Chief Complaint  Patient presents with  . Shortness of Breath  . Cough     (Consider location/radiation/quality/duration/timing/severity/associated sxs/prior Treatment) Patient is a 81 y.o. male presenting with shortness of breath and cough. The history is provided by the patient.  Shortness of Breath Associated symptoms: cough   Cough Associated symptoms: shortness of breath   Carlos Andrews is a 81 y.o. male who presents for evaluation of known pneumonia. This was apparently discovered today at his nursing home. It was unknown if he is on treatment for it yet. He was recently treated for urinary tract infection.  Level V Caveat- confusion  Past Medical History  Diagnosis Date  . DIABETES MELLITUS, II, COMPLICATIONS 08/07/2006  . Esophageal perforation 1993    managed at [facility]  . PPD positive 1993    Found at [facility]  . Spinal stenosis of lumbar region at multiple levels   . Proteinuria 08/07/2006    Qualifier: Diagnosis of  By: Haydee Salter    . Open angle with borderline findings and low glaucoma risk in both eyes 02/23/2013  . Regular astigmatism 02/23/2013  . Presbyopia 02/23/2013  . Ptosis of left eyelid 02/23/2013  . At risk for falling 10/04/2013  . GAIT DISTURBANCE 08/01/2008        . HYPERLIPIDEMIA 08/07/2006    Qualifier: Diagnosis of  By: Haydee Salter    . HYPERTENSION, BENIGN SYSTEMIC 08/07/2006    Qualifier: Diagnosis of  By: Haydee Salter    . IMPOTENCE, ORGANIC 08/07/2006    Qualifier: Diagnosis of  By: Haydee Salter    . LUMBAR SPRAIN AND STRAIN 07/19/2008    Qualifier: Diagnosis of  By: Mauricio Po MD, Fayrene Fearing    . Myopia with presbyopia of left eye 02/23/2013  . OBESITY, NOS 08/07/2006    Qualifier: Diagnosis of  By: Haydee Salter    . OSTEOARTHRITIS, LOWER LEG 08/07/2006    Qualifier: Diagnosis of  By: Haydee Salter    . PERIPHERAL EDEMA  08/01/2008    Qualifier: Diagnosis of  By: Sheffield Slider MD, Deniece Portela    . Peripheral vascular disease, unspecified 04/21/2012    Severe tibial occlusive disease (Dr Hart Rochester, Vasc Surgery, 04/2002)     . Recurrent falls 10/04/2013  . Skin ulcer of foot, left heel 02/23/2013  . Type II or unspecified type diabetes mellitus without mention of complication, not stated as uncontrolled 08/07/2006    Qualifier: Diagnosis of  By: Haydee Salter    . Unspecified hereditary and idiopathic peripheral neuropathy 08/07/2006        . Proteinuria 08/07/2006  . Impairment of balance 05/10/2014  . Urge incontinence 05/10/2014  . Impaired mobility and ADLs 05/10/2014  . Diabetes mellitus due to underlying condition with diabetic polyneuropathy 08/07/2006    Qualifier: Diagnosis of  By: Haydee Salter    . Difficulty in voiding 08/22/2014  . 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 Chronic systolic heart failure 08/22/2014  . Mild to Moderate aortic regurgitation 08/22/2014  . Heart failure with reduced ejection fraction 08/22/2014  . Discitis of lumbar region 08/22/2014    Hyperintense signal on Lumbar MRI 08/12/14 in L2-L3 disc.   Past Surgical History  Procedure Laterality Date  . Neck surgery  1993    s/p C1-2, C2-3 anterior cervical diskectomies, partial corpectomies through thoracotomy at [facility] 1993  . Cataract extraction  Cataract surgery   . Thoracotomy  1993    s/p T3-T4 osteomyelitis with multiple open drainages including thoracotomy at [facility] 1993  . Spinal cord decompression  1993    s/p T1-T2 spinal cord compression secondary to granualtion tissue and abscess material secondary to previous mediastinitis at [facility] 1993  . Jejunostomy feeding tube  1993    s/p Feeding jejunostomy [facility] 1993  . Esophageal perforation  1993    Hx of esophageal perforation complication 1993  . Lumbar epidural injection  2010    Epidural corticosteroid injection for L2-3 & L4-5 severe spinal stenosis at [facility] on 9/10, 10/10,  11/10    . Lumbar laminectomy/decompression microdiscectomy N/A 08/13/2014    Procedure: LUMBAR LAMINECTOMY ;  Surgeon: Shirlean Kelly, MD;  Location: [facility]UR[facility] ORS;  Service: Neurosurgery;  Laterality: N/A;  . Cysto N/A 09/02/2014    Procedure: CYSTO, clot evacuation;  Surgeon: Sebastian Ache, MD;  Location: [facility] ORS;  Service: Urology;  Laterality: N/A;   Family History  Problem Relation Age of Onset  . Diabetes Mother   . Diabetes Father   . Hypertension Father   . Other Father     amputaion  . Diabetes Son   . Hypertension Son    History  Substance Use Topics  . Smoking status: Former Smoker    Types: Cigarettes    Quit date: 04/22/1987  . Smokeless tobacco: Never Used  . Alcohol Use: No    Review of Systems  Respiratory: Positive for cough and shortness of breath.       Allergies  Naprosyn; Rofecoxib; and Tramadol hcl  Home Medications   Prior to Admission medications   Medication Sig Start Date End Date Taking? Authorizing Provider  acetaminophen (TYLENOL) 500 MG tablet Take 500 mg by mouth every 6 (six) hours.   Yes Historical Provider, MD  Ascorbic Acid (VITAMIN C) 500 MG CHEW Chew by mouth daily.     Yes Historical Provider, MD  aspirin 81 MG tablet Take 81 mg by mouth daily.     Yes Historical Provider, MD  bisacodyl (DULCOLAX) 10 MG suppository Place 10 mg rectally as needed for moderate constipation. For constipation unrelieved by milk of magnesia.   Yes Historical Provider, MD  calcium-vitamin D (OSCAL WITH D 250-125) 250-125 MG-UNIT per tablet Take 1 tablet by mouth daily.     Yes Historical Provider, MD  cefTRIAXone (ROCEPHIN) 40 MG/ML IVPB Inject 50 mLs (2 g total) into the vein daily. 08/19/14 09/27/14 Yes Pincus Large, DO  Fish Oil OIL Take 1,000 mg by mouth daily.    Yes Historical Provider, MD  gabapentin (NEURONTIN) 300 MG capsule Take 1 capsule (300 mg total) by mouth 2 (two) times daily. 08/18/14  Yes Pincus Large, DO  insulin aspart (NOVOLOG) 100  UNIT/ML injection Inject 3 Units into the skin 3 (three) times daily with meals. 08/18/14  Yes Pincus Large, DO  insulin glargine (LANTUS) 100 UNIT/ML injection Inject 0.2 mLs (20 Units total) into the skin at bedtime. 08/18/14  Yes Pincus Large, DO  lisinopril (PRINIVIL,ZESTRIL) 20 MG tablet Take 1 tablet (20 mg total) by mouth at bedtime. 02/09/14  Yes Leighton Roach McDiarmid, MD  magnesium hydroxide (MILK OF MAGNESIA) 400 MG/5ML suspension Take 30 mLs by mouth daily as needed for mild constipation.   Yes Historical Provider, MD  Multiple Vitamin (MULTIVITAMIN WITH MINERALS) TABS tablet Take 1 tablet by mouth daily.   Yes Historical Provider, MD  nutrition supplement, JUVEN, (JUVEN)  PACK Take 1 packet by mouth 2 (two) times daily. With 240 ml juice.   Yes Historical Provider, MD  Nutritional Supplements (NUTRITIONAL SHAKE) LIQD Take 120 mLs by mouth 2 (two) times daily. Med Pass.   Yes Historical Provider, MD  oxyCODONE (OXY IR/ROXICODONE) 5 MG immediate release tablet Take 0.5 tablets (2.5 mg total) by mouth every 4 (four) hours as needed for breakthrough pain. 09/08/14  Yes Leighton Roachodd D McDiarmid, MD  oxyCODONE (OXY IR/ROXICODONE) 5 MG immediate release tablet Take 5 mg by mouth 4 (four) times daily.   Yes Historical Provider, MD  Pyridoxine HCl (VITAMIN B-6) 100 MG tablet 3 tabs once daily.    Yes Historical Provider, MD  senna-docusate (SENOKOT-S) 8.6-50 MG per tablet Take 2 tablets by mouth 2 (two) times daily. 08/18/14  Yes Pincus LargeJazma Y Phelps, DO  Sodium Phosphates (RA SALINE ENEMA RE) Place 1 enema rectally once as needed (for constipation unrelieved by bisacodyl suppositories. Contact MD if no results.).   Yes Historical Provider, MD  tamsulosin (FLOMAX) 0.4 MG CAPS capsule Take 0.4 mg by mouth daily.   Yes Historical Provider, MD  vitamin B-12 (CYANOCOBALAMIN) 1000 MCG tablet Take 1,000 mcg by mouth daily.     Yes Historical Provider, MD  food thickener (THICK IT) POWD Take 1 Container by mouth as needed (Per  Dsyphagia 3 Diet, to make liquids Nectar Thi\ck). Patient not taking: Reported on 09/02/2014 08/18/14   Pincus LargeJazma Y Phelps, DO  levofloxacin (LEVAQUIN) 750 MG tablet Take 1 tablet (750 mg total) by mouth daily. X 7 days 09/09/14   Mancel BaleElliott Pierre Cumpton, MD  Multiple Vitamin (MULTIVITAMIN) tablet Take 1 tablet by mouth daily.      Historical Provider, MD   BP 152/86 mmHg  Pulse 106  Temp(Src) 101.4 F (38.6 C) (Rectal)  Resp 23  Ht 5\' 5"  (1.651 m)  Wt 165 lb (74.844 kg)  BMI 27.46 kg/m2  SpO2 96% Physical Exam  Constitutional: He appears well-developed. No distress.  Elderly, frail  HENT:  Head: Normocephalic and atraumatic.  Right Ear: External ear normal.  Left Ear: External ear normal.  Eyes: Conjunctivae and EOM are normal. Pupils are equal, round, and reactive to light.  Neck: Normal range of motion and phonation normal. Neck supple.  Cardiovascular: Normal rate, regular rhythm and normal heart sounds.   Pulmonary/Chest: Effort normal. No respiratory distress. He exhibits no bony tenderness.  Scattered rhonchi, no rales  Abdominal: Soft. There is no tenderness.  Musculoskeletal: Normal range of motion.  Neurological: He is alert. No cranial nerve deficit or sensory deficit. He exhibits normal muscle tone. Coordination normal.  Skin: Skin is warm, dry and intact.  Psychiatric: His behavior is normal.  Nursing note and vitals reviewed.   ED Course  Procedures (including critical care time)  Medications  sodium chloride 0.9 % bolus 1,000 mL (0 mLs Intravenous Stopped 09/09/14 1929)    Followed by  sodium chloride 0.9 % bolus 500 mL (500 mLs Intravenous New Bag/Given 09/09/14 1937)  acetaminophen (TYLENOL) tablet 650 mg (not administered)  ceFEPIme (MAXIPIME) 2 g in dextrose 5 % 50 mL IVPB (0 g Intravenous Stopped 09/09/14 1651)  vancomycin (VANCOCIN) IVPB 1000 mg/200 mL premix (0 mg Intravenous Stopped 09/09/14 1747)  albuterol (PROVENTIL) (2.5 MG/3ML) 0.083% nebulizer solution 5 mg (5 mg  Nebulization Given 09/09/14 1808)  ipratropium (ATROVENT) nebulizer solution 0.5 mg (0.5 mg Nebulization Given 09/09/14 1808)    Patient Vitals for the past 24 hrs:  BP Temp Temp src Pulse Resp SpO2  Height Weight  09/09/14 2007 152/86 mmHg 101.4 F (38.6 C) Rectal 106 23 96 % - -  09/09/14 1930 156/81 mmHg - - - 21 - - -  09/09/14 1921 139/69 mmHg - Oral 119 19 100 % - -  09/09/14 1845 - - - (!) 121 (!) 33 98 % - -  09/09/14 1810 - - - - - 97 % - -  09/09/14 1745 - - - - 23 - - -  09/09/14 1730 140/98 mmHg - - (!) 223 24 (!) 87 % - -  09/09/14 1645 (!) 119/102 mmHg - - 112 25 100 % - -  09/09/14 1608 - - - - - - 5\' 5"  (1.651 m) 165 lb (74.844 kg)  09/09/14 1514 112/65 mmHg 98.4 F (36.9 C) Oral 99 20 99 % - -    Discussed case with the on-call family medicine physician, he will arrange follow-up in the next 1 or 2 days, at the patient's facility.  8:21 PM Reevaluation with update and discussion. After initial assessment and treatment, an updated evaluation reveals he remains comfortable and alert, but is still confused.Mancel Bale L    Labs Review Labs Reviewed  CBC WITH DIFFERENTIAL/PLATELET - Abnormal; Notable for the following:    RBC 3.41 (*)    Hemoglobin 9.3 (*)    HCT 28.9 (*)    RDW 17.0 (*)    Neutrophils Relative % 86 (*)    Neutro Abs 8.8 (*)    Lymphocytes Relative 8 (*)    All other components within normal limits  COMPREHENSIVE METABOLIC PANEL - Abnormal; Notable for the following:    Sodium 147 (*)    Calcium 8.2 (*)    Albumin 2.2 (*)    AST 40 (*)    GFR calc non Af Amer 64 (*)    GFR calc Af Amer 74 (*)    All other components within normal limits  URINALYSIS, ROUTINE W REFLEX MICROSCOPIC - Abnormal; Notable for the following:    APPearance CLOUDY (*)    Hgb urine dipstick MODERATE (*)    Protein, ur 100 (*)    Leukocytes, UA SMALL (*)    All other components within normal limits  URINE MICROSCOPIC-ADD ON - Abnormal; Notable for the following:     Bacteria, UA MANY (*)    Casts HYALINE CASTS (*)    All other components within normal limits  CULTURE, BLOOD (ROUTINE X 2)  CULTURE, BLOOD (ROUTINE X 2)  URINE CULTURE  I-STAT CG4 LACTIC ACID, ED  I-STAT CG4 LACTIC ACID, ED    Imaging Review Dg Chest 2 View  09/09/2014   CLINICAL DATA:  Shortness of breath. History of diabetes, positive PPD and gait disturbance.  EXAM: CHEST  2 VIEW  COMPARISON:  09/09/2014 and 09/04/2014.  FINDINGS: The heart size and mediastinal contours are stable. Coronary artery calcifications noted. Right arm PICC extends into the superior aspect of the right atrium. Patchy bibasilar opacities are most consistent with atelectasis. There is no consolidation or obscuration of the hemidiaphragm. There is no significant pleural effusion. The bones appear unremarkable.  IMPRESSION: Stable cardiomegaly and patchy bibasilar atelectasis. No evidence of pneumonia.   Electronically Signed   By: Carey Bullocks M.D.   On: 09/09/2014 18:17   Dg Chest Port 1 View  09/09/2014   CLINICAL DATA:  Shortness of breath, chest pain, cough.  EXAM: PORTABLE CHEST - 1 VIEW  COMPARISON:  08/17/2014  FINDINGS: Mild cardiomegaly which is stable from previous.  Stable mediastinal contours with mild aortic tortuosity. There is a right upper extremity PICC, tip at the upper cavoatrial junction.  Coarsening of lung markings, fairly symmetric and basilar predominant. Trace right pleural effusion. No edema or pneumothorax.  IMPRESSION: 1. Increased lower lung markings is likely technical. If concern for pneumonia, two view imaging recommended. 2. Trace right pleural effusion.   Electronically Signed   By: Marnee Spring M.D.   On: 09/09/2014 16:19     EKG Interpretation None      MDM   Final diagnoses:  UTI (lower urinary tract infection)  Acute bronchitis, unspecified organism    Febrile illness, with UTI and bronchitis. Doubt pneumonia, serious sepsis, metabolic instability or impending vascular  collapse. Patient is stable for discharge and treatment in the outpatient setting.   Nursing Notes Reviewed/ Care Coordinated Applicable Imaging Reviewed Interpretation of Laboratory Data incorporated into ED treatment  The patient appears reasonably screened and/or stabilized for discharge and I doubt any other medical condition or other [facility] requiring further screening, evaluation, or treatment in the ED at this time prior to discharge.  Plan: Home Medications- Levaquin; Home Treatments- rest, push fluids; return here if the recommended treatment, does not improve the symptoms; Recommended follow up- PCP 1 day follow up   Mancel Bale, MD 09/09/14 1610  Mancel Bale, MD 09/20/14 1354

## 2014-09-09 NOTE — Discharge Instructions (Signed)
Acute Bronchitis °Bronchitis is inflammation of the airways that extend from the windpipe into the lungs (bronchi). The inflammation often causes mucus to develop. This leads to a cough, which is the most common symptom of bronchitis.  °In acute bronchitis, the condition usually develops suddenly and goes away over time, usually in a couple weeks. Smoking, allergies, and asthma can make bronchitis worse. Repeated episodes of bronchitis may cause further lung problems.  °CAUSES °Acute bronchitis is most often caused by the same virus that causes a cold. The virus can spread from person to person (contagious) through coughing, sneezing, and touching contaminated objects. °SIGNS AND SYMPTOMS  °· Cough.   °· Fever.   °· Coughing up mucus.   °· Body aches.   °· Chest congestion.   °· Chills.   °· Shortness of breath.   °· Sore throat.   °DIAGNOSIS  °Acute bronchitis is usually diagnosed through a physical exam. Your health care provider will also ask you questions about your medical history. Tests, such as chest X-rays, are sometimes done to rule out other conditions.  °TREATMENT  °Acute bronchitis usually goes away in a couple weeks. Oftentimes, no medical treatment is necessary. Medicines are sometimes given for relief of fever or cough. Antibiotic medicines are usually not needed but may be prescribed in certain situations. In some cases, an inhaler may be recommended to help reduce shortness of breath and control the cough. A cool mist vaporizer may also be used to help thin bronchial secretions and make it easier to clear the chest.  °HOME CARE INSTRUCTIONS °· Get plenty of rest.   °· Drink enough fluids to keep your urine clear or pale yellow (unless you have a medical condition that requires fluid restriction). Increasing fluids may help thin your respiratory secretions (sputum) and reduce chest congestion, and it will prevent dehydration.   °· Take medicines only as directed by your health care provider. °· If  you were prescribed an antibiotic medicine, finish it all even if you start to feel better. °· Avoid smoking and secondhand smoke. Exposure to cigarette smoke or irritating chemicals will make bronchitis worse. If you are a smoker, consider using nicotine gum or skin patches to help control withdrawal symptoms. Quitting smoking will help your lungs heal faster.   °· Reduce the chances of another bout of acute bronchitis by washing your hands frequently, avoiding people with cold symptoms, and trying not to touch your hands to your mouth, nose, or eyes.   °· Keep all follow-up visits as directed by your health care provider.   °SEEK MEDICAL CARE IF: °Your symptoms do not improve after 1 week of treatment.  °SEEK IMMEDIATE MEDICAL CARE IF: °· You develop an increased fever or chills.   °· You have chest pain.   °· You have severe shortness of breath. °· You have bloody sputum.   °· You develop dehydration. °· You faint or repeatedly feel like you are going to pass out. °· You develop repeated vomiting. °· You develop a severe headache. °MAKE SURE YOU:  °· Understand these instructions. °· Will watch your condition. °· Will get help right away if you are not doing well or get worse. °Document Released: 07/04/2004 Document Revised: 10/11/2013 Document Reviewed: 11/17/2012 °ExitCare® Patient Information ©2015 [employer]. This information is not intended to replace advice given to you by your health care provider. Make sure you discuss any questions you have with your health care provider. °Urinary Tract Infection °Urinary tract infections (UTIs) can develop anywhere along your urinary tract. Your urinary tract is your body's drainage system for removing   wastes and extra water. Your urinary tract includes two kidneys, two ureters, a bladder, and a urethra. Your kidneys are a pair of bean-shaped organs. Each kidney is about the size of your fist. They are located below your ribs, one on each side of your  spine. °CAUSES °Infections are caused by microbes, which are microscopic organisms, including fungi, viruses, and bacteria. These organisms are so small that they can only be seen through a microscope. Bacteria are the microbes that most commonly cause UTIs. °SYMPTOMS  °Symptoms of UTIs may vary by age and gender of the patient and by the location of the infection. Symptoms in young women typically include a frequent and intense urge to urinate and a painful, burning feeling in the bladder or urethra during urination. Older women and men are more likely to be tired, shaky, and weak and have muscle aches and abdominal pain. A fever may mean the infection is in your kidneys. Other symptoms of a kidney infection include pain in your back or sides below the ribs, nausea, and vomiting. °DIAGNOSIS °To diagnose a UTI, your caregiver will ask you about your symptoms. Your caregiver also will ask to provide a urine sample. The urine sample will be tested for bacteria and white blood cells. White blood cells are made by your body to help fight infection. °TREATMENT  °Typically, UTIs can be treated with medication. Because most UTIs are caused by a bacterial infection, they usually can be treated with the use of antibiotics. The choice of antibiotic and length of treatment depend on your symptoms and the type of bacteria causing your infection. °HOME CARE INSTRUCTIONS °· If you were prescribed antibiotics, take them exactly as your caregiver instructs you. Finish the medication even if you feel better after you have only taken some of the medication. °· Drink enough water and fluids to keep your urine clear or pale yellow. °· Avoid caffeine, tea, and carbonated beverages. They tend to irritate your bladder. °· Empty your bladder often. Avoid holding urine for long periods of time. °· Empty your bladder before and after sexual intercourse. °· After a bowel movement, women should cleanse from front to back. Use each tissue only  once. °SEEK MEDICAL CARE IF:  °· You have back pain. °· You develop a fever. °· Your symptoms do not begin to resolve within 3 days. °SEEK IMMEDIATE MEDICAL CARE IF:  °· You have severe back pain or lower abdominal pain. °· You develop chills. °· You have nausea or vomiting. °· You have continued burning or discomfort with urination. °MAKE SURE YOU:  °· Understand these instructions. °· Will watch your condition. °· Will get help right away if you are not doing well or get worse. °Document Released: 03/06/2005 Document Revised: 11/26/2011 Document Reviewed: 07/05/2011 °ExitCare® Patient Information ©2015 [employer]. This information is not intended to replace advice given to you by your health care provider. Make sure you discuss any questions you have with your health care provider. ° °

## 2014-09-09 NOTE — Progress Notes (Signed)
S: Called by nursing that pt had fever of 102 F.  Had some rhoncorous breath sounds as well.    O: Temp: 102F, Cardio: RRR, SpO2 95%, RR: 16 Gen: NAD, comfortable in bed Neck: No accessory muscle use  Cardio: RRR, 1/6 holodiastolic murmur at RUSB Lungs: Bibasilar rhonchi but no focal decreased breath sounds, rales, or wheezes.  No increased WOB.    A/P  1) Fever - Obtain stat CBC, CXR, BMET.  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  If continues to have fever and leukocytosis, may need ED evaluation again for source of infection.  Still on Rocephin at this time but may need to escalate therapy.  He does appear NAD and comfortable in bed.  Denies any new Sx.  [name], DO [facility] 09/09/2014, 10:20 AM

## 2014-09-12 ENCOUNTER — Non-Acute Institutional Stay: Payer: Medicare Other | Admitting: Family Medicine

## 2014-09-12 ENCOUNTER — Encounter: Payer: Self-pay | Admitting: Family Medicine

## 2014-09-12 DIAGNOSIS — N10 Acute tubulo-interstitial nephritis: Secondary | ICD-10-CM

## 2014-09-12 DIAGNOSIS — R509 Fever, unspecified: Secondary | ICD-10-CM | POA: Insufficient documentation

## 2014-09-12 NOTE — Progress Notes (Signed)
   Subjective:    Patient ID: Carlos Andrews, male    DOB: 07/01/1932, 81 y.o.   MRN: [id number]  Patient unaccompanied for interview Source of information is patient, nursing, and EMR for ED visit HPI CC; Fever to 102 and cough and SOB 09/09/14 [facility] for fever and cough and findings of RLL plate atelectasis on NH portable Chest Xray and hypernatremia. Evaluation at ED showed temp 101.4 with pulse 106 and RR 23. 156/81 CHEST XRAY in ED was unremarkable. WBC 10.2, Hgb 9.3,  Na 147, Cr 1.1, Alb 2.2, Lactice acid between 0.9 and 1.9 Urinalysis: 1.022/Pro 100/Nit neg/LE small/Hgb mod/    Micro: yeast/WBC 7-10/ RBC 11.20.  Urine Culture: GPC, yeast No evidence that Foley cather was changed prior to obtaining urine sample form Foley. Blood culture: NGTD, 72 hours. Pt received Cefepine IV and Vanc IV; DuoNeb, and IVF bolus NS x 2. during ED visit then [facility]  Highest temp was 100.3 on 09/10/14 with several tmeps around 99.5 over the weekend.   This is patient's 3rd ED visit in ~ one week.  09/04/14 EDV for congestion and fever. Diagnosis with URI. 09/01/14 EDV for blood and urethal meatus after pt pulledout Foley cath. Patient treated by Cystoscopy by Dr Berneice HeinrichManny [facility](urol) with wire placement of Foley cath because of trauma to pendulous urethra mucosa. Sent out with Levaquin and continue Rocephin IV. No urine culture obtained at that visit.  Urine Culture from 09/04/14 visit grew yeast.     Review of Systems No abdominal pain, No Dyspnea, No chills, no     Objective:   Physical Exam  Lying comfortably in bed at 30 degrees head elevation.  Foley cath in place without drainage from meatus. No gross blood in tubing.  Currently, patient remains pleasantly confused, with intermittent reality-based speech then speech unrelated to conversation or situation.  Speech is clear. Follows one step commands. No scrotal swelling nor testicular tenderness Abdomen: soft,  NT, ND, (+) BS Lungs: BCTA anteriorly. Ext: appropriately warm, trace bliateral pretibial edema    Assessment & Plan:

## 2014-09-12 NOTE — Assessment & Plan Note (Addendum)
Subsequent follow up of fever that onset 09/04/14. Urine Culture from 09/09/14 ED visit growing GPC and Yeast.  Prior Urine Culture from 09/04/14 ED visit growing only yeast.   While the growth of GPC on culture from a urine sample from a Foley that was that had been in place for 8 days Makes colonized Foley a very possible explanation for culture. Yet, given fever in patient with with Foley catherter, Carlos Andrews meets the [place] Criteria for a Urinary Tract Infection (likely upper tract) Will cover with Vanc IV while awaiting speciation and sensitivities of urine GPC. Rx Vanc 1000 mg IV q 12 hours x two doses, consulting [facility] to dose and monitor Vancomycin levels.   Appointment with Dr Judyann Munsonynthia Snider (ID) on 09/20/14

## 2014-09-13 ENCOUNTER — Encounter: Payer: Self-pay | Admitting: Family Medicine

## 2014-09-13 LAB — BASIC METABOLIC PANEL
BUN: 12 mg/dL (ref 4–21)
CALCIUM: 7.4 mg/dL
CO2: 25 mmol/L
Chloride: 111 mmol/L
Creat: 0.81
Glucose: 113
Potassium: 3 mmol/L
SODIUM: 144

## 2014-09-15 ENCOUNTER — Telehealth: Payer: Self-pay | Admitting: Family Medicine

## 2014-09-15 ENCOUNTER — Telehealth (HOSPITAL_BASED_OUTPATIENT_CLINIC_OR_DEPARTMENT_OTHER): Payer: Self-pay | Admitting: Emergency Medicine

## 2014-09-15 LAB — CULTURE, BLOOD (ROUTINE X 2)
Culture: NO GROWTH
Culture: NO GROWTH

## 2014-09-15 LAB — URINE CULTURE: Colony Count: >=100000

## 2014-09-15 NOTE — Telephone Encounter (Signed)
UCx is resistant to Vanc so pt has VRE.  Attempted to call Dr. Ilsa IhaSnyder from ID for discussion about this as well as Rocephin.  Awaiting back call but pt does have appointment coming up.  At this point, d/c the Vanc and will start on Zyvox on 600 mg IV BID.  [facility] and we will also start albuterol 2 puffs q 4 hrs.    Twana FirstBryan R. Paulina FusiHess, DO of [facility] 09/15/2014, 12:24 PM

## 2014-09-16 ENCOUNTER — Encounter (HOSPITAL_COMMUNITY): Payer: Self-pay | Admitting: Emergency Medicine

## 2014-09-16 ENCOUNTER — Emergency Department (HOSPITAL_COMMUNITY): Payer: Medicare Other

## 2014-09-16 ENCOUNTER — Inpatient Hospital Stay (HOSPITAL_COMMUNITY)
Admission: EM | Admit: 2014-09-16 | Discharge: 2014-10-09 | DRG: 296 | Disposition: E | Payer: Medicare Other | Attending: Internal Medicine | Admitting: Internal Medicine

## 2014-09-16 ENCOUNTER — Telehealth: Payer: Self-pay | Admitting: Family Medicine

## 2014-09-16 DIAGNOSIS — Z833 Family history of diabetes mellitus: Secondary | ICD-10-CM | POA: Diagnosis not present

## 2014-09-16 DIAGNOSIS — N182 Chronic kidney disease, stage 2 (mild): Secondary | ICD-10-CM | POA: Diagnosis present

## 2014-09-16 DIAGNOSIS — E872 Acidosis: Secondary | ICD-10-CM | POA: Diagnosis present

## 2014-09-16 DIAGNOSIS — Z515 Encounter for palliative care: Secondary | ICD-10-CM

## 2014-09-16 DIAGNOSIS — Z66 Do not resuscitate: Secondary | ICD-10-CM | POA: Diagnosis present

## 2014-09-16 DIAGNOSIS — Z87891 Personal history of nicotine dependence: Secondary | ICD-10-CM

## 2014-09-16 DIAGNOSIS — E1142 Type 2 diabetes mellitus with diabetic polyneuropathy: Secondary | ICD-10-CM | POA: Diagnosis present

## 2014-09-16 DIAGNOSIS — E1122 Type 2 diabetes mellitus with diabetic chronic kidney disease: Secondary | ICD-10-CM | POA: Diagnosis present

## 2014-09-16 DIAGNOSIS — E876 Hypokalemia: Secondary | ICD-10-CM | POA: Diagnosis present

## 2014-09-16 DIAGNOSIS — I469 Cardiac arrest, cause unspecified: Principal | ICD-10-CM | POA: Diagnosis present

## 2014-09-16 DIAGNOSIS — E870 Hyperosmolality and hypernatremia: Secondary | ICD-10-CM | POA: Diagnosis present

## 2014-09-16 DIAGNOSIS — Z8249 Family history of ischemic heart disease and other diseases of the circulatory system: Secondary | ICD-10-CM | POA: Diagnosis not present

## 2014-09-16 DIAGNOSIS — I5022 Chronic systolic (congestive) heart failure: Secondary | ICD-10-CM | POA: Diagnosis present

## 2014-09-16 DIAGNOSIS — G931 Anoxic brain damage, not elsewhere classified: Secondary | ICD-10-CM | POA: Diagnosis present

## 2014-09-16 DIAGNOSIS — D638 Anemia in other chronic diseases classified elsewhere: Secondary | ICD-10-CM | POA: Diagnosis present

## 2014-09-16 DIAGNOSIS — R570 Cardiogenic shock: Secondary | ICD-10-CM | POA: Diagnosis present

## 2014-09-16 DIAGNOSIS — I4891 Unspecified atrial fibrillation: Secondary | ICD-10-CM | POA: Diagnosis present

## 2014-09-16 DIAGNOSIS — I129 Hypertensive chronic kidney disease with stage 1 through stage 4 chronic kidney disease, or unspecified chronic kidney disease: Secondary | ICD-10-CM | POA: Diagnosis present

## 2014-09-16 DIAGNOSIS — J9600 Acute respiratory failure, unspecified whether with hypoxia or hypercapnia: Secondary | ICD-10-CM | POA: Diagnosis present

## 2014-09-16 DIAGNOSIS — E785 Hyperlipidemia, unspecified: Secondary | ICD-10-CM | POA: Diagnosis present

## 2014-09-16 LAB — BASIC METABOLIC PANEL
ANION GAP: 12 (ref 5–15)
Anion gap: 12 (ref 5–15)
BUN: 12 mg/dL (ref 6–23)
BUN: 14 mg/dL (ref 6–23)
CALCIUM: 7.6 mg/dL — AB (ref 8.4–10.5)
CO2: 20 mmol/L (ref 19–32)
CO2: 20 mmol/L (ref 19–32)
CREATININE: 1.29 mg/dL (ref 0.50–1.35)
CREATININE: 1.22 mg/dL (ref 0.50–1.35)
Calcium: 7.7 mg/dL — ABNORMAL LOW (ref 8.4–10.5)
Chloride: 117 mmol/L — ABNORMAL HIGH (ref 96–112)
Chloride: 115 mmol/L — ABNORMAL HIGH (ref 96–112)
GFR calc Af Amer: 59 mL/min — ABNORMAL LOW (ref >90)
GFR calc non Af Amer: 51 mL/min — ABNORMAL LOW (ref >90)
GFR calc non Af Amer: 55 mL/min — ABNORMAL LOW (ref >90)
GFR, EST AFRICAN AMERICAN: 63 mL/min — AB (ref >90)
Glucose, Bld: 147 mg/dL — ABNORMAL HIGH (ref 70–99)
Glucose, Bld: 114 mg/dL — ABNORMAL HIGH (ref 70–99)
Potassium: 3.2 mmol/L — ABNORMAL LOW (ref 3.5–5.1)
Potassium: 3.7 mmol/L (ref 3.5–5.1)
Sodium: 149 mmol/L — ABNORMAL HIGH (ref 135–145)
Sodium: 147 mmol/L — ABNORMAL HIGH (ref 135–145)

## 2014-09-16 LAB — GLUCOSE, CAPILLARY
Glucose-Capillary: 77 mg/dL (ref 70–99)
Glucose-Capillary: 86 mg/dL (ref 70–99)

## 2014-09-16 LAB — CBC
HEMATOCRIT: 23.7 % — AB (ref 39.0–52.0)
Hemoglobin: 7.4 g/dL — ABNORMAL LOW (ref 13.0–17.0)
MCH: 26.7 pg (ref 26.0–34.0)
MCHC: 31.2 g/dL (ref 30.0–36.0)
MCV: 85.6 fL (ref 78.0–100.0)
Platelets: 168 10*3/uL (ref 150–400)
RBC: 2.77 MIL/uL — ABNORMAL LOW (ref 4.22–5.81)
RDW: 17.7 % — ABNORMAL HIGH (ref 11.5–15.5)
WBC: 10.3 10*3/uL (ref 4.0–10.5)

## 2014-09-16 LAB — CALCIUM, IONIZED: CALCIUM ION: 1.24 mmol/L (ref 1.12–1.32)

## 2014-09-16 LAB — LACTIC ACID, PLASMA
LACTIC ACID, VENOUS: 8.8 mmol/L — AB (ref 0.5–2.0)
Lactic Acid, Venous: 4.7 mmol/L (ref 0.5–2.0)

## 2014-09-16 LAB — PROCALCITONIN: PROCALCITONIN: 0.23 ng/mL

## 2014-09-16 LAB — PHOSPHORUS: PHOSPHORUS: 6.7 mg/dL — AB (ref 2.3–4.6)

## 2014-09-16 LAB — MRSA PCR SCREENING: MRSA by PCR: NEGATIVE

## 2014-09-16 LAB — TSH: TSH: 0.301 u[IU]/mL — ABNORMAL LOW (ref 0.350–4.500)

## 2014-09-16 LAB — CBG MONITORING, ED: Glucose-Capillary: 87 mg/dL (ref 70–99)

## 2014-09-16 LAB — TROPONIN I
Troponin I: 0.03 ng/mL (ref <0.031)
Troponin I: 0.12 ng/mL — ABNORMAL HIGH (ref <0.031)

## 2014-09-16 LAB — APTT: APTT: 56 s — AB (ref 24–37)

## 2014-09-16 LAB — PROTIME-INR
INR: 1.54 — ABNORMAL HIGH (ref 0.00–1.49)
Prothrombin Time: 18.6 seconds — ABNORMAL HIGH (ref 11.6–15.2)

## 2014-09-16 LAB — I-STAT CG4 LACTIC ACID, ED: Lactic Acid, Venous: 8.74 mmol/L (ref 0.5–2.0)

## 2014-09-16 LAB — MAGNESIUM: Magnesium: 2.1 mg/dL (ref 1.5–2.5)

## 2014-09-16 LAB — CORTISOL: Cortisol, Plasma: 26.8 ug/dL

## 2014-09-16 MED ORDER — DOPAMINE-DEXTROSE 3.2-5 MG/ML-% IV SOLN: 8.0000 ug/kg/min | INTRAVENOUS | Status: DC

## 2014-09-16 MED ORDER — INSULIN ASPART 100 UNIT/ML ~~LOC~~ SOLN: 0.0000 [IU] | SUBCUTANEOUS | Status: DC

## 2014-09-16 MED ORDER — POTASSIUM CHLORIDE 20 MEQ/15ML (10%) PO SOLN
40.0000 meq | Freq: Once | ORAL | Status: AC
  Administered 2014-09-16: 40 meq
  Filled 2014-09-16: qty 30

## 2014-09-16 MED ORDER — FAMOTIDINE IN NACL 20-0.9 MG/50ML-% IV SOLN
20.0000 mg | Freq: Two times a day (BID) | INTRAVENOUS | Status: DC
  Administered 2014-09-16: 20 mg via INTRAVENOUS
  Filled 2014-09-16 (×2): qty 50

## 2014-09-16 MED ORDER — FENTANYL CITRATE 0.05 MG/ML IJ SOLN
50.0000 ug | INTRAMUSCULAR | Status: DC | PRN
  Administered 2014-09-16: 50 ug via INTRAVENOUS
  Filled 2014-09-16: qty 2

## 2014-09-16 MED ORDER — NOREPINEPHRINE BITARTRATE 1 MG/ML IV SOLN
2.0000 ug/min | INTRAVENOUS | Status: DC
  Administered 2014-09-16: 10 ug/min via INTRAVENOUS
  Administered 2014-09-16: 20 ug/min via INTRAVENOUS
  Filled 2014-09-16 (×2): qty 4

## 2014-09-16 MED ORDER — CETYLPYRIDINIUM CHLORIDE 0.05 % MT LIQD
7.0000 mL | Freq: Four times a day (QID) | OROMUCOSAL | Status: DC
  Administered 2014-09-16: 7 mL via OROMUCOSAL

## 2014-09-16 MED ORDER — SODIUM CHLORIDE 0.9 % IV SOLN: 250.0000 mL | INTRAVENOUS | Status: DC | PRN

## 2014-09-16 MED ORDER — CEFTAZIDIME 2 G IJ SOLR
2.0000 g | Freq: Two times a day (BID) | INTRAMUSCULAR | Status: DC
  Administered 2014-09-16: 2 g via INTRAVENOUS
  Filled 2014-09-16 (×2): qty 2

## 2014-09-16 MED ORDER — HEPARIN SODIUM (PORCINE) 5000 UNIT/ML IJ SOLN: 5000.0000 [IU] | Freq: Three times a day (TID) | INTRAMUSCULAR | Status: DC

## 2014-09-16 MED ORDER — LORAZEPAM BOLUS VIA INFUSION
2.0000 mg | INTRAVENOUS | Status: DC | PRN
  Filled 2014-09-16: qty 5

## 2014-09-16 MED ORDER — DOPAMINE-DEXTROSE 3.2-5 MG/ML-% IV SOLN
INTRAVENOUS | Status: AC
  Filled 2014-09-16: qty 250

## 2014-09-16 MED ORDER — FENTANYL BOLUS VIA INFUSION
50.0000 ug | INTRAVENOUS | Status: DC | PRN
  Filled 2014-09-16: qty 200

## 2014-09-16 MED ORDER — SODIUM CHLORIDE 0.9 % IJ SOLN: 10.0000 mL | INTRAMUSCULAR | Status: DC | PRN

## 2014-09-16 MED ORDER — NOREPINEPHRINE BITARTRATE 1 MG/ML IV SOLN
0.0000 ug/min | Freq: Once | INTRAVENOUS | Status: AC
  Administered 2014-09-16: 20 ug/min via INTRAVENOUS

## 2014-09-16 MED ORDER — SODIUM CHLORIDE 0.9 % IV SOLN
25.0000 ug/h | INTRAVENOUS | Status: DC
  Administered 2014-09-16: 50 ug/h via INTRAVENOUS
  Filled 2014-09-16: qty 50

## 2014-09-16 MED ORDER — SODIUM CHLORIDE 0.9 % IV SOLN: INTRAVENOUS | Status: DC

## 2014-09-16 MED ORDER — ASPIRIN 81 MG PO CHEW
324.0000 mg | CHEWABLE_TABLET | ORAL | Status: AC
  Filled 2014-09-16: qty 4

## 2014-09-16 MED ORDER — EPINEPHRINE HCL 0.1 MG/ML IJ SOSY
PREFILLED_SYRINGE | INTRAMUSCULAR | Status: DC | PRN
  Administered 2014-09-16: 1 via INTRAVENOUS

## 2014-09-16 MED ORDER — ASPIRIN 300 MG RE SUPP
300.0000 mg | RECTAL | Status: AC
  Administered 2014-09-16: 300 mg via RECTAL
  Filled 2014-09-16: qty 1

## 2014-09-16 MED ORDER — FENTANYL CITRATE 0.05 MG/ML IJ SOLN: 50.0000 ug | INTRAMUSCULAR | Status: DC | PRN

## 2014-09-16 MED ORDER — SODIUM CHLORIDE 0.9 % IV SOLN
INTRAVENOUS | Status: DC
Start: 2014-09-16 — End: 2014-09-16
  Administered 2014-09-16: 13:00:00 via INTRAVENOUS

## 2014-09-16 MED ORDER — SODIUM CHLORIDE 0.9 % IV SOLN
1.0000 g | Freq: Once | INTRAVENOUS | Status: AC
  Administered 2014-09-16: 1 g via INTRAVENOUS
  Filled 2014-09-16: qty 10

## 2014-09-16 MED ORDER — NOREPINEPHRINE BITARTRATE 1 MG/ML IV SOLN
0.0000 ug/min | Freq: Once | INTRAVENOUS | Status: DC
  Filled 2014-09-16: qty 4

## 2014-09-16 MED ORDER — CHLORHEXIDINE GLUCONATE 0.12 % MT SOLN
15.0000 mL | Freq: Two times a day (BID) | OROMUCOSAL | Status: DC
  Administered 2014-09-16: 15 mL via OROMUCOSAL
  Filled 2014-09-16: qty 15

## 2014-09-16 MED ORDER — SODIUM CHLORIDE 0.9 % IV SOLN
2000.0000 mL | Freq: Once | INTRAVENOUS | Status: AC
  Administered 2014-09-16: 2000 mL via INTRAVENOUS

## 2014-09-16 MED ORDER — LORAZEPAM 2 MG/ML IJ SOLN
1.0000 mg/h | INTRAVENOUS | Status: DC
  Administered 2014-09-16: 1 mg/h via INTRAVENOUS
  Filled 2014-09-16: qty 25

## 2014-09-16 MED ORDER — DOPAMINE-DEXTROSE 3.2-5 MG/ML-% IV SOLN
5.0000 ug/kg/min | Freq: Once | INTRAVENOUS | Status: AC
  Administered 2014-09-16: 15 ug/kg/min via INTRAVENOUS

## 2014-09-16 MED ORDER — LINEZOLID 2 MG/ML IV SOLN
600.0000 mg | Freq: Two times a day (BID) | INTRAVENOUS | Status: DC
  Administered 2014-09-16: 600 mg via INTRAVENOUS
  Filled 2014-09-16 (×2): qty 300

## 2014-09-16 MED FILL — Medication: Qty: 1 | Status: AC

## 2014-09-20 ENCOUNTER — Telehealth: Payer: Self-pay

## 2014-09-20 ENCOUNTER — Inpatient Hospital Stay: Payer: Medicare Other | Admitting: Internal Medicine

## 2014-09-20 NOTE — Telephone Encounter (Signed)
Received death [facility] 09/20/14.  [facility].  Received back and called for pick-up 09/23/14.

## 2014-09-20 NOTE — Discharge Summary (Signed)
Loran SentersHerman R Sigala was a 81 y.o. male [facility] with PEA cardiac arrest.  He required 30 minutes before ROSC.  He was intubated, and required several pressor medications.  In spite of this his blood pressure remained low.  Upon arrival of family, they were in agreement that resuscitative measures should not be continued since his chance for meaningful recovery were grim.  He was made DNR and transitioned to comfort measures.  He subsequently expired on Jun 13, 2014 at 1506.  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

## 2014-09-22 LAB — CULTURE, BLOOD (ROUTINE X 2)
CULTURE: NO GROWTH
Culture: NO GROWTH

## 2014-10-09 NOTE — ED Notes (Signed)
Family at bedside. 

## 2014-10-09 NOTE — Progress Notes (Signed)
Monitor alarmed for asystole while RN in room providing comfort to wife and son. Charge RN notified to assist with listening to hear tones. This RN and C. Kamdon Reisig listened to heart sound  For 1 full minute and pt pronounced at 1506. Comfort provided to pt's wife and son. Family unsure what funeral home will be used, Pt placement phone number given to son to call when sure of funeral home.Pt had no belongings at the bedside. Dr. Vassie LollAlva notified, not an ME case. CDS also notified,see CDS form. Post mortem care done. Pt transported to morgue via stretcher.   240 ml of Fentanyl and 45 ml of Ativan wasted in sink and witnessed by C. Kimila Papaleo, RN. Will cont to monitor closely.

## 2014-10-09 NOTE — Progress Notes (Signed)
Withdrawal of care initiated, RT notified. Pt extubated to [place] without difficulty. Chaplain here to offer support to family. Fentanyl and Ativan gtt ongoing. Will cont to provide comfort care to pt and family.

## 2014-10-09 NOTE — Progress Notes (Signed)
Chaplain responded to consult, family at bedside.  Family not desiring chaplain services at this time as pt's son is [employer].  Chaplain made introduction and informed family that chaplain services are available should they change their minds.  Chaplain available to follow up if needed.    2014-11-28 1400  Clinical Encounter Type  Visited With Patient and family together  Visit Type Initial;Patient actively dying  Referral From Nurse  Spiritual Encounters  Spiritual Needs Emotional;Grief support  Stress Factors  Family Stress Factors Family relationships;Loss   Blain PaisOvercash, Torrey Horseman A, Chaplain 08-14-14 2:47 PM

## 2014-10-09 NOTE — ED Notes (Signed)
Dr. Norlene Campbelltter performing cardiac ultrasound at this time, cardiac activity present.

## 2014-10-09 NOTE — ED Notes (Signed)
BS 87

## 2014-10-09 NOTE — Procedures (Signed)
Extubation Procedure Note  Patient Details:   Name: Carlos SentersHerman R Andrews DOB: 12/05/1932 MRN: [id number]   Airway Documentation:  Airway 7 mm (Active)  Secured at (cm) 26 cm 10/08/2014 12:36 PM  Measured From Lips 09/09/2014 12:36 PM  Secured Location Center 09/11/2014 12:36 PM  Secured By [employer] Tube Holder 09/27/2014 12:36 PM  Tube Holder Repositioned Yes 09/22/2014 12:36 PM  Cuff Pressure (cm H2O) 10 cm H2O 09/09/2014  3:40 AM  Site Condition Dry 09/25/2014 12:36 PM    extuabted to 3lpm [place]  Evaluation  O2 sats: stable throughout Complications: No apparent complications Patient did not tolerate procedure well. Bilateral Breath Sounds: Diminished Suctioning: Airway Yes  Renae FickleScott, Zurri Rudden Michelle 09/19/2014, 2:31 PM

## 2014-10-09 NOTE — Progress Notes (Signed)
CRITICAL VALUE ALERT  Critical value received: Lactic acid = 4.7  Date of notification:  09/15/2014  Time of notification:  1056  Critical value read back:yes   Nurse who received alert: Melina ModenaAnde, RN  MD notified (1st page):  Not notified, Lactic acid trending down from previous value  Time of first page:    MD notified (2nd page):  Time of second page:  Responding MD:    Time MD responded:

## 2014-10-09 NOTE — Progress Notes (Signed)
eLink Physician-Brief Progress Note Patient Name: Carlos SentersHerman R Lory DOB: 06/26/1932 MRN: [id number]   Date of Service  09/11/2014  HPI/Events of Note    eICU Interventions  pepcid for stress ulcer prophylaxis     Intervention Category Intermediate Interventions: Best-practice therapies (e.g. DVT, beta blocker, etc.)  Isiaih Hollenbach 09/24/2014, 6:05 AM

## 2014-10-09 NOTE — Progress Notes (Addendum)
ANTIBIOTIC CONSULT NOTE - INITIAL  Pharmacy Consult for Elita QuickFortaz and Zyvox Indication: bacteremia and VRE UTI  Allergies  Allergen Reactions  . Naprosyn [Naproxen] Other (See Comments)    GI upset  . Rofecoxib     REACTION: dizzy  . Tramadol Hcl     REACTION: vomiting    Patient Measurements: Height: 5\' 5"  (165.1 cm) Weight: 164 lb 14.5 oz (74.8 kg) IBW/kg (Calculated) : 61.5  Vital Signs: BP: 74/40 mmHg (04/08 0345) Pulse Rate: 83 (04/08 0345)  Labs:  Recent Labs  29-Jun-2014 0325  CREATININE 1.29   Estimated Creatinine Clearance: 43.9 mL/min (by C-G formula based on Cr of 1.29).   Microbiology: Recent Results (from the past 720 hour(s))  Urine culture     Status: None   Collection Time: 09/04/14  4:37 AM  Result Value Ref Range Status   Specimen Description URINE, RANDOM  Final   Special Requests NONE  Final   Colony Count   Final    >=100,000 COLONIES/ML Performed at [facility]    Culture YEAST Performed at [facility]   Final   Report Status 09/06/2014 FINAL  Final  Blood Culture (routine x 2)     Status: None   Collection Time: 09/09/14  4:03 PM  Result Value Ref Range Status   Specimen Description BLOOD RIGHT PICC  4 ML IN [facility] B[facility]  Final   Special Requests NONE  Final   Culture   Final    NO GROWTH 5 DAYS Performed at [facility]    Report Status 09/15/2014 FINAL  Final  Urine culture     Status: None   Collection Time: 09/09/14  4:18 PM  Result Value Ref Range Status   Specimen Description URINE, CATHETERIZED  Final   Special Requests NONE  Final   Colony Count   Final    >=100,000 COLONIES/ML Performed at [facility]    Culture   Final    VANCOMYCIN RESISTANT ENTEROCOCCUS ISOLATED 5150 Note: CRITICAL RESULT CALLED TO, READ BACK BY AND VERIFIED WITH: CINDY AT 4/7 AT 0856 BY DUNNJ REPORT FAXED BY REQUEST [phone] YEAST Performed at [facility]    Report Status 09/15/2014 FINAL  Final   Organism  ID, Bacteria VANCOMYCIN RESISTANT ENTEROCOCCUS ISOLATED  Final      Susceptibility   Vancomycin resistant enterococcus isolated - MIC*    AMPICILLIN >=32 RESISTANT Resistant     LEVOFLOXACIN >=8 RESISTANT Resistant     NITROFURANTOIN 128 RESISTANT Resistant     TETRACYCLINE >=16 RESISTANT Resistant     LINEZOLID 2 SENSITIVE Sensitive     VANCOMYCIN 256 RESISTANT Resistant     * VANCOMYCIN RESISTANT ENTEROCOCCUS ISOLATED  Blood Culture (routine x 2)     Status: None   Collection Time: 09/09/14  4:20 PM  Result Value Ref Range Status   Specimen Description BLOOD LEFT ARM  5 ML IN [facility]  Final   Special Requests NONE  Final   Culture   Final    NO GROWTH 5 DAYS Performed at [facility]    Report Status 09/15/2014 FINAL  Final    Medical History: Past Medical History  Diagnosis Date  . DIABETES MELLITUS, II, COMPLICATIONS 08/07/2006  . Esophageal perforation 1993    [facility]  . PPD positive 1993    [facility]  . Spinal stenosis of lumbar region at multiple levels   . Proteinuria 08/07/2006    Qualifier: Diagnosis  of  By: Haydee Salter    . Open angle with borderline findings and low glaucoma risk in both eyes 02/23/2013  . Regular astigmatism 02/23/2013  . Presbyopia 02/23/2013  . Ptosis of left eyelid 02/23/2013  . At risk for falling 10/04/2013  . GAIT DISTURBANCE 08/01/2008        . HYPERLIPIDEMIA 08/07/2006    Qualifier: Diagnosis of  By: Haydee Salter    . HYPERTENSION, BENIGN SYSTEMIC 08/07/2006    Qualifier: Diagnosis of  By: Haydee Salter    . IMPOTENCE, ORGANIC 08/07/2006    Qualifier: Diagnosis of  By: Haydee Salter    . LUMBAR SPRAIN AND STRAIN 07/19/2008    Qualifier: Diagnosis of  By: Mauricio Po MD, Fayrene Fearing    . Myopia with presbyopia of left eye 02/23/2013  . OBESITY, NOS 08/07/2006    Qualifier: Diagnosis of  By: Haydee Salter    . OSTEOARTHRITIS, LOWER LEG 08/07/2006    Qualifier: Diagnosis of  By: Haydee Salter    . PERIPHERAL EDEMA  08/01/2008    Qualifier: Diagnosis of  By: Sheffield Slider MD, Deniece Portela    . Peripheral vascular disease, unspecified 04/21/2012    Severe tibial occlusive disease (Dr Hart Rochester, Vasc Surgery, 04/2002)     . Recurrent falls 10/04/2013  . Skin ulcer of foot, left heel 02/23/2013  . Type II or unspecified type diabetes mellitus without mention of complication, not stated as uncontrolled 08/07/2006    Qualifier: Diagnosis of  By: Haydee Salter    . Unspecified hereditary and idiopathic peripheral neuropathy 08/07/2006        . Proteinuria 08/07/2006  . Impairment of balance 05/10/2014  . Urge incontinence 05/10/2014  . Impaired mobility and ADLs 05/10/2014  . Diabetes mellitus due to underlying condition with diabetic polyneuropathy 08/07/2006    Qualifier: Diagnosis of  By: Haydee Salter    . Difficulty in voiding 08/22/2014  . 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 Chronic systolic heart failure 08/22/2014  . Mild to Moderate aortic regurgitation 08/22/2014  . Heart failure with reduced ejection fraction 08/22/2014  . Discitis of lumbar region 08/22/2014    Hyperintense signal on Lumbar MRI 08/12/14 in L2-L3 disc.     Assessment: 81yo male presents from SNF s/p cardiac arrest, has had multiple ED visits recently w/ admission ~8mo ago, at that time had been started on six weeks IV ABX for bacteremia 2/2 discitis and psoas abscess, also w/ new urine Cx showing VRE, to begin/continue IV ABX.   Plan:  Was on Rocephin 2g IV Q24H (last dose 4/7 1000 w/ plan to continue until 4/11) and vanc 1g Q12H (now stopped), linezolid was ordered to start 4/7 but was not charted; will change Rocephin to Fortaz 2g IV Q12H and begin linezolid  IV Q12H and monitor CBC, Cx.  Vernard Gambles, PharmD, BCPS  09/17/14,4:34 AM

## 2014-10-09 NOTE — H&P (Signed)
PULMONARY / CRITICAL CARE MEDICINE   Name: Carlos Andrews MRN: [id number] DOB: [birth year]    ADMISSION DATE:  Oct 05, 2014 CONSULTATION DATE:  10/05/14  REFERRING MD :  EDP  CHIEF COMPLAINT:  Cardiac Arrest  INITIAL PRESENTATION:  81 y.o. M from [place] nursing home with multiple co-morbidities brought to [facility] ED 4/8 after suffering unwitnessed PEA arrest.  ACLS for 30 minutes; however, last seen normal 2 hours prior.  In ED, remained unresponsive and hypotensive.  PCCM to admit.   STUDIES:  CXR 4/8 >>> atx right base  SIGNIFICANT EVENTS: 4/8 - admit after cardiac arrest, ACLS 30 minutes; however, pt last seen normal 2 hours prior to start of ACLS.   HISTORY OF PRESENT ILLNESS:  Pt is encephalopathic; therefore, this HPI is obtained from chart review. Carlos Andrews is a 81 y.o. M with PMH as outlined below.  He comes from [place] nursing home where he has been residing for what seems to be FTT following L2 and L3 decompressive lumbar laminectomy with microdissection and swab of potential epidural abscess (08/13/14).  During that hospitalization, he was found to have bacteremia due to discitis and psoas abscess (blood cultures positive for microaerophilic strep).  He was discharged 08/19/14 with 6 weeks of ceftriaxone via PICC.  Has had multiple ED visits in the past few weeks (URI, HCAP, hematuria).    During early AM hours of 4/8, nursing home staff found him apneic and without pulses.  This was around 2:05am and pt last seen normal at midnight.  EMS dispatched and found to be in PEA.  ACLS performed for roughly 30 minutes before ROSC.  He received 5 epi's in route to ED.  On arrival to ED, he had bradycardia and required an additional epi.  In addition, BP remained low at 53/40, therefore, he was started on levophed and dopamine.  On my arrival to ED, pt is fully non-responsive.  He has received no sedation since arrival to ED.  He has no gag reflex, no dolls eye, no pupillary response, does  not withdraw to pain, and does not initiate any respirations on his own.  He is currently on Dopamine and Levophed.    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); LUMBAR SPRAIN AND STRAIN (07/19/2008); Myopia with presbyopia of left eye (02/23/2013); OBESITY, NOS (08/07/2006); OSTEOARTHRITIS, LOWER LEG (08/07/2006); PERIPHERAL EDEMA (08/01/2008); Peripheral vascular disease, unspecified (04/21/2012); Recurrent falls (10/04/2013); Skin ulcer of foot, left heel (02/23/2013); Type II or unspecified type diabetes mellitus without mention of complication, not stated as uncontrolled (08/07/2006); Unspecified hereditary and idiopathic peripheral neuropathy (08/07/2006); Proteinuria (08/07/2006); Impairment of balance (05/10/2014); Urge incontinence (05/10/2014); Impaired mobility and ADLs (05/10/2014); Diabetes mellitus due to underlying condition with diabetic polyneuropathy (08/07/2006); Difficulty in voiding (08/22/2014); Psoas abscess, 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  has past surgical history that includes Neck surgery (1993); Cataract extraction; Thoracotomy (1993); Spinal cord decompression (1993); Jejunostomy feeding tube (1993); esophageal perforation (1993); Lumbar epidural injection (2010); Lumbar  laminectomy/decompression microdiscectomy (  N/A, 08/13/2014); and Cysto (N/A, 09/02/2014). Prior to Admission medications   Medication Sig Start Date End Date Taking? Authorizing Provider  acetaminophen (TYLENOL) 500 MG tablet Take 500 mg by mouth every 6 (six) hours.    Historical Provider, MD  Ascorbic Acid (VITAMIN C) 500 MG CHEW Chew by mouth daily.      Historical Provider, MD  aspirin 81 MG tablet Take 81 mg by mouth daily.      Historical Provider, MD  bisacodyl (DULCOLAX) 10 MG suppository Place 10 mg rectally as needed for moderate constipation. For constipation unrelieved by milk of magnesia.    Historical Provider, MD  calcium-vitamin D (OSCAL WITH D 250-125) 250-125 MG-UNIT per tablet Take 1 tablet by mouth daily.      Historical Provider, MD  cefTRIAXone (ROCEPHIN) 40 MG/ML IVPB Inject 50 mLs (2 g total) into the vein daily. 08/19/14 09/27/14  Pincus LargeJazma Y Phelps, DO  Fish Oil OIL Take 1,000 mg by mouth daily.     Historical Provider, MD  food thickener (THICK IT) POWD Take 1 Container by mouth as needed (Per Dsyphagia 3 Diet, to make liquids Nectar Thi\ck). 08/18/14   Pincus LargeJazma Y Phelps, DO  gabapentin (NEURONTIN) 300 MG capsule Take 1 capsule (300 mg total) by mouth 2 (two) times daily. 08/18/14   Pincus LargeJazma Y Phelps, DO  insulin aspart (NOVOLOG) 100 UNIT/ML injection Inject 3 Units into the skin 3 (three) times daily with meals. 08/18/14   Pincus LargeJazma Y Phelps, DO  insulin glargine (LANTUS) 100 UNIT/ML injection Inject 0.2 mLs (20 Units total) into the skin at bedtime. 08/18/14   Pincus LargeJazma Y Phelps, DO  lisinopril (PRINIVIL,ZESTRIL) 20 MG tablet Take 1 tablet (20 mg total) by mouth at bedtime. 02/09/14   Leighton Roachodd D McDiarmid, MD  magnesium hydroxide (MILK OF MAGNESIA) 400 MG/5ML suspension Take 30 mLs by mouth daily as needed for mild constipation.    Historical Provider, MD  Multiple Vitamin (MULTIVITAMIN WITH MINERALS) TABS tablet Take 1 tablet by mouth daily.    Historical Provider, MD  Multiple Vitamin (MULTIVITAMIN)  tablet Take 1 tablet by mouth daily.      Historical Provider, MD  nutrition supplement, JUVEN, (JUVEN) PACK Take 1 packet by mouth 2 (two) times daily. With 240 ml juice.    Historical Provider, MD  Nutritional Supplements (NUTRITIONAL SHAKE) LIQD Take 120 mLs by mouth 2 (two) times daily. [employer].    Historical Provider, MD  oxyCODONE (OXY IR/ROXICODONE) 5 MG immediate release tablet Take 0.5 tablets (2.5 mg total) by mouth every 4 (four) hours as needed for breakthrough pain. 09/08/14   Leighton Roachodd D McDiarmid, MD  oxyCODONE (OXY IR/ROXICODONE) 5 MG immediate release tablet Take 5 mg by mouth 4 (four) times daily.    Historical Provider, MD  Pyridoxine HCl (VITAMIN B-6) 100 MG tablet 3 tabs once daily.     Historical Provider, MD  senna-docusate (SENOKOT-S) 8.6-50 MG per tablet Take 2 tablets by mouth 2 (two) times daily. 08/18/14   Pincus LargeJazma Y Phelps, DO  Sodium Phosphates (RA SALINE ENEMA RE) Place 1 enema rectally once as needed (for constipation unrelieved by bisacodyl suppositories. Contact MD if no results.).    Historical Provider, MD  tamsulosin (FLOMAX) 0.4 MG CAPS capsule Take 0.4 mg by mouth daily.    Historical Provider, MD  vitamin B-12 (CYANOCOBALAMIN) 1000 MCG tablet Take 1,000 mcg by mouth daily.      Historical Provider, MD   Allergies  Allergen Reactions  . Naprosyn [Naproxen] Other (See Comments)  GI upset  . Rofecoxib     REACTION: dizzy  . Tramadol Hcl     REACTION: vomiting    FAMILY HISTORY:  Family History  Problem Relation Age of Onset  . Diabetes Mother   . Diabetes Father   . Hypertension Father   . Other Father     amputaion  . Diabetes Son   . Hypertension Son     SOCIAL HISTORY:  reports that he quit smoking about 27 years ago. His smoking use included Cigarettes. He has never used smokeless tobacco. He reports that he does not drink alcohol or use illicit drugs.  REVIEW OF SYSTEMS:  Unable to obtain as pt is encephalopathic.  SUBJECTIVE:   VITAL  SIGNS: Pulse Rate:  [70-115] 83 (04/08 0345) Resp:  [8-18] 11 (04/08 0345) BP: (58-87)/(28-68) 74/40 mmHg (04/08 0345) SpO2:  [100 %] 100 % (04/08 0345) HEMODYNAMICS:   VENTILATOR SETTINGS:   INTAKE / OUTPUT: Intake/Output    None     PHYSICAL EXAMINATION: General: Chronically ill appearing male, non-responsive. Neuro: Does not follow commands.  No corneals, no dolls eye, no gag, no spontaneous respirations, no withdrawal to pain. HEENT: [place]/AT. Pupils non-reactive, sclerae anicteric. Cardiovascular: IRIR, no M/R/G.  Lungs: Respirations even and unlabored.  Coarse bilaterally. Abdomen: BS x 4, soft, NT/ND.  Musculoskeletal: No gross deformities, no edema.  Skin: Intact, warm, no rashes.  LABS:  CBC  Recent Labs Lab 09/09/14 1603  WBC 10.2  HGB 9.3*  HCT 28.9*  PLT 237   Coag's No results for input(s): APTT, INR in the last 168 hours. BMET  Recent Labs Lab 09/09/14 1603 09/12/14  NA 147* 144  K 3.7 3.0  CL 110 111  CO2 26 25  BUN 18 12  CREATININE 1.07 0.81  GLUCOSE 97  --    Electrolytes  Recent Labs Lab 09/09/14 1603 09/12/14  CALCIUM 8.2* 7.4   Sepsis Markers  Recent Labs Lab 09/09/14 1611 09/09/14 1941 2014-10-02 0331  LATICACIDVEN 0.91 1.90 8.74*   ABG No results for input(s): PHART, PCO2ART, PO2ART in the last 168 hours. Liver Enzymes  Recent Labs Lab 09/09/14 1603  AST 40*  ALT 23  ALKPHOS 67  BILITOT 0.7  ALBUMIN 2.2*   Cardiac Enzymes No results for input(s): TROPONINI, PROBNP in the last 168 hours. Glucose No results for input(s): GLUCAP in the last 168 hours.  Imaging Dg Chest Port 1 View  Oct 02, 2014   CLINICAL DATA:  OG and ETT placement.  EXAM: PORTABLE CHEST - 1 VIEW  COMPARISON:  09/09/2014  FINDINGS: Endotracheal tube placed with tip measuring 2.2 cm above the carina. Enteric tube tip is off the field of view below the left hemidiaphragm. Proximal side hole is projected over the upper stomach. Right PICC catheter tip is  over the low SVC. No pneumothorax. Atelectasis in the right lung base. No focal consolidation. No blunting of costophrenic angles. Mediastinal contours appear intact. Calcified and tortuous aorta.  IMPRESSION: Appliances appear to be in satisfactory location. Atelectasis in the right lung base.   Electronically Signed   By: Burman Nieves M.D.   On: Oct 02, 2014 03:50    ASSESSMENT / PLAN:  PULMONARY OETT 4/8 >>> A: Acute respiratory failure following cardiac arrest Atelctasis P:   Full mechanical support, wean as able. VAP bundle. DuoNebs / Albuterol. ABG and CXR in AM.  CARDIOVASCULAR A:  S/p PEA arrest with prolonged downtime Shock - likely cardiogenic Hx PVD, combined CHF - last echo from 08/16/14 with  EF 30-35% DO NOT RESUSCITATE P:  Continue levophed and dopamine for goal MAP > 65. Trend troponin / lactate.  RENAL A:   Hypokalemia Hypocalcemia P:   NS @ 100. Potassium per tube. 1g Ca gluconate empirically. BMP in AM.  GASTROINTESTINAL A:   GI prophylaxis Nutrition P:   SUP: Pantoprazole. NPO. TF if remains NPO > 24 hours.  HEMATOLOGIC A:   Chronic anemia VTE Prophylaxis P:  Transfuse for Hgb < 7. SCD's / Heparin. CBC in AM.  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 Abx: Zyvox, start date 4/8, day 1/x. PCT algorithm to limit abx exposure.  ENDOCRINE A:   DM P:   CBG's q4hr. SSI. TSH. Cortisol.  NEUROLOGIC A:   Acute encephalopathy very concerning for severe anoxia given exam findings Hx discitis, psoas abscess with blood cultures positive for microaeorophilic strep, lumbar stenosis s/sp L2 and L3 decompressive lumbar laminectomy with microdissection (08/13/14), depression P:   Sedation:  Fentanyl PRN. RASS goal: 0  May need versed if myoclonus noted   Family updated: Son over  phone.  Family agreeable upon DNR.  Discussing withdrawal / comfort measures this AM and will update nursing ASAP.  They would like to speak with social work as well.  Will likely move towards comfort care soon.  Interdisciplinary Family Meeting v Palliative Care Meeting:  Due by: 4/14.   Rutherford Guys, [place] [facility] Pager: [phone]  or [phone] 2014-10-12, 3:54 AM STAFF NOTE: I, Rory Percy, MD FACP have personally reviewed patient's available data, including medical history, events of note, physical examination and test results as part of my evaluation. I have discussed with resident/NP and other care providers such as pharmacist, RN and RRT. In addition, I personally evaluated patient and elicited key findings of: neuro exam horrible, nonreactive pupils, unwitnessed arrest over 2 hours, now 30 min pea, likley is infectious etiology, unfortunately work up [employer] change his outcome, change ceftr to ceftaz nosocomial, family does NOT want to escalate, they want social work then comfort this am likely, dop to Beta affect, levo is maxed, attempt to reduce O2 on vent, poor prognosis, dc echo, dc abg, would need morphine  The patient is critically ill with multiple organ systems failure and requires high complexity decision making for assessment and support, frequent evaluation and titration of therapies, application of advanced monitoring technologies and extensive interpretation of multiple databases.   Critical Care Time devoted to patient care services described in this note is35 Minutes. This time reflects time of care of this signee: Rory Percy, MD FACP. This critical care time does not reflect procedure time, or teaching time or supervisory time of PA/NP/Med student/Med Resident etc but could involve care discussion time. Rest per NP/medical resident whose note is outlined above and that I agree with   Mcarthur Rossetti. Tyson Alias, MD,  FACP Pgr: [phone] [place] [facility] 10-12-14 6:00 AM

## 2014-10-09 NOTE — Progress Notes (Signed)
Spoke with pt's family.  They are in agreement to transition to comfort care when remainder of family arrives.  He is DNR.  No escalation of care.  Coralyn HellingVineet Saesha Llerenas, MD [facility] 09/10/2014, 12:42 PM Pager:  [phone] After 3pm call: [phone]

## 2014-10-09 NOTE — ED Notes (Addendum)
Pt arrives POST CPR via EMS from [facility], downtime estimated to be about 30 minutes. ROSC 0238. 5 epis given en route. Last seen alive at 0030, found down at 0205. CPR in progress upon EMS arrival. 53/40 last BP, pulses present at this time. HX MRSA, UTI, currently being treated for UTI. CBG 59 upon EMS arrival, 1 amp D50 given PTA

## 2014-10-09 NOTE — ED Provider Notes (Signed)
CSN: [id number]     Arrival date & time 2014-09-18  0306 History  This chart was scribed for Marisa Severin, MD by Annye Asa, ED Scribe. This patient was seen in room [facility] and the patient's care was started at 3:09 AM.    Chief Complaint  Patient presents with  . Cardiac Arrest   The history is provided by the EMS personnel. No language interpreter was used.     HPI Comments: Carlos Andrews is a 81 y.o. male with past medical history of DM, er EMS, [place] facility staff state patient was last seen normal at 00:30 this morning; he was found down at 02:05, at which time facility staff initiated CPR. EMS arrived and continued CPR at 02:15. EMS administered 5 epi en route; last dose around 2:55. Return of circulation at 02:38. CPR in progress on EMS arrival to ED. Last BP 53/40; pulses present at this time. Per EMS, EKG upon their arrival PEA, appeared to be sinus  Per records, patient visited the ER on 4/01 for known pneumonia; on 3/27 for URI, fever; on 3/24 for hematuria.   Past Medical History  Diagnosis Date  . DIABETES MELLITUS, II, COMPLICATIONS 08/07/2006  . Esophageal perforation 1993    managed at [facility]  . PPD positive 1993    Found at [facility]  . Spinal stenosis of lumbar region at multiple levels   . Proteinuria 08/07/2006    Qualifier: Diagnosis of  By: Haydee Salter    . Open angle with borderline findings and low glaucoma risk in both eyes 02/23/2013  . Regular astigmatism 02/23/2013  . Presbyopia 02/23/2013  . Ptosis of left eyelid 02/23/2013  . At risk for falling 10/04/2013  . GAIT DISTURBANCE 08/01/2008        . HYPERLIPIDEMIA 08/07/2006    Qualifier: Diagnosis of  By: Haydee Salter    . HYPERTENSION, BENIGN SYSTEMIC 08/07/2006    Qualifier: Diagnosis of  By: Haydee Salter    . IMPOTENCE, ORGANIC 08/07/2006    Qualifier: Diagnosis of  By: Haydee Salter    . LUMBAR SPRAIN AND STRAIN 07/19/2008    Qualifier: Diagnosis of  By: Mauricio Po MD, Fayrene Fearing    . Myopia with presbyopia  of left eye 02/23/2013  . OBESITY, NOS 08/07/2006    Qualifier: Diagnosis of  By: Haydee Salter    . OSTEOARTHRITIS, LOWER LEG 08/07/2006    Qualifier: Diagnosis of  By: Haydee Salter    . PERIPHERAL EDEMA 08/01/2008    Qualifier: Diagnosis of  By: Sheffield Slider MD, Deniece Portela    . Peripheral vascular disease, unspecified 04/21/2012    Severe tibial occlusive disease (Dr Hart Rochester, Vasc Surgery, 04/2002)     . Recurrent falls 10/04/2013  . Skin ulcer of foot, left heel 02/23/2013  . Type II or unspecified type diabetes mellitus without mention of complication, not stated as uncontrolled 08/07/2006    Qualifier: Diagnosis of  By: Haydee Salter    . Unspecified hereditary and idiopathic peripheral neuropathy 08/07/2006        . Proteinuria 08/07/2006  . Impairment of balance 05/10/2014  . Urge incontinence 05/10/2014  . Impaired mobility and ADLs 05/10/2014  . Diabetes mellitus due to underlying condition with diabetic polyneuropathy 08/07/2006    Qualifier: Diagnosis of  By: Haydee Salter    . Difficulty in voiding 08/22/2014  . 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 Chronic systolic heart failure 08/22/2014  . Mild to Moderate aortic regurgitation 08/22/2014  . Heart failure with reduced ejection fraction 08/22/2014  .  Discitis of lumbar region 08/22/2014    Hyperintense signal on Lumbar MRI 08/12/14 in L2-L3 disc.   Past Surgical History  Procedure Laterality Date  . Neck surgery  1993    s/p C1-2, C2-3 anterior cervical diskectomies, partial corpectomies through thoracotomy at [facility] 1993  . Cataract extraction      Cataract surgery   . Thoracotomy  1993    s/p T3-T4 osteomyelitis with multiple open drainages including thoracotomy at [facility] 1993  . Spinal cord decompression  1993    s/p T1-T2 spinal cord compression secondary to granualtion tissue and abscess material secondary to previous mediastinitis at [facility] 1993  . Jejunostomy feeding tube  1993    s/p Feeding jejunostomy [facility] 1993  . Esophageal perforation   1993    Hx of esophageal perforation complication 1993  . Lumbar epidural injection  2010    Epidural corticosteroid injection for L2-3 & L4-5 severe spinal stenosis at [facility] on 9/10, 10/10, 11/10    . Lumbar laminectomy/decompression microdiscectomy N/A 08/13/2014    Procedure: LUMBAR LAMINECTOMY ;  Surgeon: Shirlean Kelly, MD;  Location: [facility] NEURO ORS;  Service: Neurosurgery;  Laterality: N/A;  . Cysto N/A 09/02/2014    Procedure: CYSTO, clot evacuation;  Surgeon: Sebastian Ache, MD;  Location: [facility] ORS;  Service: Urology;  Laterality: N/A;   Family History  Problem Relation Age of Onset  . Diabetes Mother   . Diabetes Father   . Hypertension Father   . Other Father     amputaion  . Diabetes Son   . Hypertension Son    History  Substance Use Topics  . Smoking status: Former Smoker    Types: Cigarettes    Quit date: 04/22/1987  . Smokeless tobacco: Never Used  . Alcohol Use: No    Review of Systems  Unable to perform ROS: Acuity of condition    Allergies  Naprosyn; Rofecoxib; and Tramadol hcl  Home Medications   Prior to Admission medications   Medication Sig Start Date End Date Taking? Authorizing Provider  acetaminophen (TYLENOL) 500 MG tablet Take 500 mg by mouth every 6 (six) hours.    Historical Provider, MD  Ascorbic Acid (VITAMIN C) 500 MG CHEW Chew by mouth daily.      Historical Provider, MD  aspirin 81 MG tablet Take 81 mg by mouth daily.      Historical Provider, MD  bisacodyl (DULCOLAX) 10 MG suppository Place 10 mg rectally as needed for moderate constipation. For constipation unrelieved by milk of magnesia.    Historical Provider, MD  calcium-vitamin D (OSCAL WITH D 250-125) 250-125 MG-UNIT per tablet Take 1 tablet by mouth daily.      Historical Provider, MD  cefTRIAXone (ROCEPHIN) 40 MG/ML IVPB Inject 50 mLs (2 g total) into the vein daily. 08/19/14 09/27/14  Pincus Large, DO  Fish Oil OIL Take 1,000 mg by mouth daily.     Historical Provider, MD   food thickener (THICK IT) POWD Take 1 Container by mouth as needed (Per Dsyphagia 3 Diet, to make liquids Nectar Thi\ck). 08/18/14   Pincus Large, DO  gabapentin (NEURONTIN) 300 MG capsule Take 1 capsule (300 mg total) by mouth 2 (two) times daily. 08/18/14   Pincus Large, DO  insulin aspart (NOVOLOG) 100 UNIT/ML injection Inject 3 Units into the skin 3 (three) times daily with meals. 08/18/14   Pincus Large, DO  insulin glargine (LANTUS) 100 UNIT/ML injection Inject 0.2 mLs (20 Units total) into the skin at  bedtime. 08/18/14   Pincus Large, DO  lisinopril (PRINIVIL,ZESTRIL) 20 MG tablet Take 1 tablet (20 mg total) by mouth at bedtime. 02/09/14   Leighton Roach McDiarmid, MD  magnesium hydroxide (MILK OF MAGNESIA) 400 MG/5ML suspension Take 30 mLs by mouth daily as needed for mild constipation.    Historical Provider, MD  Multiple Vitamin (MULTIVITAMIN WITH MINERALS) TABS tablet Take 1 tablet by mouth daily.    Historical Provider, MD  Multiple Vitamin (MULTIVITAMIN) tablet Take 1 tablet by mouth daily.      Historical Provider, MD  nutrition supplement, JUVEN, (JUVEN) PACK Take 1 packet by mouth 2 (two) times daily. With 240 ml juice.    Historical Provider, MD  Nutritional Supplements (NUTRITIONAL SHAKE) LIQD Take 120 mLs by mouth 2 (two) times daily. [employer].    Historical Provider, MD  oxyCODONE (OXY IR/ROXICODONE) 5 MG immediate release tablet Take 0.5 tablets (2.5 mg total) by mouth every 4 (four) hours as needed for breakthrough pain. 09/08/14   Leighton Roach McDiarmid, MD  oxyCODONE (OXY IR/ROXICODONE) 5 MG immediate release tablet Take 5 mg by mouth 4 (four) times daily.    Historical Provider, MD  Pyridoxine HCl (VITAMIN B-6) 100 MG tablet 3 tabs once daily.     Historical Provider, MD  senna-docusate (SENOKOT-S) 8.6-50 MG per tablet Take 2 tablets by mouth 2 (two) times daily. 08/18/14   Pincus Large, DO  Sodium Phosphates (RA SALINE ENEMA RE) Place 1 enema rectally once as needed (for constipation  unrelieved by bisacodyl suppositories. Contact MD if no results.).    Historical Provider, MD  tamsulosin (FLOMAX) 0.4 MG CAPS capsule Take 0.4 mg by mouth daily.    Historical Provider, MD  vitamin B-12 (CYANOCOBALAMIN) 1000 MCG tablet Take 1,000 mcg by mouth daily.      Historical Provider, MD   BP 87/68 mmHg  Pulse 115  Resp 18  SpO2 100% Physical Exam  Constitutional: He appears well-developed and well-nourished.  Pt is unresponsive, ETT in place  HENT:  Head: Normocephalic and atraumatic.  Nose: Nose normal.  Mouth/Throat: Oropharynx is clear and moist.  Eyes:  Pupils are 4mm and unreactive  Neck: Normal range of motion. Neck supple. No JVD present. No tracheal deviation present. No thyromegaly present.  Cardiovascular: Exam reveals no gallop and no friction rub.   No murmur heard. Pt with irregular heart rate, distant heart sounds.  Bedside US without effusion, poor contractility.  Pulmonary/Chest: Effort normal and breath sounds normal. No stridor. No respiratory distress. He has no wheezes. He has no rales.  Pt being bagged, no respiratory effort.    Abdominal: Soft. Bowel sounds are normal. He exhibits mass (hernia to mid abdomen, soft, reducible). He exhibits no distension.  Genitourinary:  Foley in place  Musculoskeletal: He exhibits no edema.  Lymphadenopathy:    He has no cervical adenopathy.  Skin: Skin is warm and dry. No rash noted. No erythema. No pallor.  Nursing note and vitals reviewed.   ED Course  Procedures   DIAGNOSTIC STUDIES:       Labs Review Labs Reviewed  CBC - Abnormal; Notable for the following:    RBC 2.77 (*)    Hemoglobin 7.4 (*)    HCT 23.7 (*)    RDW 17.7 (*)    All other components within normal limits  APTT - Abnormal; Notable for the following:    aPTT 56 (*)    All other components within normal limits  BASIC METABOLIC PANEL -  Abnormal; Notable for the following:    Sodium 149 (*)    Potassium 3.2 (*)    Chloride 117 (*)     Glucose, Bld 147 (*)    Calcium 7.7 (*)    GFR calc non Af Amer 51 (*)    GFR calc Af Amer 59 (*)    All other components within normal limits  PROTIME-INR - Abnormal; Notable for the following:    Prothrombin Time 18.6 (*)    INR 1.54 (*)    All other components within normal limits  LACTIC ACID, PLASMA - Abnormal; Notable for the following:    Lactic Acid, Venous 8.8 (*)    All other components within normal limits  PHOSPHORUS - Abnormal; Notable for the following:    Phosphorus 6.7 (*)    All other components within normal limits  I-STAT CG4 LACTIC ACID, ED - Abnormal; Notable for the following:    Lactic Acid, Venous 8.74 (*)    All other components within normal limits  CULTURE, BLOOD (ROUTINE X 2)  CULTURE, BLOOD (ROUTINE X 2)  MRSA PCR SCREENING  TROPONIN I  MAGNESIUM  TROPONIN I  TROPONIN I  LACTIC ACID, PLASMA  LACTIC ACID, PLASMA  CORTISOL  PROCALCITONIN  BASIC METABOLIC PANEL  PROCALCITONIN  CALCIUM, IONIZED  TSH  CBG MONITORING, ED  I-STAT CG4 LACTIC ACID, ED  I-STAT CG4 LACTIC ACID, ED    Imaging Review Dg Chest Port 1 View  09/14/2014   CLINICAL DATA:  OG and ETT placement.  EXAM: PORTABLE CHEST - 1 VIEW  COMPARISON:  09/09/2014  FINDINGS: Endotracheal tube placed with tip measuring 2.2 cm above the carina. Enteric tube tip is off the field of view below the left hemidiaphragm. Proximal side hole is projected over the upper stomach. Right PICC catheter tip is over the low SVC. No pneumothorax. Atelectasis in the right lung base. No focal consolidation. No blunting of costophrenic angles. Mediastinal contours appear intact. Calcified and tortuous aorta.  IMPRESSION: Appliances appear to be in satisfactory location. Atelectasis in the right lung base.   Electronically Signed   By: Burman NievesWilliam  Stevens M.D.   On: 19-Jun-2014 03:50     EKG Interpretation   Date/Time:  Friday September 16 2014 03:15:23 EDT Ventricular Rate:  126 PR Interval:    QRS Duration: 95 QT  Interval:  321 QTC Calculation: 465 R Axis:   -50 Text Interpretation:  Atrial fibrillation Ventricular premature complex  Left anterior fascicular block Low voltage, extremity and precordial leads  Repol abnrm suggests ischemia, diffuse leads Confirmed by Mikle Sternberg  MD, Sanjay Broadfoot  ([id number]) on 10/01/2014 4:15:19 AM     CRITICAL CARE Performed by: Olivia MackieTTER,Wane Mollett M Total critical care time:60 min Critical care time was exclusive of separately billable procedures and treating other patients. Critical care was necessary to treat or prevent imminent or life-threatening deterioration. Critical care was time spent personally by me on the following activities: development of treatment plan with patient and/or surrogate as well as nursing, discussions with consultants, evaluation of patient's response to treatment, examination of patient, obtaining history from patient or surrogate, ordering and performing treatments and interventions, ordering and review of laboratory studies, ordering and review of radiographic studies, pulse oximetry and re-evaluation of patient's condition.  MDM   Final diagnoses:  Idiopathic cardiac arrest    I personally performed the services described in this documentation, which was scribed in my presence. The recorded information has been reviewed and is accurate.  81 year old male with cardiac arrest  in nursing home.  There is a 2 hour window between last seen alive and being found unresponsive/ in cardiac arrest.  Return of circulation after 5 epis and CPR approximately 30 minutes after being found down.  Patient without gag, not breathing over the vent with fixed pupils, no corneal reflex.  Very poor prognosis.  Pt requiring dopamine and levophed for BP support.  Pt to be admitted to ICU, family contacted by critical care and are on their way to the ER.     Marisa Severin, MD 09/22/14 [phone]

## 2014-10-09 NOTE — Progress Notes (Signed)
Nutrition Brief Note  Chart reviewed. Pt transitioning to comfort care when remainder of family arrives.  No further nutrition interventions warranted at this time.  Please re-consult as needed.   Emmaline KluverHaley Lum Stillinger MS, RD, LDN [phone]

## 2014-10-09 NOTE — Progress Notes (Addendum)
Critical lab value lactic acid- 8.8 called by lab tech. No change from previous lactic acid. Will continue to monitor.

## 2014-10-09 NOTE — Telephone Encounter (Signed)
Received call from [facility] that patient had been noted to have no respirations or pulse and EMS was called. Nurse states that EMS has been working on him and got a heart beat back. He will be transferred to the ED for further evaluation. [facility].

## 2014-10-09 DEATH — deceased

## 2015-12-16 IMAGING — US DG FLUORO GUIDE CV LINE
1 series · 2 of 2 positions shown · IV contrast (agent unspecified)
Comparison: none

INDICATION: Bacteremia. Poor venous access. In need of durable intravenous
access for antibiotic administration

EXAM:
ULTRASOUND AND FLUOROSCOPIC GUIDED PICC LINE INSERTION
MEDICATIONS:
None.
CONTRAST:  None
FLUOROSCOPY TIME:  6 seconds (2 mGy)
COMPLICATIONS:
None immediate
TECHNIQUE: The procedure, risks, benefits, and alternatives were explained to
the patient and informed written consent was obtained. A timeout was
performed prior to the initiation of the procedure.

[Series 1: ir fluoro guide cv midline picc*right* · 2 of 2 slices shown]
[im 1/2]
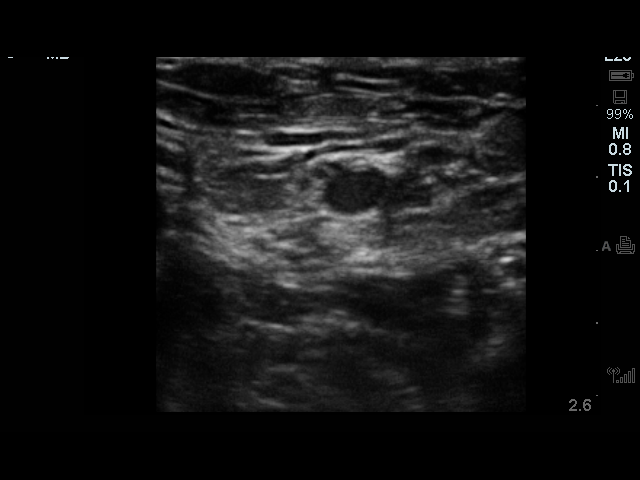
[im 2/2]
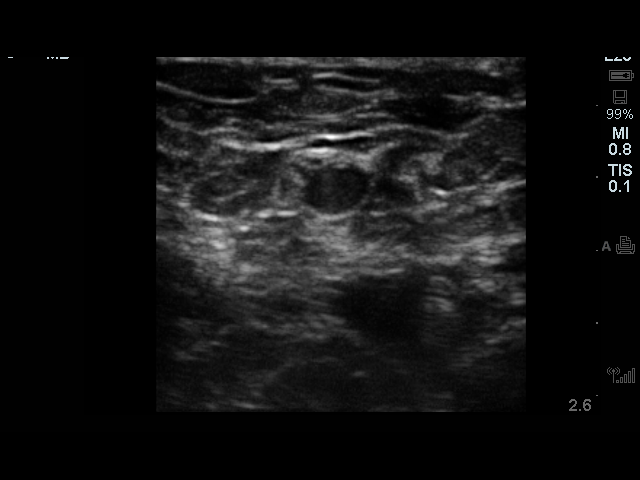

[2 of 2 positions shown; findings below may reference images not displayed]

The right upper extremity was prepped with chlorhexidine in a
sterile fashion, and a sterile drape was applied covering the
operative field. Maximum barrier sterile technique with sterile
gowns and gloves were used for the procedure. A timeout was
performed prior to the initiation of the procedure. Local anesthesia
was provided with 1% lidocaine.

Under direct ultrasound guidance, the right basilic vein was
accessed with a micropuncture kit after the overlying soft tissues
were anesthetized with 1% lidocaine. An ultrasound image was saved
for documentation purposes. A guidewire was advanced to the level of
the superior caval-atrial junction for measurement purposes and the
PICC line was cut to length. A peel-away sheath was placed and a 41
cm, 5 French, single lumen was inserted to level of the superior
caval-atrial junction. A post procedure spot fluoroscopic was
obtained. The catheter easily aspirated and flushed and was sutured
in place. A dressing was placed. The patient tolerated the procedure
well without immediate post procedural complication.
FINDINGS: After catheter placement, the tip lies within the superior
cavoatrial junction. The catheter aspirates and flushes normally and
is ready for immediate use.
IMPRESSION: Successful ultrasound and fluoroscopic guided placement of a right
basilic vein approach, 41 cm, 5 French, single lumen PICC with tip
at the superior caval-atrial junction. The PICC line is ready for
immediate use.

## 2015-12-24 IMAGING — XA IR US GUIDE VASC ACCESS RIGHT
1 series · 2 of 2 positions shown · non-contrast
Comparison: none

CLINICAL DATA: Poor venous access.  Request PICC line placement.

EXAM:
IR RIGHT FLUORO GUIDE CV LINE; IR ULTRASOUND GUIDANCE VASC ACCESS
RIGHT
FLUOROSCOPY TIME:  6 seconds.
TECHNIQUE: The right arm was prepped with chlorhexidine, draped in the usual
sterile fashion using maximum barrier technique (cap and mask,
sterile gown, sterile gloves, large sterile sheet, hand hygiene and
cutaneous antiseptic). Local anesthesia was attained by infiltration
with 1% lidocaine.

[Series 1: run · 2 of 2 slices shown]
[im 1/2]
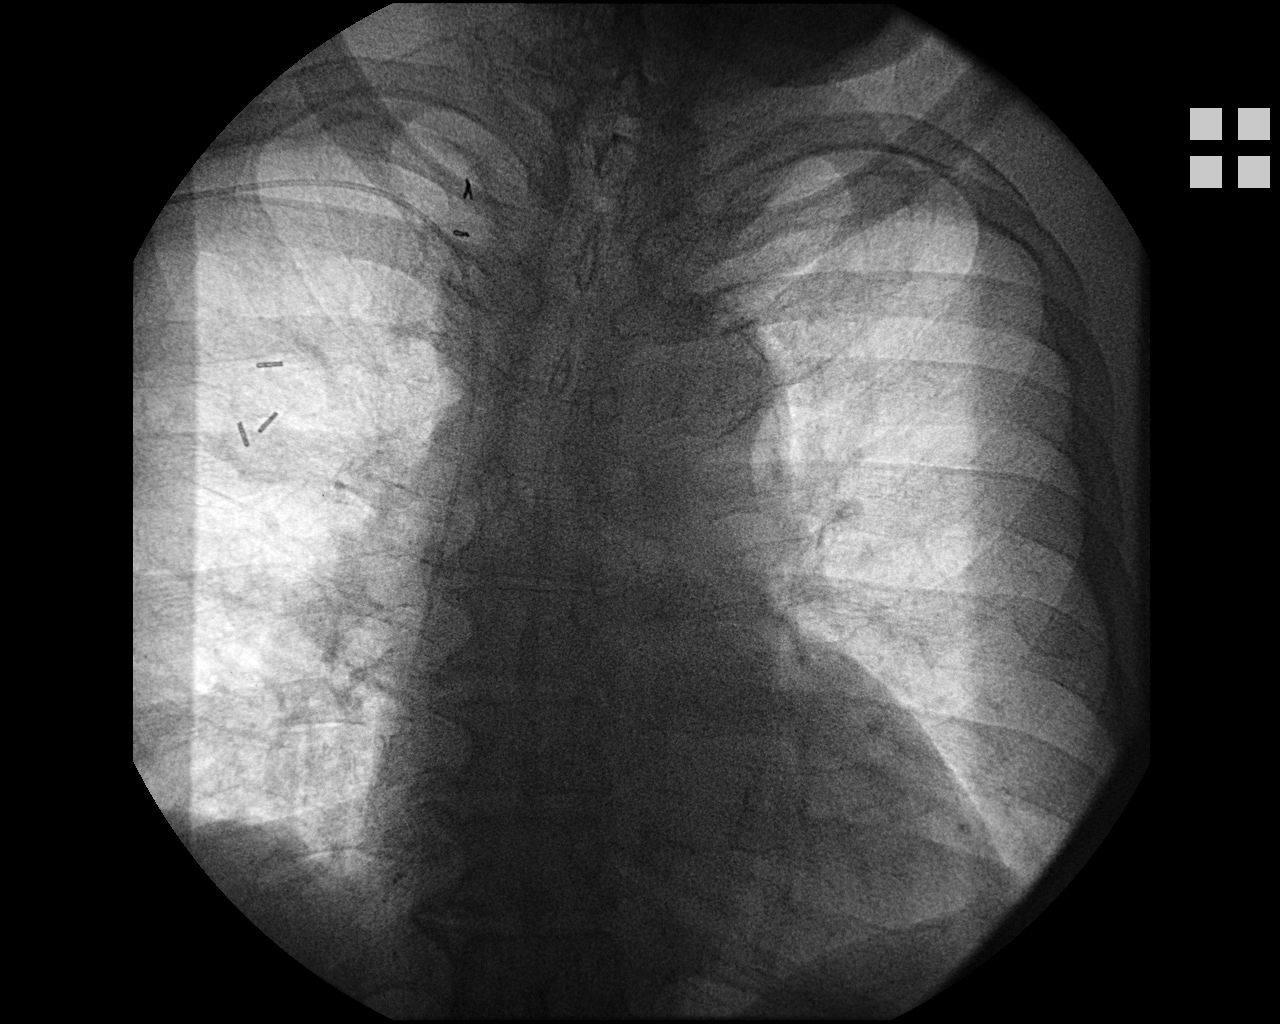
[im 2/2]
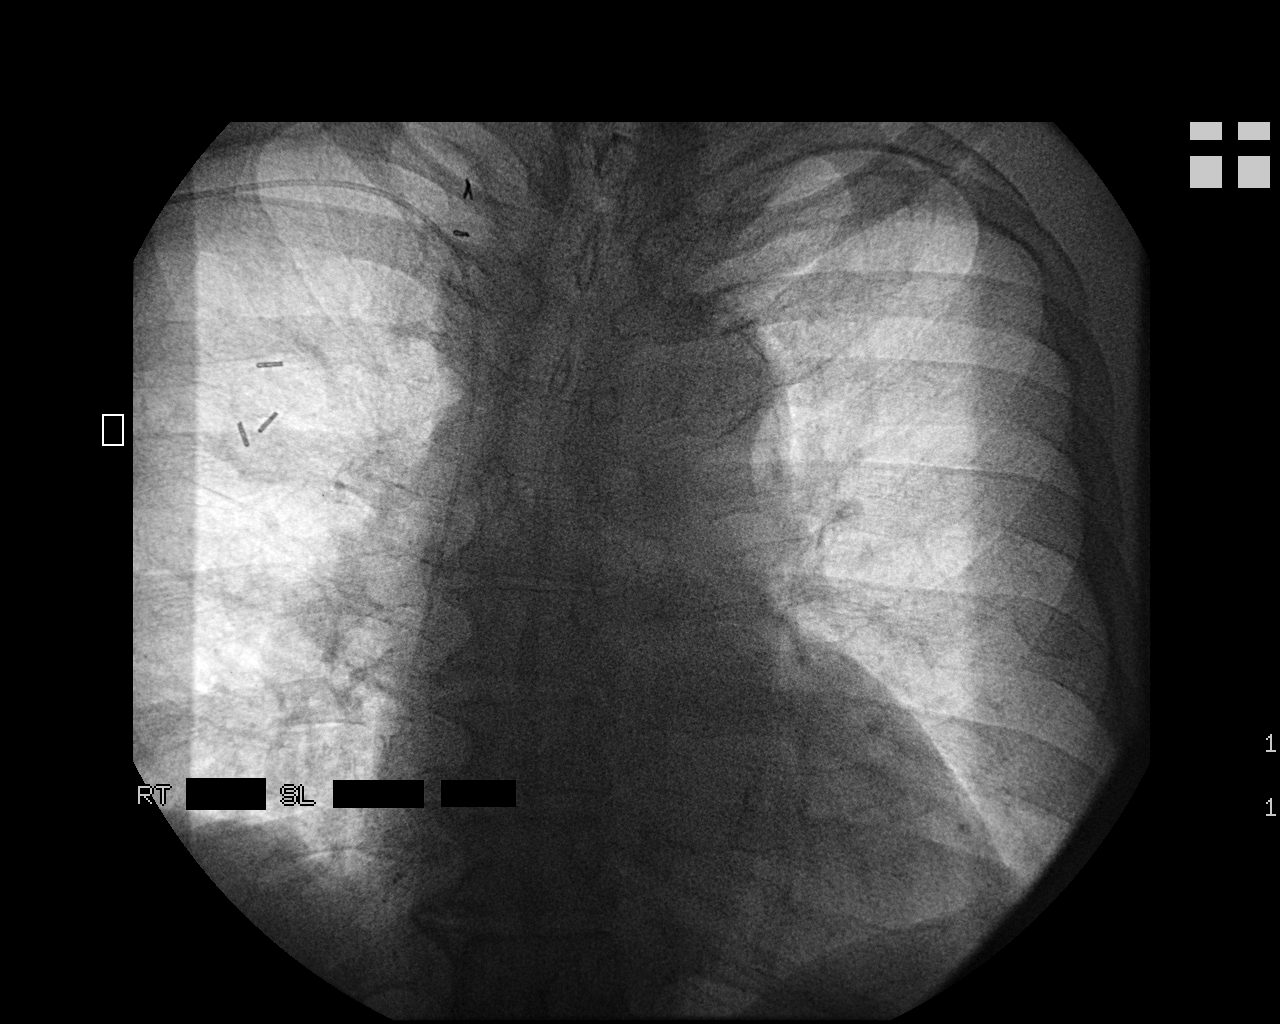

[2 of 2 positions shown; findings below may reference images not displayed]

Ultrasound demonstrated patency of the basilic vein, and this was
documented with an image. Under real-time ultrasound guidance, this
vein was accessed with a 21 gauge micropuncture needle and image
documentation was performed. The needle was exchanged over a
guidewire for a peel-away sheath through which a 40 cm 5 French
single lumen power injectable PICC was advanced, and positioned with
its tip at the lower SVC/right atrial junction. Fluoroscopy during
the procedure and fluoro spot radiograph confirms appropriate
catheter position. The catheter was flushed, secured to the skin
with Prolene sutures, and covered with a sterile dressing.

COMPLICATIONS:
None.  The patient tolerated the procedure well.
IMPRESSION: Successful placement of a right arm PICC with sonographic and
fluoroscopic guidance. The catheter is ready for use.

Read by Glen Mark Mamauag

## 2016-01-09 IMAGING — CR DG CHEST 1V PORT
1 series · 1 of 1 positions shown · non-contrast
Comparison: 09/09/2014

CLINICAL DATA: OG and ETT placement.

EXAM:
PORTABLE CHEST - 1 VIEW

[AP]
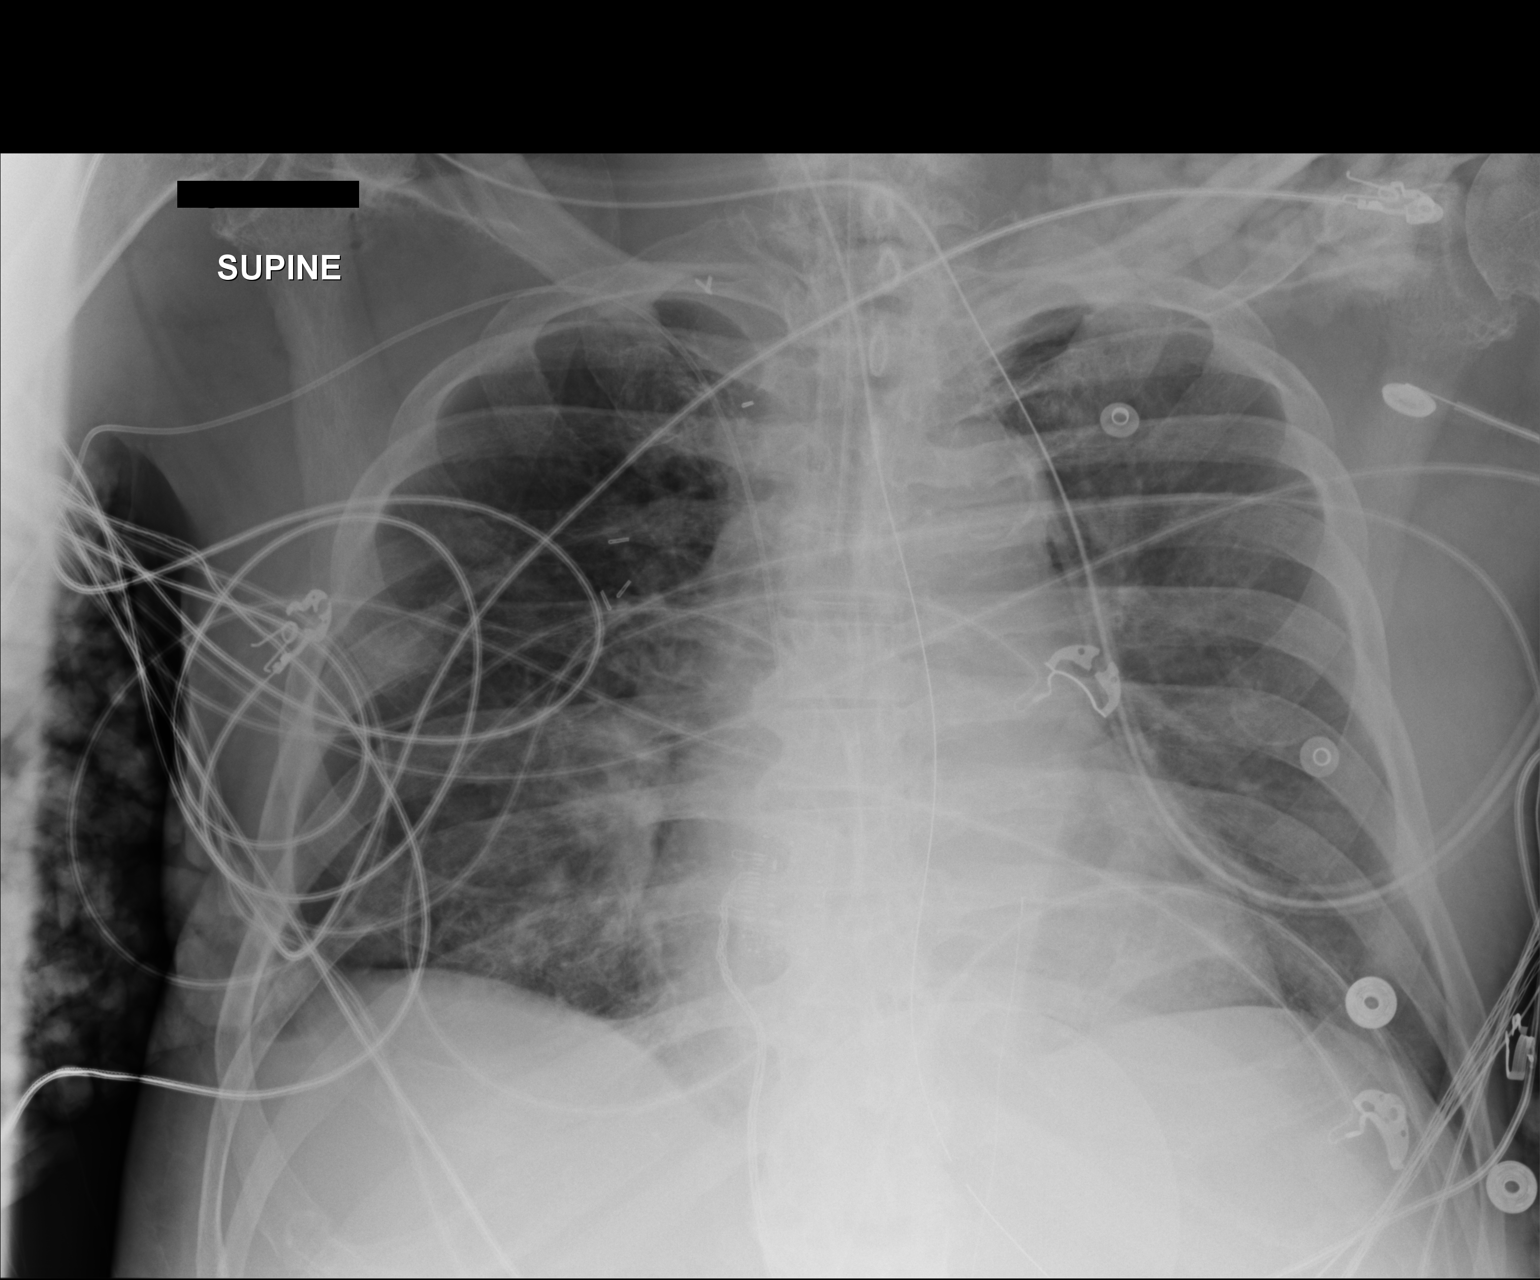

[1 of 1 positions shown; findings below may reference images not displayed]

FINDINGS: Endotracheal tube placed with tip measuring 2.2 cm above the carina.
Enteric tube tip is off the field of view below the left
hemidiaphragm. Proximal side hole is projected over the upper
stomach. Right PICC catheter tip is over the low SVC. No
pneumothorax. Atelectasis in the right lung base. No focal
consolidation. No blunting of costophrenic angles. Mediastinal
contours appear intact. Calcified and tortuous aorta.
IMPRESSION: Appliances appear to be in satisfactory location. Atelectasis in the
right lung base.

## 2016-03-18 MED ORDER — GABAPENTIN 100 MG PO CAPS: 100.0000 mg | ORAL_CAPSULE | Freq: Three times a day (TID) | ORAL | 3 refills | Status: AC

## 2016-03-18 NOTE — Addendum Note (Signed)
Addended byPerley Jain: Bird Swetz D on: 03/18/2016 10:54 AM   Modules accepted: Orders
